# Patient Record
Sex: Female | Born: 1945 | Race: Black or African American | Hispanic: No | Smoking: Never smoker
Health system: Southern US, Community
[De-identification: ages and names within clinical notes are randomized; demographics above are authoritative.]

## PROBLEM LIST (undated history)

## (undated) DIAGNOSIS — E079 Disorder of thyroid, unspecified: Secondary | ICD-10-CM

## (undated) DIAGNOSIS — D126 Benign neoplasm of colon, unspecified: Secondary | ICD-10-CM

## (undated) DIAGNOSIS — K5730 Diverticulosis of large intestine without perforation or abscess without bleeding: Secondary | ICD-10-CM

## (undated) DIAGNOSIS — N3946 Mixed incontinence: Secondary | ICD-10-CM

## (undated) DIAGNOSIS — G4733 Obstructive sleep apnea (adult) (pediatric): Secondary | ICD-10-CM

## (undated) DIAGNOSIS — I10 Essential (primary) hypertension: Secondary | ICD-10-CM

## (undated) DIAGNOSIS — F209 Schizophrenia, unspecified: Secondary | ICD-10-CM

## (undated) DIAGNOSIS — F319 Bipolar disorder, unspecified: Secondary | ICD-10-CM

## (undated) DIAGNOSIS — I639 Cerebral infarction, unspecified: Secondary | ICD-10-CM

## (undated) HISTORY — DX: Essential (primary) hypertension: I10

## (undated) HISTORY — DX: Disorder of thyroid, unspecified: E07.9

## (undated) HISTORY — PX: MULTIPLE TOOTH EXTRACTIONS: SHX2053

## (undated) HISTORY — PX: ABDOMINAL HYSTERECTOMY: SHX81

## (undated) HISTORY — DX: Diverticulosis of large intestine without perforation or abscess without bleeding: K57.30

## (undated) HISTORY — DX: Mixed incontinence: N39.46

## (undated) HISTORY — DX: Benign neoplasm of colon, unspecified: D12.6

## (undated) HISTORY — DX: Bipolar disorder, unspecified: F31.9

## (undated) HISTORY — DX: Schizophrenia, unspecified: F20.9

## (undated) HISTORY — DX: Cerebral infarction, unspecified: I63.9

## (undated) HISTORY — PX: OTHER SURGICAL HISTORY: SHX169

## (undated) HISTORY — DX: Obstructive sleep apnea (adult) (pediatric): G47.33

---

## 2000-12-18 ENCOUNTER — Ambulatory Visit (HOSPITAL_COMMUNITY): Admission: RE | Admit: 2000-12-18 | Discharge: 2000-12-18 | Payer: Self-pay | Admitting: Family Medicine

## 2000-12-18 ENCOUNTER — Encounter: Payer: Self-pay | Admitting: Family Medicine

## 2001-12-24 ENCOUNTER — Ambulatory Visit (HOSPITAL_COMMUNITY): Admission: RE | Admit: 2001-12-24 | Discharge: 2001-12-24 | Payer: Self-pay | Admitting: Family Medicine

## 2001-12-24 ENCOUNTER — Encounter: Payer: Self-pay | Admitting: Family Medicine

## 2002-02-12 ENCOUNTER — Encounter: Payer: Self-pay | Admitting: Emergency Medicine

## 2002-02-12 ENCOUNTER — Inpatient Hospital Stay (HOSPITAL_COMMUNITY): Admission: EM | Admit: 2002-02-12 | Discharge: 2002-02-22 | Payer: Self-pay | Admitting: Psychiatry

## 2002-03-03 ENCOUNTER — Encounter: Payer: Self-pay | Admitting: Family Medicine

## 2002-03-03 ENCOUNTER — Ambulatory Visit (HOSPITAL_COMMUNITY): Admission: RE | Admit: 2002-03-03 | Discharge: 2002-03-03 | Payer: Self-pay | Admitting: Family Medicine

## 2002-12-30 ENCOUNTER — Ambulatory Visit (HOSPITAL_COMMUNITY): Admission: RE | Admit: 2002-12-30 | Discharge: 2002-12-30 | Payer: Self-pay | Admitting: Family Medicine

## 2002-12-30 ENCOUNTER — Encounter: Payer: Self-pay | Admitting: Family Medicine

## 2004-03-10 ENCOUNTER — Ambulatory Visit (HOSPITAL_COMMUNITY): Admission: RE | Admit: 2004-03-10 | Discharge: 2004-03-10 | Payer: Self-pay | Admitting: Ophthalmology

## 2004-03-15 ENCOUNTER — Ambulatory Visit (HOSPITAL_COMMUNITY): Admission: RE | Admit: 2004-03-15 | Discharge: 2004-03-15 | Payer: Self-pay | Admitting: Family Medicine

## 2005-03-13 HISTORY — PX: COLONOSCOPY: SHX174

## 2005-03-17 ENCOUNTER — Ambulatory Visit (HOSPITAL_COMMUNITY): Admission: RE | Admit: 2005-03-17 | Discharge: 2005-03-17 | Payer: Self-pay | Admitting: Family Medicine

## 2005-04-05 ENCOUNTER — Ambulatory Visit (HOSPITAL_COMMUNITY): Admission: RE | Admit: 2005-04-05 | Discharge: 2005-04-05 | Payer: Self-pay | Admitting: Family Medicine

## 2005-06-26 ENCOUNTER — Ambulatory Visit (HOSPITAL_COMMUNITY): Admission: RE | Admit: 2005-06-26 | Discharge: 2005-06-26 | Payer: Self-pay | Admitting: Ophthalmology

## 2005-11-29 ENCOUNTER — Ambulatory Visit (HOSPITAL_COMMUNITY): Admission: RE | Admit: 2005-11-29 | Discharge: 2005-11-29 | Payer: Self-pay | Admitting: Internal Medicine

## 2005-11-29 ENCOUNTER — Ambulatory Visit: Payer: Self-pay | Admitting: Internal Medicine

## 2006-03-19 ENCOUNTER — Ambulatory Visit (HOSPITAL_COMMUNITY): Admission: RE | Admit: 2006-03-19 | Discharge: 2006-03-19 | Payer: Self-pay | Admitting: Family Medicine

## 2006-12-02 ENCOUNTER — Ambulatory Visit: Admission: RE | Admit: 2006-12-02 | Discharge: 2006-12-02 | Payer: Self-pay | Admitting: Family Medicine

## 2006-12-11 ENCOUNTER — Ambulatory Visit: Payer: Self-pay | Admitting: Pulmonary Disease

## 2007-03-22 ENCOUNTER — Ambulatory Visit (HOSPITAL_COMMUNITY): Admission: RE | Admit: 2007-03-22 | Discharge: 2007-03-22 | Payer: Self-pay | Admitting: Family Medicine

## 2007-11-06 ENCOUNTER — Inpatient Hospital Stay (HOSPITAL_COMMUNITY): Admission: RE | Admit: 2007-11-06 | Discharge: 2007-11-11 | Payer: Self-pay | Admitting: Family Medicine

## 2008-03-13 DIAGNOSIS — E079 Disorder of thyroid, unspecified: Secondary | ICD-10-CM

## 2008-03-13 HISTORY — DX: Disorder of thyroid, unspecified: E07.9

## 2008-12-15 ENCOUNTER — Ambulatory Visit (HOSPITAL_COMMUNITY): Admission: RE | Admit: 2008-12-15 | Discharge: 2008-12-15 | Payer: Self-pay | Admitting: Family Medicine

## 2009-01-08 ENCOUNTER — Encounter: Payer: Self-pay | Admitting: Emergency Medicine

## 2009-01-08 ENCOUNTER — Ambulatory Visit: Payer: Self-pay | Admitting: Psychiatry

## 2009-01-09 ENCOUNTER — Inpatient Hospital Stay (HOSPITAL_COMMUNITY): Admission: AD | Admit: 2009-01-09 | Discharge: 2009-02-01 | Payer: Self-pay | Admitting: Psychiatry

## 2010-04-03 ENCOUNTER — Encounter: Payer: Self-pay | Admitting: Family Medicine

## 2010-06-15 LAB — GLUCOSE, CAPILLARY
Glucose-Capillary: 100 mg/dL — ABNORMAL HIGH (ref 70–99)
Glucose-Capillary: 104 mg/dL — ABNORMAL HIGH (ref 70–99)
Glucose-Capillary: 118 mg/dL — ABNORMAL HIGH (ref 70–99)
Glucose-Capillary: 119 mg/dL — ABNORMAL HIGH (ref 70–99)
Glucose-Capillary: 89 mg/dL (ref 70–99)
Glucose-Capillary: 91 mg/dL (ref 70–99)
Glucose-Capillary: 92 mg/dL (ref 70–99)
Glucose-Capillary: 101 mg/dL — ABNORMAL HIGH (ref 70–99)
Glucose-Capillary: 104 mg/dL — ABNORMAL HIGH (ref 70–99)
Glucose-Capillary: 104 mg/dL — ABNORMAL HIGH (ref 70–99)
Glucose-Capillary: 106 mg/dL — ABNORMAL HIGH (ref 70–99)
Glucose-Capillary: 109 mg/dL — ABNORMAL HIGH (ref 70–99)
Glucose-Capillary: 110 mg/dL — ABNORMAL HIGH (ref 70–99)
Glucose-Capillary: 114 mg/dL — ABNORMAL HIGH (ref 70–99)
Glucose-Capillary: 117 mg/dL — ABNORMAL HIGH (ref 70–99)
Glucose-Capillary: 119 mg/dL — ABNORMAL HIGH (ref 70–99)
Glucose-Capillary: 119 mg/dL — ABNORMAL HIGH (ref 70–99)
Glucose-Capillary: 120 mg/dL — ABNORMAL HIGH (ref 70–99)
Glucose-Capillary: 135 mg/dL — ABNORMAL HIGH (ref 70–99)
Glucose-Capillary: 137 mg/dL — ABNORMAL HIGH (ref 70–99)
Glucose-Capillary: 137 mg/dL — ABNORMAL HIGH (ref 70–99)
Glucose-Capillary: 75 mg/dL (ref 70–99)
Glucose-Capillary: 80 mg/dL (ref 70–99)
Glucose-Capillary: 87 mg/dL (ref 70–99)
Glucose-Capillary: 92 mg/dL (ref 70–99)
Glucose-Capillary: 93 mg/dL (ref 70–99)
Glucose-Capillary: 93 mg/dL (ref 70–99)
Glucose-Capillary: 94 mg/dL (ref 70–99)
Glucose-Capillary: 95 mg/dL (ref 70–99)
Glucose-Capillary: 96 mg/dL (ref 70–99)
Glucose-Capillary: 99 mg/dL (ref 70–99)

## 2010-06-16 LAB — URINE CULTURE: Colony Count: >=100000

## 2010-06-16 LAB — CBC
HCT: 41.6 % (ref 36.0–46.0)
MCV: 82.6 fL (ref 78.0–100.0)
Platelets: 264 10*3/uL (ref 150–400)
RDW: 15.7 % — ABNORMAL HIGH (ref 11.5–15.5)

## 2010-06-16 LAB — RAPID URINE DRUG SCREEN, HOSP PERFORMED
Amphetamines: NOT DETECTED
Benzodiazepines: NOT DETECTED
Cocaine: NOT DETECTED
Opiates: NOT DETECTED

## 2010-06-16 LAB — DIFFERENTIAL
Basophils Absolute: 0 10*3/uL (ref 0.0–0.1)
Lymphocytes Relative: 27 % (ref 12–46)
Monocytes Absolute: 0.6 10*3/uL (ref 0.1–1.0)
Neutro Abs: 5.7 10*3/uL (ref 1.7–7.7)

## 2010-06-16 LAB — COMPREHENSIVE METABOLIC PANEL
Albumin: 3.7 g/dL (ref 3.5–5.2)
BUN: 26 mg/dL — ABNORMAL HIGH (ref 6–23)
Chloride: 106 mEq/L (ref 96–112)
Creatinine, Ser: 1.57 mg/dL — ABNORMAL HIGH (ref 0.4–1.2)
Total Bilirubin: 0.3 mg/dL (ref 0.3–1.2)
Total Protein: 7 g/dL (ref 6.0–8.3)

## 2010-06-16 LAB — URINALYSIS, ROUTINE W REFLEX MICROSCOPIC
Glucose, UA: NEGATIVE mg/dL
Hgb urine dipstick: NEGATIVE
Protein, ur: NEGATIVE mg/dL

## 2010-06-16 LAB — URINE MICROSCOPIC-ADD ON

## 2010-06-16 LAB — GLUCOSE, CAPILLARY
Glucose-Capillary: 126 mg/dL — ABNORMAL HIGH (ref 70–99)
Glucose-Capillary: 188 mg/dL — ABNORMAL HIGH (ref 70–99)

## 2010-07-26 NOTE — Discharge Summary (Signed)
[name], [name]              ACCOUNT NO.:  [id number]   MEDICAL RECORD NO.:  [id number]          PATIENT TYPE:  INP   LOCATION:  A313                          FACILITY:  [facility]   PHYSICIAN:  Mila Homer. Sudie Bailey, M.D.DATE OF BIRTH:  1946/03/11   DATE OF ADMISSION:  11/06/2007  DATE OF DISCHARGE:  LH                               DISCHARGE SUMMARY   This 65 year old is admitted to the hospital with acute shortness of  breath.  She had a benign 6-day hospitalization extending from Aug 06, 2007 to November 11, 2007.  Vital signs remained stable.   Admission white cell count done through the office showed a white cell  count 12,100 of which 73% granulocytes and 12 lymphs.  BMP showed  potassium 3.1, chloride 94, glucose 196, BUN 29, creatinine 2.44, and D-  dimer 3.45.  Recheck white cell count, the third day was 8800, H&H 10.1  and 71.1.  She had a normal diff.  BMET at that time showed BUN 18,  creatinine 1.60, glucose 165, and potassium 3.5.  ABGs on admission, on  room air showed pH 7.45, pCO2 of 45, pO2 55, and bicarb 31.  O2 sat was  88% at time.   Admission chest x-ray showed cardiomegaly and mild bronchitic changes.  The repeat chest 2 days later showed what was felt to be increased  bibasilar atelectasis.  Pulmonary perfusion nuclear medicine study  showed normal perfusion.  Ventilation exam was not performed at that  time because the patient was on H1N1 precautions.   She was admitted with a diagnosis of possible pulmonary emboli.  She had  vital signs q.4 h. IV normal saline 75 mL/hour and put on Lovenox 1  mg/kg subcu q.12 h., albuterol neb treatments q.4 h. while awake,  Rocephin 1 g IV q.24 h., and Tamiflu 75 mg b.i.d., 5- day course.  She  was given O2 at 2 L to maintain her O2 sat greater than 90%.   She was also given, started the next day, Levaquin 500 mg daily and kept  on Evista 60 mg daily, simvastatin 40 mg daily, and lisinopril  hydrochlorothiazide 20/25  daily.   She was also continued on Abilify 5 mg daily, Depakote 500 mg nightly  and 250 mg b.i.d., Furosemide 40 mg daily, Metformin 1000 mg q.a.m. and  metformin 500 mg nightly, Kay Ciel 20 mEq daily, simvastatin 40 mg  daily, and amlodipine 10 mg daily.   The patient did well on this regimen.  She gradually improved.  Expiratory wheezing cleared.  She is moving air well.  The pulmonary  perfusion study came back negative.  The Lovenox was changed from b.i.d.  to just daily prophylactically.  She is ready for discharge home on her  sixth hospital day, November 11, 2007.   FINAL DISCHARGE DIAGNOSES:  1. Acute dyspnea probably secondary to viral illness.  2. Type 2 diabetes.  3. Benign essential hypertension.  4. Hyperglycemia.  5. Obesity.  6. Sleep apnea.  7. Bipolar 1 disease.  8. Hypothyroidism.   She is discharged home on Levaquin 500 mg daily for  5 days.  She is to  continue her Fosamax 70 mg weekly, Os-Cal 500 plus D b.i.d., EC-ASA 81  mg daily, furosemide 40 mg daily, Depakote 250 mg b.i.d. and Depakote  500 mg nightly, metformin ER 1000 mg q.a.m. and 500 mg nightly, Kay Ciel  20 mEq daily, Caduet 10/20 daily, lisinopril hydrochlorothiazide 20/25  daily, and Evista 60 mg daily.  Follow up with the office in 2 days.  At  that time, we will see when the results of the intranasal flu test will  be back.       Mila Homer. Sudie Bailey, M.D.  Electronically Signed     SDK/MEDQ  D:  11/11/2007  T:  11/11/2007  Job:  [id number]

## 2010-07-26 NOTE — Procedures (Signed)
NAMEMarland Kitchen  Cheryl, Parrish              ACCOUNT NO.:  [id number]   MEDICAL RECORD NO.:  [id number]          PATIENT TYPE:  OUT   LOCATION:  SLEEP LAB                     FACILITY:  [facility]   PHYSICIAN:  Barbaraann Share, MD,FCCPDATE OF BIRTH:  04-17-44   DATE OF STUDY:  12/02/2006                            NOCTURNAL POLYSOMNOGRAM   REFERRING PHYSICIAN:  Mila Homer. Sudie Bailey, M.D.   INDICATION FOR STUDY:  Hypersomnia with sleep apnea.   EPWORTH SLEEPINESS SCORE:  15.   SLEEP ARCHITECTURE:  The patient had a total sleep time of 332 minutes  with adequate slow wave sleep but decreased REM.  Sleep onset latency  was at the upper limits of normal, and REM onset was fairly rapid at 22  minutes.  Sleep efficiency was decreased at 87%.   RESPIRATORY DATA:  The patient was found to have 123 obstructive  hypopneas and 61 obstructive apneas for an apnea/hypopnea index of 33  events per hour.  The events occurred primarily in the supine position  as well as REM.  There was very loud snoring noted throughout.  The  patient did not meet split night protocol secondary to most of the  events occurring after 2 a.m.   OXYGEN DATA:  There was O2 desaturation as low as 76% with the patient's  more severe obstructive events.   CARDIAC DATA:  Rare PVC noted but no clinically significant arrhythmia.   MOVEMENT-PARASOMNIA:  None.   IMPRESSIONS-RECOMMENDATIONS:  Moderate obstructive sleep apnea/hypopnea  syndrome with an Apnea/hypopnea index of 33 events per hour and O2  desaturation as low as 76%.  The patient did not meet split night  criteria secondary to most of the events occurring after 2 a.m.  Treatment for this degree of sleep apnea should primarily focus on  weight loss as well as CPAP.  Other therapies at this severity would be  unlikely to succeed.      Barbaraann Share, MD,FCCP  Diplomate, American Board of Sleep  Medicine  Electronically Signed    K[facility]/MEDQ  D:  12/12/2006 08:44:36  T:   12/12/2006 12:17:42  Job:  [id number]

## 2010-07-26 NOTE — Group Therapy Note (Signed)
NAMEPAETON, LATOUCHE              ACCOUNT NO.:  [id number]   MEDICAL RECORD NO.:  [id number]          PATIENT TYPE:  INP   LOCATION:  A313                          FACILITY:  [facility]   PHYSICIAN:  Mila Homer. Sudie Bailey, M.D.DATE OF BIRTH:  [birth year]   DATE OF PROCEDURE:  DATE OF DISCHARGE:                                 PROGRESS NOTE   SUBJECTIVE:  Generally she is feeling better.  She says she feels like  she is coming out from under a cloud.   OBJECTIVE:  The temperature 101.4, pulse 101, respiratory rate 20, and  blood pressure 125/77.  She is sitting up.  No acute distress.  Well  developed and obese.  Lungs are clear throughout today.  She is moving  air well.  No intercostal retractions.  No use of accessory muscle  respiration.  The heart is regular rhythm, rate of 90s sitting.  There  is no edema of the ankles.  O2 sats have been 96% and 93% 2 L.   LABORATORY DATA:  White cell count today is 8800 of which 60%  neutrophils, 21 lymphs, 18 monos, H&H 10.1/31.1.  Sodium is 146, bicarb  35, glucose 165, BUN 18, creatinine 1.60 with estimated GFR of 39.   ASSESSMENT:  1. Probable pneumonia.  2. Question pulmonary emboli.   PLAN:  We are still waiting for the report on her VQ scan.  Meanwhile,  she is on Lovenox full dose and she is on IV antibiotics.  Repeat chest  x-ray is also pending.      Mila Homer. Sudie Bailey, M.D.  Electronically Signed     SDK/MEDQ  D:  11/08/2007  T:  11/08/2007  Job:  [id number]

## 2010-07-26 NOTE — Group Therapy Note (Signed)
NAMEMARITHZA, [name]              ACCOUNT NO.:  [id number]   MEDICAL RECORD NO.:  [id number]          PATIENT TYPE:  INP   LOCATION:  A313                          FACILITY:  [facility]   PHYSICIAN:  Mila Homer. Sudie Bailey, M.D.DATE OF BIRTH:  03/06/1946   DATE OF PROCEDURE:  11/09/2007  DATE OF DISCHARGE:                                 PROGRESS NOTE   SUBJECTIVE:  A 65 year old feels better.  Her sister says she is able to  talk to her more today without having the stop due to shortness of  breath.   OBJECTIVE:  Temperature 98.5, pulse 106, respiratory rate 20, blood  pressure 158/82.  She appears to be oriented and alert.  She is in no  acute distress, well developed, and obese.  She is sitting somewhat  recumbent in bed.  Skin turgor is normal.  Mucous membranes are moist.  The lungs are clear throughout today.  Still with slightly diminished  breath sounds, however.  There are no intercostal retractions.  There is  no use of accessory muscles on respiration.  Heart is regular rhythm and  rate about 90 in my exam.  There is no edema of the ankles.  O2 sats 95%  on 2 L.   ASSESSMENT:  1. Probable pulmonary emboli.  2. Question pneumonia.   PLAN:  Review of chest x-ray, which was negative.  She is due to have a  VQ scan, but this has not been done as yet.  We are trying to make sure  this does get done before discharge, we know how we need to treat her  over the next 6 months.  I discussed all this with the patient and her  sister.  Meanwhile, continue with her IV antibiotics, IV fluids, and  start getting up the chair given she is doing better.       Mila Homer. Sudie Bailey, M.D.  Electronically Signed     SDK/MEDQ  D:  11/09/2007  T:  11/10/2007  Job:  [id number]

## 2010-07-26 NOTE — Group Therapy Note (Signed)
NAMEADALY, [name]              ACCOUNT NO.:  [id number]   MEDICAL RECORD NO.:  [id number]          PATIENT TYPE:  INP   LOCATION:  A313                          FACILITY:  [facility]   PHYSICIAN:  Mila Homer. Sudie Bailey, M.D.DATE OF BIRTH:  05/24/1945   DATE OF PROCEDURE:  DATE OF DISCHARGE:                                 PROGRESS NOTE   The patient said she feels somewhat better today.  She was admitted to  the hospital yesterday after having 3 days of gradually worsening  shortness of breath.  She complained of no fever at that time, but has  now developed fever as well.  She is concerned she might have pulmonary  emboli since she did not have any real symptoms of infection.  Her renal  function precluded getting a CT scan of the chest with PE protocol, so  arrangement was made for a V/Q scan today, which has not been done yet.   The patient has also been on numerous meds at home, which have not been  started yet.  These include;  1. Fosamax 70 mg weekly.  2. Os-Cal 500 plus D b.i.d.  3. EC asa 81 mg daily.  4. Furosemide 40 mg daily.  5. Depakote 250 mg b.i.d. and 2 at bedtime.  6. Metformin ER 500 mg at night, metformin ER 500 mg 2 in the morning.  7. KCl 20 mEq daily.  8. Caduet 10/20 daily.  9. Lisinopril and hydrochlorothiazide 20/25 b.i.d.  10.Evista 60 mg daily.  11.Also on Abilify 5 mg daily.  12.Cozaar 100 mg daily.   OBJECTIVE:  Today, temperature 102.1, pulse is 97, respiratory rate 20,  blood pressure 134/70.  She is supine in bed having just finished her  breathing treatment.  She is moving air well.  Can hear, however, some  expiratory rhonchi throughout the chest, particularly in the left side.  The heart has a regular rate about 70.  Heart sounds faint, but color is  good on O2.  There is no edema of the ankles.  O2 sats 90% on 2 liters.   Her blood gas on admission showed pH 7.45, PCO2 45, PO2 55, bicarb 31.  O2 sat was 88%.   ASSESSMENT:  1. Probable  pneumonia.  2. Possible pulmonary emboli.  3. Bipolar I disease.  4. Hypothyroidism.  5. Obstructive sleep apnea.  6. Benign essential hypertension.  7. Type 2 diabetes.  8. Hypercholesterolemia  9. Obesity.  10.Osteoporosis.   We will continue her on Rocephin g IV q.24 h. and adding Levaquin 500 mg  p.o. q.24 h. in addition updating all of her home meds, so she starts  taking these as well.  V/Q scan is pending.      Mila Homer. Sudie Bailey, M.D.  Electronically Signed     SDK/MEDQ  D:  11/07/2007  T:  11/08/2007  Job:  [id number]

## 2010-07-26 NOTE — Group Therapy Note (Signed)
[name], [name]              ACCOUNT NO.:  [id number]   MEDICAL RECORD NO.:  [id number]          PATIENT TYPE:  INP   LOCATION:  A313                          FACILITY:  [facility]   PHYSICIAN:  Mila Homer. Sudie Bailey, M.D.DATE OF BIRTH:  08/20/1945   DATE OF PROCEDURE:  DATE OF DISCHARGE:                                 PROGRESS NOTE   SUBJECTIVE:  The patient is feeling a lot better today.   OBJECTIVE:  VITAL SIGNS:  Temperature 97.5, pulse 92, respiratory rate  20, blood pressure 145/95, and O2 sat is 98% on 2 liters nasal cannula.  GENERAL:  She is somewhat recumbent in bed.  She is oriented, alert,  well developed, and obese.  HEART:  Heart sounds are distant.  Her heart is running around 80.  LUNGS:  Clear throughout and moving air well.  ABDOMEN:  Soft without HSM or mass.  SKIN:  Color is good.  EXTREMITIES:  There is no edema of the ankles.   She had a perfusion study done alone yesterday, which was negative.  The  ventilation scan was not done due to concern with H1N1.   ASSESSMENT:  Given a negative perfusion scan, I think she probably has  not had pulmonary emboli even though her initial D-dimer was somewhat  elevated.  More likely, she has had some type of viral or Mycoplasma  disease.  She may have had flu.   PLAN:  We are decreasing the Lovenox just to a prophylactic dose.  Discontinue an IV.  Discussed all this with the patient and her  daughter, who is up from [place].       Mila Homer. Sudie Bailey, M.D.  Electronically Signed     SDK/MEDQ  D:  11/10/2007  T:  11/10/2007  Job:  [id number]

## 2010-07-26 NOTE — H&P (Signed)
NAMEDORISANN, [name]              ACCOUNT NO.:  [id number]   MEDICAL RECORD NO.:  [id number]          PATIENT TYPE:  INP   LOCATION:  A313                          FACILITY:  [facility]   PHYSICIAN:  Mila Homer. Sudie Bailey, M.D.DATE OF BIRTH:  Jan 15, 1946   DATE OF ADMISSION:  11/06/2007  DATE OF DISCHARGE:  LH                              HISTORY & PHYSICAL   HISTORY OF PRESENT ILLNESS:  This 65 year old woman presented the office  today having been acutely ill for 3 days.  She complained of shortness  of breath, difficulty breathing and wheezing.  She had nonproductive  cough.  Denied fever and chills.  Denied nausea, vomiting, diarrhea or  heartburn, indigestion, dyspnea on exertion or chest tightness.   She has a long and complicated medical history which includes  hypertension, obesity, manic-depressive disease, glaucoma, osteopenia,  estrogen replacement, colonic polyps, diabetes, hypercholesterolemia and  pan colonic diverticula.  Her most recent visit was August 12, 2007, at  which time she was seen for peripheral neuropathy, diabetes, bipolar  disease and sleep apnea.   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   CURRENT MEDICATIONS:  1. Alendronate 70 mg weekly.  2. KCl 20 mg daily.  3. Caduet 10/20 daily.  4. Lisinopril/HCTZ 20/25 b.i.d.  5. Evista 60 mg daily.  6. Cozaar 100 mg daily.  7. Divalproex 250 mg b.i.d. to q.h.s. through Dr. Thomasena Edis.  8. Abilify 5 mg p.o. daily through Dr. Thomasena Edis.  9. Furosemide 20 mg two daily.  10.Metformin 5 mg two in the a.m. and one in the p.m.  11.OTC ECASA 81 mg daily.  12.Caltrate 600 plus D b.i.d..   HABITS:  He has no history of cigarette smoking.   PHYSICAL EXAMINATION:  GENERAL:  When I saw her today, she appeared to  be acutely dyspneic.  VITAL SIGNS:  The BP is 110/80 with a wide cuff, pulse 84.  She was  obese but alert and oriented.  LUNGS:  She had decreased breath sounds to the lungs, but no real  rhonchi or wheezes.  HEART:  Regular rhythm rate of about 100.  ABDOMEN:  Soft and obese without organomegaly or mass.  There was no  edema of the ankles.  SKIN:  Turgor seemed to be normal.   LABORATORY DATA:  Her her blood work showed a white cell count 12,100  with 73% granulocytes, 12 lymphs, 14 monos.  Her BMP showed potassium  3.1, chloride 94, glucose 196, BUN 29, creatinine 2.44, her D-dimer was  3.45.   STUDIES:  Chest x-ray did show bronchitic changes.  Due to her renal  insufficiency, with could not get a CT with PE protocol, and therefore,  a VQ scan is pending.   ADMISSION DIAGNOSES:  1. Acute dyspnea, possibly secondary to pulmonary emboli.  2. Benign essential hypertension.  3. Type 2 diabetes.  4. Hypercholesterolemia.  5. Obesity.  6. Sleep apnea.  7. Bipolar disease.  8. Hypothyroidism.   ASSESSMENT:  In the office, we checked peak flows, and these were 221,  81 and 70 before.  Treatment  with albuterol, and after the treatment  130, 220 and 230 and improved.  Up on the floor, when she was admitted  through the hospital, she began to run a temperature of 102.   Given the above, she may just have an early pneumonia or even have  influenza.  I have instructed nursing to put her on Rocephin 1 gram IV  q.24 h.  She is on Lovenox 1 mg/kg subcu b.i.d.  The VQ scan will be  only done first thing tomorrow morning, but will keep her on oxygen and  she is getting O2 now since her O2 sat was only 88% on room air.  She  will be on albuterol neb treatments q.4 h.  I have also put her on  Tamiflu 75 mg b.i.d., again not knowing the etiology of all these  symptoms.  Will add routine medications in the a.m.      Mila Homer. Sudie Bailey, M.D.  Electronically Signed     SDK/MEDQ  D:  11/06/2007  T:  11/07/2007  Job:  [id number]

## 2010-07-29 NOTE — Op Note (Signed)
NAME:  [name], [name]              ACCOUNT NO.:  [id number]   MEDICAL RECORD NO.:  [id number]          PATIENT TYPE:  AMB   LOCATION:  DAY                           FACILITY:  [facility]   PHYSICIAN:  R. Roetta Sessions, M.D. DATE OF BIRTH:  [birth year]   DATE OF PROCEDURE:  11/29/2005  DATE OF DISCHARGE:                                 OPERATIVE REPORT   PROCEDURE PERFORMED:  Colonoscopy.   INDICATIONS FOR PROCEDURE:  The patient is a  65 year old lady with history  of colonic adenomatous polyps removed back in 2002.  She is devoid of any  lower GI tract symptoms.  She is here for surveillance.  There is no family  history of colorectal neoplasia.  Risks, benefits and alternatives of this  approach have been reviewed.   PROCEDURE NOTE:  Oxygen saturations, blood pressure, pulse and respirations  were monitored throughout the entire procedure.  Conscious sedation Versed 3  mg IV, Demerol 75 mg IV in divided doses.   INSTRUMENT USED:  Olympus video chip system.   FINDINGS:  Digital exam revealed no abnormalities.   ENDOSCOPIC FINDINGS:  Prep was adequate.   Rectum:  Examination of the rectal mucosa including retroflex view of the  anal verge revealed no abnormalities.  Colon:  Colonic mucosa was surveyed from the rectosigmoid junction to the  left, transverse and right colon to the area of the appendiceal orifice and  ileocecal valve and cecum.  These structures were well seen and photographed  for the record.  From this level, the scope was slowly and cautiously  withdrawn.  All previously mentioned mucosal surfaces were again seen.  The  patient had numerous small mouthed diverticula throughout the colon, all the  way to the cecum.  However, the remainder of the colonic mucosa appeared  entirely normal.  The patient tolerated the procedure well, was reacted in  endoscopy.   IMPRESSION:  1. Normal rectum.  2. Pancolonic diverticula.  Remainder of colonic mucosa appeared normal.   RECOMMENDATIONS:  1. Diverticulosis literature provided to [name].  2. Repeat surveillance colonoscopy in 5 years.      Jonathon Bellows, M.D.  Electronically Signed     RMR/MEDQ  D:  11/29/2005  T:  11/30/2005  Job:  [id number]   cc:   Mila Homer. Sudie Bailey, M.D.  Fax: [phone]

## 2010-07-29 NOTE — H&P (Signed)
NAME:  [name], [name]                        ACCOUNT NO.:  [id number]   MEDICAL RECORD NO.:  [id number]                   PATIENT TYPE:  IPS   LOCATION:  0406                                 FACILITY:  [facility]   PHYSICIAN:  Geoffery Lyons, M.D.                   DATE OF BIRTH:  Aug 30, 1945   DATE OF ADMISSION:  02/12/2002  DATE OF DISCHARGE:                         PSYCHIATRIC ADMISSION ASSESSMENT   IDENTIFYING INFORMATION:  This is a 65 year old African-American female who  is divorced, voluntary admission.   HISTORY OF PRESENT ILLNESS:  This 65 year old female presented in the  emergency room accompanied by her sister after taking off her clothes  outdoors and running through the woods.  She reports that she had stopped  her Depakote approximately 3 weeks that she takes for her bipolar illness  because I wanted to think better.  She had been taking her pills and  wrapping them up in pieces of bread so that they would be thrown away and  hiding the medicines in her mouth and later spitting them out.  Today, her  mood is elevated, her insight is quite impaired, her speech is rapid,  pressured and tangential.  She has displayed no overt suicidal or homicidal  ideation.  She is directable.   PAST PSYCHIATRIC HISTORY:  The patient is followed at [facility] and is apparently compliant with her appointments.  This is her first admission to [facility].  She  does report that she was previously treated with lithium in the distant  past, denies any recent hospitalizations, although her hospitalization  history is somewhat unclear.   SOCIAL HISTORY:  The patient is unemployed.  She lives in public housing  with her 50 year old daughter and is satisfied with her current  circumstances.  She does state she is a bit stressed by having to go up and  down stairs to get to the bathroom since she is on many diuretics for her  hypertension, but  she denies any true psychosocial stressors or problems, no  legal charges.   FAMILY HISTORY:  Unclear.   ALCOHOL AND DRUG HISTORY:  The patient denies any history of substance  abuse.  There is no evidence of prior substance abuse.   PAST MEDICAL HISTORY:  The patient is followed by Dr. Sudie Bailey who is her  primary care physician and by Dr. Grier Mitts without is her  ophthalmologist.  The patient's medical problems include hypertension and  glaucoma.  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   MEDICATIONS:  Depakote 250 mg p.o. t.i.d.,  Accupril 40 mg daily, Demodex 20  mg daily, Fosamax 70 mg q.week taken on Sundays, Evista 60 mg daily, ASA 81  mg daily, Procardia XL 60 mg daily, K-Dur 20 mEq daily, hydrochlorothiazide  25 mg daily, and Cosopt eyedrops 1  in the right eye b.i.d. and the patient  also in the past has taken Haldol 1 mg p.o. q.h.s. but has not apparently  taken this since September according to her pharmacy.  She obtains her  medications at the [facility] in [place], and we confirmed the  medications with the pharmacist.   DRUG ALLERGIES:  None.   POSITIVE PHYSICAL FINDINGS:  The patient's physical examination was done in  the emergency room at [facility] and is documented in the record by  Dr. Greta Doom.  She was given 40 mEq of potassium at that time.   LABORATORY DATA:  The patient's diagnostic studies reveal CBC within normal  limits.  Her WBC is 9,400, hemoglobin 12.8, hematocrit 38.9, her MCV is  82.6, platelets 267,000.  Her chemistry reveals that she was hypokalemic at  3.0 on a scale of 3.5-5.5, and she was treated with 40 mEq of potassium in  the emergency room.  Her glucose was somewhat elevated at 123.  Her BUN was  28 and her creatinine 1.5.  Her alcohol was less than 5.  Her urine drug  screen was negative for substances of abuse.  Her comprehensive metabolic  panel is currently pending as  is her thyroid panel and her valporic acid  level.  On admission to the unit, her vital signs were within normal limits.   MENTAL STATUS EXAM:  This is an overweight female with a very bright affect.  She has a quick smile.  She is mildly disheveled.  She looks directly at me  and immediately starts with rapid-fire questions about who am I, what am I  there.  Speech is rapid, pressured.  She is directable, generally pleasant.  Mood is elevated and euphoric.  Thought process is rapid.  She has flight of  ideas and some tangential thinking.  Her insight is impaired as is also her  judgment.  Generally, she is intact to person and place but is not clear on  the date or exactly what her circumstances are.  She is kind of at a loss to  explain any reason why she took her clothes off last night or why she  stopped her medicine awhile back, other than to say that she just wanted to  think better and she was tired of taking it.   ADMISSION DIAGNOSIS:   AXIS I:  Bipolar I disorder, manic.   AXIS II:  Deferred.   AXIS III:  Hypertension, glaucoma.   AXIS IV:  Deferred.   AXIS V:  Current 22, past year 36.   INITIAL PLAN OF CARE:  Voluntarily admit the patient with q.15 minute checks  in place and she is on intensive care program for close observation, and we  have placed her on fall precautions and will encourage fluids and other p.o.  intake.  Meanwhile, we will monitor her fluid intake for awhile and recheck  her BUN and creatinine to make sure that that is not trending unfavorably.  Meanwhile, we will restart her routine medications and restart her Depakote  at 250 mg p.o. b.i.d.  In the long term, we will plan on having her follow  up with [facility] and we will have the case manager  explore her family situation and any concerns that they might have and determine the support level.  We have discussed this plan with the patient  and she is in full agreement and at  this point is cooperative with care.  ESTIMATED LENGTH OF STAY:  5-6 days.      Margaret A. Scott, N.P.                   Geoffery Lyons, M.D.    MAS/MEDQ  D:  02/12/2002  T:  02/12/2002  Job:  [id number]

## 2010-07-29 NOTE — Discharge Summary (Signed)
NAME:  [name], [name]                        ACCOUNT NO.:  [id number]   MEDICAL RECORD NO.:  [id number]                   PATIENT TYPE:  IPS   LOCATION:  0406                                 FACILITY:  [facility]   PHYSICIAN:  Geoffery Lyons, M.D.                   DATE OF BIRTH:  1946/01/12   DATE OF ADMISSION:  02/12/2002  DATE OF DISCHARGE:  02/22/2002                                 DISCHARGE SUMMARY   CHIEF COMPLAINT AND PRESENT ILLNESS:  This was the first admission to [facility] for this 65 year old African-American female,  divorced, voluntarily admitted.  Presented to the emergency room with a  complaint by sister taking off her clothes outdoors and running through the  woods.  Reported she had stopped her Depakote three weeks prior to this  admission for bipolar illness because she wanted to think better.  Taking  her pills, wrapping them up in pieces of bread so they will be thrown away  and the medicine is in the mouth and later spitting them up.  Upon  admission, elevated mood, rapid speech, pressured, tangential.   PAST PSYCHIATRIC HISTORY:  [facility].  First  time [employer].   ALCOHOL/DRUG HISTORY:  Denies the use or abuse of any substances.   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   MEDICATIONS:  Depakote 250 mg three times a day, Accupril 40 mg daily,  Demadex 20 mg daily, Fosamax 70 mg every week, Evista 60 mg daily, Procardia  XL 60 mg daily, potassium 20 mEq daily, hydrochlorothiazide 25 mg daily,  Cosopt eye drops 1 twice a day, Haldol 1 mg at night.   PHYSICAL EXAMINATION:  Performed and failed to show any acute findings.   MENTAL STATUS EXAM:  Upon admission revealed an overweight female with very  bright affect, expansive, quick smile, disheveled.  Looks directly at the  interviewer and merely starts rapid-fired questions.  Speech is rapid,  pressured, directible, pleasant.  Mood is elevated and euphoric.  Thought  processes are rapid flight of ideas and tangential thinking.  Insight is  impaired.  __________ judgment.  Cognition well-preserved.   ADMISSION DIAGNOSES:   AXIS I:  Bipolar disorder, type 1, manic.   AXIS II:  No diagnosis.   AXIS III:  1. Hypertension.  2. Glaucoma.  3. Prosthetic eye.   AXIS IV:  Moderate.   AXIS V:  Global Assessment of Functioning upon admission 22; highest Global  Assessment of Functioning in the last year 68.   LABORATORY DATA:  Blood chemistries were followed from February 12, 2002 to  February 22, 2002.  Potassium went from 3.1 to 4.1, glucose from 157 to 103,  BUN from 30 to 18.  Liver profile was within normal limits.  Thyroid profile  was within normal limits.  Drug screen was  negative for substances of abuse.   HOSPITAL COURSE:  She was started in intensive individual and group  psychotherapy.  She was kept on the Accupril, Demadex, potassium,  hydrochlorothiazide, Procardia and Depakote 250 mg three times a day and  Fosamax.  She was given some Zyprexa Zydis as needed.  Depakote was  increased to 250 mg twice a day and 500 mg at night.  Placed on Risperdal  and was increased to 0.5 mg twice a day and 2 at night.  It was felt that  Risperdal was not working as well so she was placed on Zyprexa 2.5 mg twice  a day and 5 mg at bedtime.  Zyprexa was eventually increased to 2.5 mg twice  a day and 10 mg at night.  Through the stay, she evidenced some paranoia,  suspicions of people's motives.  Required verification of identity.  Irritable.  She was bleeding from the eye and she threw the prosthesis.  The  staff was able to find the prostheses in one of the trash cans.  As she took  the medication, she claimed that she started feeling better, started  sleeping better but she was having some erratic, unpredictable behavior.  Episode of irritability, suspiciousness.  Once the  Zyprexa was added and  optimized, she started evidencing more euthymia.  She was followed by  internal medicine for control of her blood pressure.  Continued to evidence  decreased irritability, easier to accept the medication.  Mood was euthymic.  Signed 72 hours against medical advice .  There was a family conference  scheduled.  On February 22, 2002, she was wanting to be discharged against  medical advice.  Increased insight in terms of the need of taking  medication, wanting to take it but was not willing to sign herself back in  and was wanting to be discharged.  As there was no suicidal ideation, no  homicidal ideation, no evidence of acute psychosis and she was markedly  improved on admission.  The family felt that she was 75-80 percent improved.  We went ahead and discharged to outpatient follow-up.   DISCHARGE DIAGNOSES:   AXIS I:  Bipolar disorder, manic.   AXIS II:  No diagnosis.   AXIS III:  1. Glaucoma.  2. Hypertension.  3. Prosthetic eye.   AXIS IV:  Moderate.   AXIS V:  Global Assessment of Functioning upon discharge 50.   DISCHARGE MEDICATIONS:  1. Accupril 40 mg daily.  2. K-Dur 20 mEq daily.  3. Procardia XL 60 mg daily.  4. Evista 60 mg daily.  5. Cosopt ophthalmic solution.  6. Depakote 250 mg twice a day and 500 mg at night.  7.     Zyprexa 2.5 mg twice a day and 10 mg at bedtime.  8. Advair inhaler.   FOLLOW UP:  [facility].                                               Geoffery Lyons, M.D.    IL/MEDQ  D:  03/26/2002  T:  03/27/2002  Job:  [id number]

## 2010-11-10 ENCOUNTER — Encounter: Payer: Self-pay | Admitting: Family Medicine

## 2010-11-10 ENCOUNTER — Ambulatory Visit (INDEPENDENT_AMBULATORY_CARE_PROVIDER_SITE_OTHER): Payer: Medicare Other | Admitting: Family Medicine

## 2010-11-10 VITALS — BP 150/88 | HR 69 | Resp 16 | Ht 61.5 in | Wt 214.1 lb

## 2010-11-10 DIAGNOSIS — R609 Edema, unspecified: Secondary | ICD-10-CM

## 2010-11-10 DIAGNOSIS — I10 Essential (primary) hypertension: Secondary | ICD-10-CM | POA: Insufficient documentation

## 2010-11-10 DIAGNOSIS — R600 Localized edema: Secondary | ICD-10-CM

## 2010-11-10 DIAGNOSIS — E039 Hypothyroidism, unspecified: Secondary | ICD-10-CM | POA: Insufficient documentation

## 2010-11-10 DIAGNOSIS — E119 Type 2 diabetes mellitus without complications: Secondary | ICD-10-CM | POA: Insufficient documentation

## 2010-11-10 DIAGNOSIS — F209 Schizophrenia, unspecified: Secondary | ICD-10-CM

## 2010-11-10 DIAGNOSIS — E669 Obesity, unspecified: Secondary | ICD-10-CM

## 2010-11-10 MED ORDER — FUROSEMIDE 20 MG PO TABS: 20.0000 mg | ORAL_TABLET | Freq: Every day | ORAL | Status: DC

## 2010-11-10 NOTE — Patient Instructions (Addendum)
See instructions on diet and vital signs Start the lasix for your leg swelling I will discuss with Bard Herbert about the specialist Start the walking program F/u in 2 weeks

## 2010-11-10 NOTE — Progress Notes (Signed)
  Subjective:    Patient ID: [name], female    DOB: 1945-04-26, 65 y.o.   MRN: [id number]  HPI Pt here to establish care, medications and history reviewed that was given, she lives in a group home.  [facility] currently, previously at a place in [place]  Has sister in [place]    Leg swelling- on and off for the past month, has not seen an MD regulary in past 2 years, since she has been at the assisted living on and off   She thinks she is on a fluid pill, has pain from the swelling in both her feet   Glaucoma- had left eye removed by [facility])  11 years ago, currently seeing an eye doctor but needs to see Dr. Larry Sierras again   Diabetes Mellitus- has borderline diabetes, they take her blood sugar every wed, past few weeks (110- 140's)    HTN- approx 10 years, runs in her family,     Weight gain- has gained 40 pounds  Depression/Bipolar - hospitlized in 2010 for manic depression , now states she has been diagnosed with Schizophrenia Due for Colonoscopy had pan diverticula in fEB  2007   [facility]- [phone] / home # [phone] ([facility]) Review of Systems    GEN- denies fatigue, fever, weight loss,weakness, recent illness CVS- denies chest pain, palpitations, +leg edema RESP- denies SOB, cough, wheeze ABD- denies N/V, change in stools, abd pain MSK- +joint pain, muscle aches, injury Neuro- denies headache, dizziness, syncope, seizure activity      Objective:   Physical Exam  GEN- NAD, alert and oriented , pleasant   CVS- RRR, no murmur   RESP-CTAB   EXT- 1+pitting edema bilat      Shiny skin on lower ext   Pulses 1+ bilat       Assessment & Plan:   I will need to discuss with the owner how the specialist work. Psychiatrist- unsure if she will continue to see pysch in [facility]

## 2010-11-12 DIAGNOSIS — E669 Obesity, unspecified: Secondary | ICD-10-CM | POA: Insufficient documentation

## 2010-11-12 DIAGNOSIS — F209 Schizophrenia, unspecified: Secondary | ICD-10-CM | POA: Insufficient documentation

## 2010-11-12 NOTE — Assessment & Plan Note (Signed)
Encourage walking. GIven low sodium/diabetic diet

## 2010-11-12 NOTE — Assessment & Plan Note (Signed)
She will need continued f/u with psych for med management

## 2010-11-12 NOTE — Assessment & Plan Note (Addendum)
Check TSH, no meds currently

## 2010-11-12 NOTE — Assessment & Plan Note (Signed)
Elevated blood pressure, on ACEI, additional lasix added for diruretic affect

## 2010-11-12 NOTE — Assessment & Plan Note (Signed)
Will start daily low dose lasix, BP will tolerate this

## 2010-11-12 NOTE — Assessment & Plan Note (Signed)
Obtain A1C, continue Glipizide

## 2010-11-16 ENCOUNTER — Telehealth: Payer: Self-pay | Admitting: Family Medicine

## 2010-11-16 DIAGNOSIS — F209 Schizophrenia, unspecified: Secondary | ICD-10-CM

## 2010-11-16 NOTE — Telephone Encounter (Signed)
I called the owner of the facilty and left a voice message My concerns are:   1. Will Ms. Perella continue to see Psych in [place] or does she need a new psychiatrist in [place]?   2. Can they tranport her to [facility] or does she need a new eye doctor?   3. Does a podiatrist or any other specialist come to the home.?

## 2010-11-17 LAB — CBC
HCT: 40.4 % (ref 36.0–46.0)
Hemoglobin: 13.2 g/dL (ref 12.0–15.0)
MCV: 81.9 fL (ref 78.0–100.0)
RBC: 4.93 MIL/uL (ref 3.87–5.11)
WBC: 7.7 10*3/uL (ref 4.0–10.5)

## 2010-11-17 LAB — COMPREHENSIVE METABOLIC PANEL
ALT: 15 U/L (ref 0–35)
AST: 14 U/L (ref 0–37)
Alkaline Phosphatase: 76 U/L (ref 39–117)
Chloride: 106 mEq/L (ref 96–112)
Creat: 1.12 mg/dL — ABNORMAL HIGH (ref 0.50–1.10)
Total Bilirubin: 0.3 mg/dL (ref 0.3–1.2)

## 2010-11-17 LAB — LIPID PANEL
HDL: 48 mg/dL (ref >39)
LDL Cholesterol: 111 mg/dL — ABNORMAL HIGH (ref 0–99)
Total CHOL/HDL Ratio: 3.8 Ratio
Triglycerides: 120 mg/dL (ref <150)
VLDL: 24 mg/dL (ref 0–40)

## 2010-11-17 LAB — TSH: TSH: 0.343 u[IU]/mL — ABNORMAL LOW (ref 0.350–4.500)

## 2010-11-21 NOTE — Progress Notes (Signed)
Mailed out a letter to patient because it is time for her to schedule a colonoscopy but it was return to sender so the I tried to call the patient to get another address but the phone number we have does not work.

## 2010-11-21 NOTE — Telephone Encounter (Signed)
I called  [name]  [phone]      [facility] #) [phone] [name] family does not know any information about the previous pyschiatrist or where this is located. They would like her referred to [facility] [name]- owner of facility will be scheduling an appt with the eye doctor Her leg edema is improved

## 2010-11-24 ENCOUNTER — Encounter: Payer: Self-pay | Admitting: Family Medicine

## 2010-11-24 ENCOUNTER — Ambulatory Visit (INDEPENDENT_AMBULATORY_CARE_PROVIDER_SITE_OTHER): Payer: Medicare Other | Admitting: Family Medicine

## 2010-11-24 DIAGNOSIS — I10 Essential (primary) hypertension: Secondary | ICD-10-CM

## 2010-11-24 DIAGNOSIS — L602 Onychogryphosis: Secondary | ICD-10-CM

## 2010-11-24 DIAGNOSIS — E039 Hypothyroidism, unspecified: Secondary | ICD-10-CM

## 2010-11-24 DIAGNOSIS — E119 Type 2 diabetes mellitus without complications: Secondary | ICD-10-CM

## 2010-11-24 DIAGNOSIS — R609 Edema, unspecified: Secondary | ICD-10-CM

## 2010-11-24 DIAGNOSIS — R600 Localized edema: Secondary | ICD-10-CM

## 2010-11-24 DIAGNOSIS — Z23 Encounter for immunization: Secondary | ICD-10-CM

## 2010-11-24 DIAGNOSIS — E785 Hyperlipidemia, unspecified: Secondary | ICD-10-CM

## 2010-11-24 MED ORDER — CARVEDILOL 6.25 MG PO TABS: 6.2500 mg | ORAL_TABLET | Freq: Two times a day (BID) | ORAL | Status: DC

## 2010-11-24 MED ORDER — INFLUENZA VAC TYPES A & B PF IM SUSP: 0.5000 mL | Freq: Once | INTRAMUSCULAR | Status: DC

## 2010-11-24 MED ORDER — PRAVASTATIN SODIUM 20 MG PO TABS: 20.0000 mg | ORAL_TABLET | Freq: Every evening | ORAL | Status: DC

## 2010-11-24 NOTE — Patient Instructions (Addendum)
If you have muscle aches with the new cholesterol pill please let me know No canned food Please get the thyroid test rechecked in 6 weeks I have increased the strength of your blood pressure pill- Coreg, the new dose is 6.25mg  twice a day Keep up the good work with your walking You have been given your flu shot Call [facility] for your colonoscopy- [phone] I will send you to a podiatrist (foot doctor) Next visit 4 weeks

## 2010-11-24 NOTE — Assessment & Plan Note (Signed)
Will increase Coreg 6.25mg  twice a day,heart rate has space for increase, currently at max on CCB and ACEI

## 2010-11-24 NOTE — Assessment & Plan Note (Signed)
Goal < 100, as hypertensive and diabetic. Start low  Dose statin. Discussed side effects. Family to schedule eye doctor, per conversation with group home owner

## 2010-11-24 NOTE — Progress Notes (Signed)
  Subjective:    Patient ID: [name], female    DOB: August 27, 1945, 65 y.o.   MRN: [id number]  HPI  Pt here to f/u DM, HTN and labs, needs referral to podiatry  DM- blood sugars running 117-124 fasting, no hypoglycemia episodes, tolerating meds. ROS- polyuria, A1C 6.3%  HTN- blood pressure high at ALF home 171/97, states they have a small cuff and her readings are higher, no CP, no SOB, reviewed labs renal function wnl, LDL elevated at 111.  Leg swelling-improved with use of lasix Reviwed blood work- thyroid function borderline abnormal.  ROS- denies cold intolerance, heat intolerance, CP, SOB, excessively dry skin  Review of Systems  Per above      Objective:   Physical Exam    GEN- NAD, alert and oriented , pleasant, vision from right eye only, overweight    Neck- no thryoid nodule   CVS- RRR, no murmur   RESP-CTAB   EXT- trace edema bilat   Feet- no open sore, sensation in tact, thick long nails       Assessment & Plan:

## 2010-11-24 NOTE — Assessment & Plan Note (Signed)
improved

## 2010-11-24 NOTE — Assessment & Plan Note (Signed)
Recheck labs in 6-8 weeks, borderline TSH

## 2010-11-24 NOTE — Assessment & Plan Note (Signed)
No change to meds, A1C within goal

## 2010-12-07 ENCOUNTER — Telehealth: Payer: Self-pay | Admitting: Family Medicine

## 2010-12-07 NOTE — Telephone Encounter (Signed)
PATIENT IS REQUESTING REFILL ON FANAPT 4MG  AND PERPHENAZINE 4MG . I AM NOT FAMILIAR WITH THESE MEDS. ARE YOU PRESCRIBING? OKAY TO REFILL?

## 2010-12-08 NOTE — Telephone Encounter (Signed)
I do not prescribe those medications, they are from the psychiatrist, they have to call her psychiatrist in order to get refills. These are not routine psych medications that I would prescribe

## 2010-12-08 NOTE — Telephone Encounter (Signed)
noted 

## 2010-12-15 ENCOUNTER — Telehealth: Payer: Self-pay

## 2010-12-15 MED ORDER — NYSTATIN 100000 UNIT/GM EX CREA: TOPICAL_CREAM | Freq: Two times a day (BID) | CUTANEOUS | Status: DC

## 2010-12-15 NOTE — Telephone Encounter (Signed)
Yeast cream sent in.

## 2010-12-15 NOTE — Telephone Encounter (Signed)
Was seeing Mrs. Jarold Motto and she wanted you to know she has got yeast under both breasts and in groin area. Can she have something sent in for it, thanks

## 2010-12-23 ENCOUNTER — Encounter: Payer: Self-pay | Admitting: Family Medicine

## 2010-12-23 ENCOUNTER — Ambulatory Visit (INDEPENDENT_AMBULATORY_CARE_PROVIDER_SITE_OTHER): Payer: Medicare Other | Admitting: Family Medicine

## 2010-12-23 VITALS — BP 128/80 | HR 93 | Resp 16 | Ht 61.5 in | Wt 216.1 lb

## 2010-12-23 DIAGNOSIS — F209 Schizophrenia, unspecified: Secondary | ICD-10-CM

## 2010-12-23 DIAGNOSIS — B372 Candidiasis of skin and nail: Secondary | ICD-10-CM

## 2010-12-23 DIAGNOSIS — E785 Hyperlipidemia, unspecified: Secondary | ICD-10-CM

## 2010-12-23 DIAGNOSIS — E039 Hypothyroidism, unspecified: Secondary | ICD-10-CM

## 2010-12-23 DIAGNOSIS — I10 Essential (primary) hypertension: Secondary | ICD-10-CM

## 2010-12-23 NOTE — Assessment & Plan Note (Signed)
Blood pressure has improved. Patient states her home health nurse also he has good values for her blood pressure. Will  continue current regimen

## 2010-12-23 NOTE — Assessment & Plan Note (Signed)
Continue nystatin to groin area, advised pt to keep area clean and dry to prevent re-occurrence

## 2010-12-23 NOTE — Progress Notes (Signed)
  Subjective:    Patient ID: [name], female    DOB: [birth year], 65 y.o.   MRN: [id number]  HPI Has not heard from psychiatrist- referral went to [facility]- pt meds were being prescribed by Dr. Malvin Johns at [facility] psychiatry, she has not missed any doses according to [facility]-missed appt last week needs to be rescheduled- Dr. Pricilla Holm Rash- home called in for rash beneath breast and in groin region, pt has been using the nystatin cream prescribed, feels area beneath breast is gone, still has rash on groin region and inner thighs  Hyperlipidemia-Taking cholesterol pill without problem, ROS-  no muscle aches, no N/V HTN- toelarting increased dose of Coreg-ROS- no chest pain, no dizziness, not feeling light headed, leg swelling has been staying down   Review of Systems - per above      Objective:   Physical Exam GEN- NAD, alert and oriented , pleasant, vision from right eye only, overweight    CVS- RRR, no murmur   RESP-CTAB   ABD- NABS, soft, NT ND   Skin- mild erythema beneath bilat breast, erythema along groin crease, moist appearing, no specific rash, hyperpigemented skin in both these areas   EXT- pedal edema bilat       Assessment & Plan:

## 2010-12-23 NOTE — Assessment & Plan Note (Signed)
I will followup with psychiatry referral. Patient needs to be seen as soon as possible to continue with her medications. She appears stable today.

## 2010-12-23 NOTE — Patient Instructions (Signed)
F/U in 3 months Please get your labs drawn in 2 weeks, we will call with results Reschedule your foot doctor appt with Dr. Pricilla Holm, you missed the first one on October 3rd I will follow-up with the psychiatrist- if you do not hear from [place] please call in 1 week Continue current medications Continue cream-Nystatin to groin/thigh region for at least another week

## 2010-12-23 NOTE — Assessment & Plan Note (Signed)
Tolerating statin - continue.  

## 2010-12-23 NOTE — Assessment & Plan Note (Signed)
Recheck labs in 2 weeks

## 2010-12-27 ENCOUNTER — Other Ambulatory Visit: Payer: Self-pay | Admitting: Family Medicine

## 2010-12-28 ENCOUNTER — Other Ambulatory Visit: Payer: Self-pay | Admitting: Family Medicine

## 2010-12-28 ENCOUNTER — Telehealth: Payer: Self-pay | Admitting: Family Medicine

## 2010-12-28 NOTE — Telephone Encounter (Signed)
I spoke with [name] and families they had the pt referral however the number they had was disconnected. I gave her [name]'s number and the one on the chart we had, she will call and schedule and appt Also I received a request for her psych meds, which I do not prescribe, I called the pharmacy they sent a request to the prescribing physician yesterday.

## 2011-01-02 ENCOUNTER — Telehealth: Payer: Self-pay | Admitting: Family Medicine

## 2011-01-04 ENCOUNTER — Other Ambulatory Visit: Payer: Self-pay | Admitting: Family Medicine

## 2011-01-05 NOTE — Telephone Encounter (Signed)
I have spoken with the pharmacy and the patient during visits. I am not filling the med. The pharmacy had sent the information to the doctor that prescribed it, when I spoke with them.  If you speak with them, see when there new appt is with [facility]

## 2011-01-05 NOTE — Telephone Encounter (Signed)
Are you refilling her Fanapt and perphenazine? Or send to psych?

## 2011-01-06 LAB — T4, FREE: Free T4: 0.96 ng/dL (ref 0.80–1.80)

## 2011-01-06 LAB — T3, FREE: T3, Free: 2.9 pg/mL (ref 2.3–4.2)

## 2011-03-16 DIAGNOSIS — L600 Ingrowing nail: Secondary | ICD-10-CM | POA: Diagnosis not present

## 2011-03-16 DIAGNOSIS — M79609 Pain in unspecified limb: Secondary | ICD-10-CM | POA: Diagnosis not present

## 2011-03-28 DIAGNOSIS — IMO0002 Reserved for concepts with insufficient information to code with codable children: Secondary | ICD-10-CM | POA: Diagnosis not present

## 2011-03-28 DIAGNOSIS — F312 Bipolar disorder, current episode manic severe with psychotic features: Secondary | ICD-10-CM | POA: Diagnosis not present

## 2011-03-29 ENCOUNTER — Telehealth: Payer: Self-pay

## 2011-03-29 NOTE — Telephone Encounter (Signed)
Take CBG q weekly fasting and please bring to office visits

## 2011-03-29 NOTE — Telephone Encounter (Signed)
Order faxed to [facility] in [place].

## 2011-03-30 DIAGNOSIS — M79609 Pain in unspecified limb: Secondary | ICD-10-CM | POA: Diagnosis not present

## 2011-03-30 DIAGNOSIS — L600 Ingrowing nail: Secondary | ICD-10-CM | POA: Diagnosis not present

## 2011-04-03 ENCOUNTER — Telehealth: Payer: Self-pay | Admitting: Family Medicine

## 2011-04-03 NOTE — Telephone Encounter (Signed)
She is a patient of [facility], she was suppose to have this scheduled in Sept based on our visit.They need to call and schedule a colonoscopy. If they have a problem doing this have them cal [place] back

## 2011-04-03 NOTE — Telephone Encounter (Signed)
[facility] aware to call and schedule

## 2011-04-05 ENCOUNTER — Telehealth: Payer: Self-pay

## 2011-04-05 NOTE — Telephone Encounter (Signed)
Phone call from Diane at the home said that Dr. Jeanice Lim requested pt have a colonoscopy. Pt is schizophrenic and an OV has been scheduled for  04/12/2011 at 11:00 AM with Tana Coast, PA .

## 2011-04-06 DIAGNOSIS — Z79899 Other long term (current) drug therapy: Secondary | ICD-10-CM | POA: Diagnosis not present

## 2011-04-06 DIAGNOSIS — F312 Bipolar disorder, current episode manic severe with psychotic features: Secondary | ICD-10-CM | POA: Diagnosis not present

## 2011-04-10 ENCOUNTER — Other Ambulatory Visit: Payer: Self-pay | Admitting: Family Medicine

## 2011-04-11 ENCOUNTER — Ambulatory Visit (INDEPENDENT_AMBULATORY_CARE_PROVIDER_SITE_OTHER): Payer: Medicare Other | Admitting: Family Medicine

## 2011-04-11 ENCOUNTER — Encounter: Payer: Self-pay | Admitting: Family Medicine

## 2011-04-11 DIAGNOSIS — R609 Edema, unspecified: Secondary | ICD-10-CM

## 2011-04-11 DIAGNOSIS — E785 Hyperlipidemia, unspecified: Secondary | ICD-10-CM

## 2011-04-11 DIAGNOSIS — E119 Type 2 diabetes mellitus without complications: Secondary | ICD-10-CM

## 2011-04-11 DIAGNOSIS — Z1239 Encounter for other screening for malignant neoplasm of breast: Secondary | ICD-10-CM

## 2011-04-11 DIAGNOSIS — I10 Essential (primary) hypertension: Secondary | ICD-10-CM

## 2011-04-11 DIAGNOSIS — R600 Localized edema: Secondary | ICD-10-CM

## 2011-04-11 DIAGNOSIS — F209 Schizophrenia, unspecified: Secondary | ICD-10-CM

## 2011-04-11 DIAGNOSIS — M25519 Pain in unspecified shoulder: Secondary | ICD-10-CM

## 2011-04-11 DIAGNOSIS — L600 Ingrowing nail: Secondary | ICD-10-CM

## 2011-04-11 DIAGNOSIS — E669 Obesity, unspecified: Secondary | ICD-10-CM

## 2011-04-11 MED ORDER — TRAMADOL HCL 50 MG PO TABS: 50.0000 mg | ORAL_TABLET | Freq: Two times a day (BID) | ORAL | Status: AC | PRN

## 2011-04-11 NOTE — Progress Notes (Signed)
  Subjective:    Patient ID: [name], female    DOB: October 18, 1945, 66 y.o.   MRN: [id number]  HPI Pt here for routine follow-up    Dr. Pricilla Holm- seen by  Podiatrist, has f/u on Feb 7  [facility]- Dr. Inda Merlin is psychiatrist, they plan to reduce some medications, currently on topamax, fanapt and Trilafon , had some labs drawn by psychiatrist  Colonoscopy- has GI appt tomorrow  Leg swelling doing well  Hyperpilipida- taking Pravastatin  HTN- no difficulty with blood pressure meds  DM- 130-200 fasting q weekly ,. 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 Cold weather makes it worse, no injury     Review of Systems    GEN- denies fatigue, fever, weight loss,weakness, recent illness HEENT- denies eye drainage, change in vision, nasal discharge, CVS- denies chest pain, palpitations RESP- denies SOB, cough, wheeze ABD- denies N/V, change in stools, abd pain GU- denies dysuria, hematuria, dribbling,+ incontinence MSK- + joint pain, muscle aches, injury Neuro- denies headache, dizziness, syncope, seizure activity       Objective:   Physical Exam GEN- NAD, alert and oriented , pleasant, vision from right eye only, overweight  Neck- no carotid bruit    CVS- RRR, no murmur   RESP-CTAB   ABD- NABS, soft, NT ND   Skin- no rash beneath breast   EXT- mild pedal edema bilat  Diabetic foot exam -normal monofilament, thickend nails, ingrown right Great toenail, mild erythema no pus, TTP  Shoulder Exam- decreased ROM right shoulder- rotator cuff weak but appears in tact, neg impingement signs     Assessment & Plan:

## 2011-04-11 NOTE — Assessment & Plan Note (Signed)
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 She does have some decreased range of motion. I showed her a couple of stretches that she is to use to help range of motion. We'll use Ultram when necessary severe pain. This does not improve in next couple weeks I will send her to Ortho for evaluation

## 2011-04-11 NOTE — Assessment & Plan Note (Signed)
Continue statin, check direct LDL

## 2011-04-11 NOTE — Assessment & Plan Note (Signed)
Blood pressure at goal no change to medication

## 2011-04-11 NOTE — Assessment & Plan Note (Signed)
Patient was active before the weather change. Continue to encourage any walking she can can do

## 2011-04-11 NOTE — Assessment & Plan Note (Signed)
Patient is now establish with new psychiatrist. Will follow for any changes in medication

## 2011-04-11 NOTE — Assessment & Plan Note (Signed)
At baseline. Patient has minimal edema at this time. Continue diuretic.

## 2011-04-11 NOTE — Assessment & Plan Note (Signed)
Patient to call podiatrist for her earlier appointment. At this time she will soak it in warm water. No antibiotics are needed.

## 2011-04-11 NOTE — Assessment & Plan Note (Signed)
Blood sugars are elevated some mornings fasting. Will obtain A1c and titrate medication

## 2011-04-11 NOTE — Patient Instructions (Signed)
Take blood pressure once a month Call podiatry about her sore toe- soak in warm water for 15 minutes twice a day until seen by foot doctor  Get the blood work done within the next 2 weeks- this is your cholesterol LDL and your A1C for diabetes  Use the ultram 50mg  twice a day as needed Call if the shoulder pain does not improve in the next 2-3 weeks I will schedule a Mammogram F/U in 3 months

## 2011-04-12 ENCOUNTER — Encounter: Payer: Self-pay | Admitting: Gastroenterology

## 2011-04-12 ENCOUNTER — Ambulatory Visit (INDEPENDENT_AMBULATORY_CARE_PROVIDER_SITE_OTHER): Payer: Medicare Other | Admitting: Gastroenterology

## 2011-04-12 VITALS — BP 112/71 | HR 74 | Temp 97.6°F | Ht 62.0 in | Wt 221.4 lb

## 2011-04-12 DIAGNOSIS — Z86010 Personal history of colonic polyps: Secondary | ICD-10-CM | POA: Diagnosis not present

## 2011-04-12 MED ORDER — PEG-KCL-NACL-NASULF-NA ASC-C 100 G PO SOLR: 1.0000 | Freq: Once | ORAL | Status: DC

## 2011-04-12 NOTE — Assessment & Plan Note (Signed)
Due for surveillance colonoscopy.  I have discussed the risks, alternatives, benefits with regards to but not limited to the risk of reaction to medication, bleeding, infection, perforation and the patient is agreeable to proceed. Written consent to be obtained.  

## 2011-04-12 NOTE — Progress Notes (Signed)
Addended by: Cherene Julian D on: 04/12/2011 12:02 PM   Modules accepted: Orders

## 2011-04-12 NOTE — Progress Notes (Signed)
Addended by: Cherene Julian D on: 04/12/2011 12:20 PM   Modules accepted: Orders

## 2011-04-12 NOTE — Patient Instructions (Signed)
We have scheduled you for a colonoscopy with Dr. Rourk. Please see separate instructions. 

## 2011-04-12 NOTE — Progress Notes (Signed)
Faxed to PCP

## 2011-04-12 NOTE — Progress Notes (Signed)
Primary Care Physician:  Milinda Antis, MD, MD  Primary Gastroenterologist:  Roetta Sessions, MD   Chief Complaint  Patient presents with  . Colonoscopy    HPI:  [name] is a 66 y.o. female here to schedule surveillance colonoscopy. H/O adenomatous colonic polyps in 2002. Last TCS 2007, pancolonic diverticulosis. Patient without complaints. Rarely has constipation. No abdominal pain, diarrhea, melena, brbpr, heartburn, dysphagia, vomiting.   Current Outpatient Prescriptions  Medication Sig Dispense Refill  . amLODipine (NORVASC) 10 MG tablet Take 10 mg by mouth daily.        Marland Kitchen aspirin 325 MG tablet Take 325 mg by mouth daily.        Marland Kitchen CALTRATE GUMMY BITES 250-400 MG-UNIT CHEW CHEW 2 GUMMY BITES BY MOUTH TWICE DAILY.  60 tablet  3  . carvedilol (COREG) 6.25 MG tablet Take 1 tablet (6.25 mg total) by mouth 2 (two) times daily with a meal.  60 tablet  6  . cloNIDine (CATAPRES) 0.1 MG tablet Take 0.1 mg by mouth 2 (two) times daily.        . clopidogrel (PLAVIX) 75 MG tablet Take 75 mg by mouth daily.        . furosemide (LASIX) 20 MG tablet Take 1 tablet (20 mg total) by mouth daily.  30 tablet  6  . glipiZIDE (GLUCOTROL) 5 MG tablet Take 2.5 mg by mouth daily.        Marland Kitchen iloperidone (FANAPT) 4 MG TABS Take by mouth. Take 1/2 tab in the am and 1 tab at bedtime       . latanoprost (XALATAN) 0.005 % ophthalmic solution Place 1 drop into the right eye at bedtime.        Marland Kitchen lisinopril (PRINIVIL,ZESTRIL) 40 MG tablet Take 40 mg by mouth daily.        Marland Kitchen perphenazine (TRILAFON) 4 MG tablet One tab in the am and 3 tabs at night       . PRAVACHOL 20 MG tablet TAKE 1 TABLET BY MOUTH EVERY EVENING.  30 each  3  . temazepam (RESTORIL) 15 MG capsule Take 15 mg by mouth at bedtime as needed.        . topiramate (TOPAMAX) 50 MG tablet Take 50 mg by mouth 3 (three) times daily.        . traMADol (ULTRAM) 50 MG tablet Take 1 tablet (50 mg total) by mouth 2 (two) times daily as needed for pain.  60  tablet  3   Current Facility-Administered Medications  Medication Dose Route Frequency Provider Last Rate Last Dose  . Influenza (>/= 3 years) inactive virus vaccine (FLVIRIN/FLUZONE) injection SUSP 0.5 mL  0.5 mL Intramuscular Once Syliva Overman, MD        Allergies as of 04/12/2011 - Review Complete 04/12/2011  Allergen Reaction Noted  . Codeine  11/10/2010    Past Medical History  Diagnosis Date  . Hypertension   . Diabetes mellitus   . Glaucoma   . Stroke     2 TIA  . OSA (obstructive sleep apnea)   . Bipolar 1 disorder   . Thyroid disease 2010  . Schizophrenia   . Adenomatous colon polyp     Past Surgical History  Procedure Date  . Abdominal hysterectomy   . 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 Multiple tooth extractions   . Colonoscopy 2007    pancolonic diverticulosis, tcs in 2012 due to h/o adenomatous polyps    Family  History  Problem Relation Age of Onset  . Other      unknown    History   Social History  . Marital Status: Divorced    Spouse Name: N/A    Number of Children: N/A  . Years of Education: N/A   Occupational History  . Not on file.   Social History Main Topics  . Smoking status: Never Smoker   . Smokeless tobacco: Not on file  . Alcohol Use: No  . Drug Use: No  . Sexually Active: Not on file   Other Topics Concern  . Not on file   Social History Narrative  . No narrative on file      ROS: Patient denies all below.  General: Negative for anorexia, weight loss, fever, chills, fatigue, weakness. Eyes: Negative for vision changes.  ENT: Negative for hoarseness, difficulty swallowing , nasal congestion. CV: Negative for chest pain, angina, palpitations, dyspnea on exertion, peripheral edema.  Respiratory: Negative for dyspnea at rest, dyspnea on exertion, cough, sputum, wheezing.  GI: See history of present illness. GU:  Negative for dysuria, hematuria, urinary incontinence, urinary frequency, nocturnal urination.  MS:  Negative for joint pain, low back pain.  Derm: Negative for rash or itching.  Neuro: Negative for weakness, abnormal sensation, seizure, frequent headaches, memory loss, confusion.  Psych: Negative for anxiety, depression, suicidal ideation, hallucinations. H/O schizophrenia. Endo: Negative for unusual weight change.  Heme: Negative for bruising or bleeding. Allergy: Negative for rash or hives.    Physical Examination:  BP 112/71  Pulse 74  Temp(Src) 97.6 F (36.4 C) (Temporal)  Ht 5\' 2"  (1.575 m)  Wt 221 lb 6.4 oz (100.426 kg)  BMI 40.49 kg/m2   General: Well-nourished, well-developed in no acute distress.  Head: Normocephalic, atraumatic.   Eyes: Conjunctiva pink, no icterus. Left eye prosthesis. Mouth: Oropharyngeal mucosa moist and pink , no lesions erythema or exudate. Neck: Supple without thyromegaly, masses, or lymphadenopathy.  Lungs: Clear to auscultation bilaterally.  Heart: Regular rate and rhythm, no murmurs rubs or gallops.  Abdomen: Bowel sounds are normal, nontender, nondistended, no hepatosplenomegaly or masses, no abdominal bruits or    hernia , no rebound or guarding.   Rectal: Not performed. Extremities: No lower extremity edema. No clubbing or deformities.  Neuro: Alert and oriented x 4 , grossly normal neurologically.  Skin: Warm and dry, no rash or jaundice.   Psych: Alert and cooperative, normal mood and affect.

## 2011-04-17 ENCOUNTER — Telehealth: Payer: Self-pay | Admitting: Family Medicine

## 2011-04-18 ENCOUNTER — Encounter (HOSPITAL_COMMUNITY): Payer: Self-pay | Admitting: Pharmacy Technician

## 2011-04-24 ENCOUNTER — Encounter: Payer: Self-pay | Admitting: Family Medicine

## 2011-04-25 DIAGNOSIS — E119 Type 2 diabetes mellitus without complications: Secondary | ICD-10-CM | POA: Diagnosis not present

## 2011-04-25 DIAGNOSIS — E785 Hyperlipidemia, unspecified: Secondary | ICD-10-CM | POA: Diagnosis not present

## 2011-04-25 LAB — BASIC METABOLIC PANEL
Calcium: 9.3 mg/dL (ref 8.4–10.5)
Creat: 1.12 mg/dL — ABNORMAL HIGH (ref 0.50–1.10)
Sodium: 143 mEq/L (ref 135–145)

## 2011-04-25 LAB — LDL CHOLESTEROL, DIRECT: Direct LDL: 61 mg/dL

## 2011-04-26 LAB — HEMOGLOBIN A1C: Hgb A1c MFr Bld: 6.5 % — ABNORMAL HIGH (ref <5.7)

## 2011-04-26 MED ORDER — SODIUM CHLORIDE 0.45 % IV SOLN: Freq: Once | INTRAVENOUS | Status: DC

## 2011-04-27 ENCOUNTER — Encounter (HOSPITAL_COMMUNITY): Admission: RE | Payer: Self-pay | Source: Ambulatory Visit

## 2011-04-27 ENCOUNTER — Ambulatory Visit (HOSPITAL_COMMUNITY): Admission: RE | Admit: 2011-04-27 | Payer: Medicare Other | Source: Ambulatory Visit | Admitting: Internal Medicine

## 2011-04-27 SURGERY — COLONOSCOPY
Anesthesia: Moderate Sedation

## 2011-05-03 ENCOUNTER — Encounter: Payer: Self-pay | Admitting: Family Medicine

## 2011-05-03 DIAGNOSIS — H539 Unspecified visual disturbance: Secondary | ICD-10-CM | POA: Insufficient documentation

## 2011-05-05 NOTE — Telephone Encounter (Signed)
Pt aware.

## 2011-05-17 ENCOUNTER — Telehealth: Payer: Self-pay | Admitting: Family Medicine

## 2011-05-17 ENCOUNTER — Other Ambulatory Visit: Payer: Self-pay

## 2011-05-17 MED ORDER — FUROSEMIDE 20 MG PO TABS: 20.0000 mg | ORAL_TABLET | Freq: Every day | ORAL | Status: DC

## 2011-05-17 MED ORDER — CARVEDILOL 6.25 MG PO TABS: 6.2500 mg | ORAL_TABLET | Freq: Two times a day (BID) | ORAL | Status: DC

## 2011-05-17 NOTE — Telephone Encounter (Signed)
Facility called and said that [name] is asking her her Tramadol daily but its a PRN med. Do you want to increase it for daily use? [facility] [phone]

## 2011-05-17 NOTE — Telephone Encounter (Signed)
Do you know what form she is referring to?

## 2011-05-17 NOTE — Telephone Encounter (Signed)
Called and left message that another form needs to be dropped off that the Dr hasn't seen one

## 2011-05-17 NOTE — Telephone Encounter (Signed)
I do not have any forms for her

## 2011-05-18 NOTE — Telephone Encounter (Signed)
She can keep it as PRN for now, it is okay if she asks for it daily, that still falls under as needed.

## 2011-05-18 NOTE — Telephone Encounter (Signed)
Facility aware.

## 2011-05-23 ENCOUNTER — Encounter: Payer: Self-pay | Admitting: Family Medicine

## 2011-05-23 ENCOUNTER — Ambulatory Visit (INDEPENDENT_AMBULATORY_CARE_PROVIDER_SITE_OTHER): Payer: Medicare Other | Admitting: Family Medicine

## 2011-05-23 VITALS — HR 70 | Resp 16 | Ht 62.0 in | Wt 213.0 lb

## 2011-05-23 DIAGNOSIS — I10 Essential (primary) hypertension: Secondary | ICD-10-CM

## 2011-05-24 NOTE — Progress Notes (Signed)
Patient ID: [name], female   DOB: 12-25-1945, 66 y.o.   MRN: [id number] Error, pt did not have appt

## 2011-05-25 DIAGNOSIS — M79609 Pain in unspecified limb: Secondary | ICD-10-CM | POA: Diagnosis not present

## 2011-05-25 DIAGNOSIS — L600 Ingrowing nail: Secondary | ICD-10-CM | POA: Diagnosis not present

## 2011-05-28 NOTE — Progress Notes (Signed)
  Subjective:    Patient ID: [name], female    DOB: 11/03/1945, 66 y.o.   MRN: [id number]  HPI Pt was evaluated in the office by her pcp Dr Jeanice Lim , so the appt with me is canceled   Review of Systems     Objective:   Physical Exam        Assessment & Plan:

## 2011-06-29 DIAGNOSIS — H4010X0 Unspecified open-angle glaucoma, stage unspecified: Secondary | ICD-10-CM | POA: Diagnosis not present

## 2011-06-29 DIAGNOSIS — H2510 Age-related nuclear cataract, unspecified eye: Secondary | ICD-10-CM | POA: Diagnosis not present

## 2011-07-10 ENCOUNTER — Encounter: Payer: Self-pay | Admitting: Family Medicine

## 2011-07-10 ENCOUNTER — Ambulatory Visit (INDEPENDENT_AMBULATORY_CARE_PROVIDER_SITE_OTHER): Payer: Medicare Other | Admitting: Family Medicine

## 2011-07-10 DIAGNOSIS — E669 Obesity, unspecified: Secondary | ICD-10-CM

## 2011-07-10 DIAGNOSIS — R609 Edema, unspecified: Secondary | ICD-10-CM

## 2011-07-10 DIAGNOSIS — R600 Localized edema: Secondary | ICD-10-CM

## 2011-07-10 DIAGNOSIS — E119 Type 2 diabetes mellitus without complications: Secondary | ICD-10-CM | POA: Diagnosis not present

## 2011-07-10 DIAGNOSIS — I10 Essential (primary) hypertension: Secondary | ICD-10-CM | POA: Diagnosis not present

## 2011-07-10 DIAGNOSIS — F209 Schizophrenia, unspecified: Secondary | ICD-10-CM

## 2011-07-10 DIAGNOSIS — Z86010 Personal history of colonic polyps: Secondary | ICD-10-CM

## 2011-07-10 DIAGNOSIS — M25519 Pain in unspecified shoulder: Secondary | ICD-10-CM

## 2011-07-10 DIAGNOSIS — E785 Hyperlipidemia, unspecified: Secondary | ICD-10-CM

## 2011-07-10 NOTE — Assessment & Plan Note (Signed)
Appears, they are to call and make sure she has her follow-up appt, As I will not be prescribing her psychiatric medications

## 2011-07-10 NOTE — Assessment & Plan Note (Signed)
Doing well, A1C at goal, no change to meds, labs prior to next visit

## 2011-07-10 NOTE — Assessment & Plan Note (Signed)
At baseline,. No change to meds

## 2011-07-10 NOTE — Progress Notes (Signed)
  Subjective:    Patient ID: [name], female    DOB: 04-01-1945, 66 y.o.   MRN: [id number]  HPI  Pt here for routine  Follow-up. No concerns, labs reviewed DM- fasting CBG taken twice a week range 104-174, no hypoglycemia Podiatry- seen by Dr. Pricilla Holm, for ingrown nail, has nail trimming next week Eye- seen by Dr. Larry Sierras, no changes GI- unable to tolerate prep, does not want colonoscopy Mammo- scheduled for May 9th   Review of Systems    GEN- denies fatigue, fever, weight loss,weakness, recent illness HEENT- denies eye drainage, change in vision, nasal discharge, CVS- denies chest pain, palpitations RESP- denies SOB, cough, wheeze ABD- denies N/V, change in stools, abd pain GU- denies dysuria, hematuria, dribbling,+ incontinence MSK- denies joint pain, muscle aches, injury Neuro- denies headache, dizziness, syncope, seizure activity    Objective:   Physical Exam GEN- NAD, alert and oriented x3 , pleasant, vision from right eye only, obese   Neck- no carotid bruit, supple    CVS- RRR, no murmur   RESP-CTAB   ABD- NABS, soft, NT ND   EXT- mild pedal edema bilat Psych- normal affect, normal speech, not depressed or anxious appearing        Assessment & Plan:

## 2011-07-10 NOTE — Patient Instructions (Signed)
F/U 4 months No change to meds See sheet

## 2011-07-10 NOTE — Assessment & Plan Note (Signed)
Pt declines repeat Colonoscopy

## 2011-07-10 NOTE — Assessment & Plan Note (Signed)
She has lost some weight,encourage any walking

## 2011-07-10 NOTE — Assessment & Plan Note (Signed)
Bp at goal

## 2011-07-10 NOTE — Assessment & Plan Note (Signed)
Improved, no further referrals at this time

## 2011-07-10 NOTE — Assessment & Plan Note (Signed)
Recheck complete Lipid panel at next visit Direct LDL was wnl

## 2011-07-17 ENCOUNTER — Ambulatory Visit (HOSPITAL_COMMUNITY): Payer: Medicare Other

## 2011-07-20 ENCOUNTER — Ambulatory Visit (HOSPITAL_COMMUNITY)
Admission: RE | Admit: 2011-07-20 | Discharge: 2011-07-20 | Disposition: A | Discharge status: Home / Self Care | Payer: Medicare Other | Source: Ambulatory Visit | Attending: Family Medicine | Admitting: Family Medicine

## 2011-07-20 DIAGNOSIS — Z1231 Encounter for screening mammogram for malignant neoplasm of breast: Secondary | ICD-10-CM | POA: Diagnosis not present

## 2011-07-20 DIAGNOSIS — E119 Type 2 diabetes mellitus without complications: Secondary | ICD-10-CM | POA: Diagnosis not present

## 2011-07-20 DIAGNOSIS — Z1239 Encounter for other screening for malignant neoplasm of breast: Secondary | ICD-10-CM

## 2011-07-20 DIAGNOSIS — E1149 Type 2 diabetes mellitus with other diabetic neurological complication: Secondary | ICD-10-CM | POA: Diagnosis not present

## 2011-07-25 ENCOUNTER — Other Ambulatory Visit: Payer: Self-pay | Admitting: Family Medicine

## 2011-07-25 DIAGNOSIS — R928 Other abnormal and inconclusive findings on diagnostic imaging of breast: Secondary | ICD-10-CM

## 2011-08-02 ENCOUNTER — Telehealth: Payer: Self-pay | Admitting: Family Medicine

## 2011-08-02 NOTE — Telephone Encounter (Signed)
Noted  

## 2011-08-02 NOTE — Telephone Encounter (Signed)
I left a message with Ms.Argentina Ponder [phone] I need to know if pt has been contacted regarding the abnormal Mammogram- possible right breast mass

## 2011-08-03 DIAGNOSIS — F205 Residual schizophrenia: Secondary | ICD-10-CM | POA: Diagnosis not present

## 2011-08-08 ENCOUNTER — Telehealth: Payer: Self-pay | Admitting: Family Medicine

## 2011-08-08 NOTE — Telephone Encounter (Signed)
I spoke with Ms. 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 She is aware of abnormal mammogram and was contacted by radiology. They're waiting for her daughter to, from [place] and she wants to be there with her mother for the procedure. She's supposed to call back this week to tell us when she needs to have this scheduled I discussed the importance of this, and she is aware, will f/u next week to make sure this is done

## 2011-08-16 ENCOUNTER — Ambulatory Visit (HOSPITAL_COMMUNITY)
Admission: RE | Admit: 2011-08-16 | Discharge: 2011-08-16 | Disposition: A | Discharge status: Home / Self Care | Payer: Medicare Other | Source: Ambulatory Visit | Attending: Family Medicine | Admitting: Family Medicine

## 2011-08-16 DIAGNOSIS — R928 Other abnormal and inconclusive findings on diagnostic imaging of breast: Secondary | ICD-10-CM | POA: Diagnosis not present

## 2011-09-11 ENCOUNTER — Other Ambulatory Visit: Payer: Self-pay | Admitting: Family Medicine

## 2011-10-03 DIAGNOSIS — E1149 Type 2 diabetes mellitus with other diabetic neurological complication: Secondary | ICD-10-CM | POA: Diagnosis not present

## 2011-10-03 DIAGNOSIS — E119 Type 2 diabetes mellitus without complications: Secondary | ICD-10-CM | POA: Diagnosis not present

## 2011-10-04 DIAGNOSIS — E785 Hyperlipidemia, unspecified: Secondary | ICD-10-CM | POA: Diagnosis not present

## 2011-10-04 DIAGNOSIS — E119 Type 2 diabetes mellitus without complications: Secondary | ICD-10-CM | POA: Diagnosis not present

## 2011-10-04 LAB — CBC
HCT: 40.5 % (ref 36.0–46.0)
MCHC: 32.6 g/dL (ref 30.0–36.0)
MCV: 79.6 fL (ref 78.0–100.0)
RDW: 15 % (ref 11.5–15.5)

## 2011-10-04 LAB — COMPREHENSIVE METABOLIC PANEL
ALT: 11 U/L (ref 0–35)
AST: 12 U/L (ref 0–37)
Alkaline Phosphatase: 87 U/L (ref 39–117)
Creat: 1.05 mg/dL (ref 0.50–1.10)
Total Bilirubin: 0.3 mg/dL (ref 0.3–1.2)

## 2011-10-04 LAB — LIPID PANEL
HDL: 54 mg/dL (ref >39)
LDL Cholesterol: 69 mg/dL (ref 0–99)

## 2011-10-04 LAB — HEMOGLOBIN A1C
Hgb A1c MFr Bld: 6.4 % — ABNORMAL HIGH (ref <5.7)
Mean Plasma Glucose: 137 mg/dL — ABNORMAL HIGH (ref <117)

## 2011-10-05 DIAGNOSIS — F205 Residual schizophrenia: Secondary | ICD-10-CM | POA: Diagnosis not present

## 2011-10-10 ENCOUNTER — Encounter: Payer: Self-pay | Admitting: Family Medicine

## 2011-10-10 ENCOUNTER — Ambulatory Visit (INDEPENDENT_AMBULATORY_CARE_PROVIDER_SITE_OTHER): Payer: Medicare Other | Admitting: Family Medicine

## 2011-10-10 ENCOUNTER — Ambulatory Visit: Payer: Medicare Other | Admitting: Family Medicine

## 2011-10-10 VITALS — BP 128/82 | HR 78 | Resp 18 | Ht 62.0 in | Wt 219.1 lb

## 2011-10-10 DIAGNOSIS — E785 Hyperlipidemia, unspecified: Secondary | ICD-10-CM | POA: Diagnosis not present

## 2011-10-10 DIAGNOSIS — E119 Type 2 diabetes mellitus without complications: Secondary | ICD-10-CM | POA: Diagnosis not present

## 2011-10-10 DIAGNOSIS — I10 Essential (primary) hypertension: Secondary | ICD-10-CM

## 2011-10-10 DIAGNOSIS — E039 Hypothyroidism, unspecified: Secondary | ICD-10-CM

## 2011-10-10 DIAGNOSIS — Z86010 Personal history of colonic polyps: Secondary | ICD-10-CM

## 2011-10-10 DIAGNOSIS — R600 Localized edema: Secondary | ICD-10-CM

## 2011-10-10 DIAGNOSIS — R609 Edema, unspecified: Secondary | ICD-10-CM

## 2011-10-10 NOTE — Patient Instructions (Signed)
Continue current medications F/U 3 months  

## 2011-10-11 ENCOUNTER — Encounter: Payer: Self-pay | Admitting: Family Medicine

## 2011-10-11 NOTE — Assessment & Plan Note (Signed)
Pt facility to reschedule colonoscopy

## 2011-10-11 NOTE — Progress Notes (Signed)
  Subjective:    Patient ID: [name], female    DOB: [birth year], 66 y.o.   MRN: [id number]  HPI Pt here to f/u Chronic medical problems, no concerns, no difficulties with medications Immunizations and preventative care UTD DM- no hypoglycemia, CBG < 200 fasting Would like to proceed with colonoscopy Seen by Psych no changes to medications Labs reviewed  Seen by podiatry  Review of Systems   GEN- denies fatigue, fever, weight loss,weakness, recent illness HEENT- denies eye drainage, change in vision, nasal discharge, CVS- denies chest pain, palpitations RESP- denies SOB, cough, wheeze ABD- denies N/V, change in stools, abd pain GU- denies dysuria, hematuria, dribbling, +incontinence MSK- denies joint pain, muscle aches, injury Neuro- denies headache, dizziness, syncope, seizure activity      Objective:   Physical Exam GEN- NAD, alert and oriented x3 , pleasant, vision from right eye only, obese  HEENT- Right pupil reactive, MMM, EOMI, oropharynx clear   Neck- supple    CVS- RRR, no murmur   RESP-CTAB   ABD- NABS, soft, NT, ND   EXT- + pedal edema bilat Psych- normal affect and mood         Assessment & Plan:

## 2011-10-11 NOTE — Assessment & Plan Note (Signed)
LDL at goal, continue statin therapy  ?

## 2011-10-11 NOTE — Assessment & Plan Note (Signed)
Well controlled, no change to meds 

## 2011-10-11 NOTE — Assessment & Plan Note (Signed)
Thyroid studies normal

## 2011-10-11 NOTE — Assessment & Plan Note (Signed)
Well controlled no change to meds, on ACEI

## 2011-10-11 NOTE — Assessment & Plan Note (Signed)
Stable with lasix

## 2011-10-13 ENCOUNTER — Telehealth: Payer: Self-pay | Admitting: *Deleted

## 2011-10-13 NOTE — Telephone Encounter (Signed)
Diane called today in regards to Ms Simmerman. She would like to get Ms Kronberg set up for a colonoscopy and would like to know what they need to do next. Please call her back. Thanks.

## 2011-10-13 NOTE — Telephone Encounter (Signed)
Pt has an appointment on Tuesday at 9:30 with [facility]

## 2011-10-17 ENCOUNTER — Ambulatory Visit (INDEPENDENT_AMBULATORY_CARE_PROVIDER_SITE_OTHER): Payer: Medicare Other | Admitting: Gastroenterology

## 2011-10-17 ENCOUNTER — Encounter: Payer: Self-pay | Admitting: Gastroenterology

## 2011-10-17 ENCOUNTER — Other Ambulatory Visit: Payer: Self-pay | Admitting: Internal Medicine

## 2011-10-17 VITALS — BP 120/73 | HR 71 | Temp 98.0°F | Ht 62.0 in | Wt 220.6 lb

## 2011-10-17 DIAGNOSIS — Z86010 Personal history of colonic polyps: Secondary | ICD-10-CM

## 2011-10-17 DIAGNOSIS — Z1211 Encounter for screening for malignant neoplasm of colon: Secondary | ICD-10-CM

## 2011-10-17 MED ORDER — SOD PICOSULFATE-MAG OX-CIT ACD 10-3.5-12 MG-GM-GM PO PACK: 1.0000 | PACK | Freq: Once | ORAL | Status: DC

## 2011-10-17 NOTE — Progress Notes (Signed)
Referring Provider: Garrett, Kawanta F, MD Primary Care Physician:  [place], KAWANTA, MD Primary Gastroenterologist: Dr. Rourk   Chief Complaint  Patient presents with  . Colonoscopy    HPI:   [name] presents today for visit prior to surveillance colonoscopy. H/O adenomatous colonic polyps in 2002. Last TCS 2007, pancolonic diverticulosis. She was actually scheduled for a procedure in February, but this was cancelled. States she was taking the prep and "not able to keep up with it" because of having to go to bathroom so much. She lives in a group home, and the bathroom is not in her room. She denies any nausea, vomiting with prep. However, I called and spoke with Daphne Williamson, a worker at the group home who is familiar with Ms. Deas. ([phone]). She states it was not the prep that was the issue; she stated the patient refused to take it. However, pt has agreed to do the colonoscopy and try Prepopik.   It appears she needs someone with her during the prep to encourage her to continue drinking. It would also be beneficial to have a bedside commode for the prep.   She has no complaints. Denies rectal bleeding. No change in bowel habits. No GERD, dysphagia, N/V.    Past Medical History  Diagnosis Date  . Hypertension   . Diabetes mellitus   . Glaucoma   . Stroke     2 TIA  . OSA (obstructive sleep apnea)   . Bipolar 1 disorder   . Thyroid disease 2010  . Schizophrenia   . Adenomatous colon polyp   . Urinary incontinence, mixed     Past Surgical History  Procedure Date  . Abdominal hysterectomy   . 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 Multiple tooth extractions   . Colonoscopy 2007    pancolonic diverticulosis, tcs in 2012 due to h/o adenomatous polyps    Current Outpatient Prescriptions  Medication Sig Dispense Refill  . amLODipine (NORVASC) 10 MG tablet Take 10 mg by mouth daily.        . aspirin 325 MG tablet Take 325 mg by mouth daily.        . Calcium  Carbonate-Vitamin D (CALTRATE COLON HEALTH) 600-200 MG-UNIT TABS Take 1 tablet by mouth 2 (two) times daily.      . CALTRATE GUMMY BITES 250-400 MG-UNIT CHEW CHEW 2 GUMMY BITES BY MOUTH TWICE DAILY.  60 tablet  3  . carvedilol (COREG) 6.25 MG tablet Take 1 tablet (6.25 mg total) by mouth 2 (two) times daily with a meal.  60 tablet  6  . cloNIDine (CATAPRES) 0.1 MG tablet Take 0.1 mg by mouth 2 (two) times daily.        . clopidogrel (PLAVIX) 75 MG tablet Take 75 mg by mouth daily.        . furosemide (LASIX) 20 MG tablet Take 1 tablet (20 mg total) by mouth daily.  30 tablet  6  . glipiZIDE (GLUCOTROL) 5 MG tablet Take 5 mg by mouth daily.       . iloperidone (FANAPT) 4 MG TABS Take 4 mg by mouth daily.       . latanoprost (XALATAN) 0.005 % ophthalmic solution Place 1 drop into the right eye at bedtime.        . lisinopril (PRINIVIL,ZESTRIL) 40 MG tablet Take 40 mg by mouth daily.        . perphenazine (TRILAFON) 2 MG tablet Take 2 mg by mouth 2 (  two) times daily.      . PRAVACHOL 20 MG tablet TAKE 1 TABLET BY MOUTH EVERY EVENING.  30 each  3  . temazepam (RESTORIL) 15 MG capsule Take 15 mg by mouth at bedtime as needed. For sleep      . topiramate (TOPAMAX) 50 MG tablet Take 50 mg by mouth 3 (three) times daily.        . traMADol (ULTRAM) 50 MG tablet Take 1 tablet (50 mg total) by mouth 2 (two) times daily as needed for pain.  60 tablet  3   Current Facility-Administered Medications  Medication Dose Route Frequency Provider Last Rate Last Dose  . Influenza (>/= 3 years) inactive virus vaccine (FLVIRIN/FLUZONE) injection SUSP 0.5 mL  0.5 mL Intramuscular Once Margaret E Simpson, MD        Allergies as of 10/17/2011 - Review Complete 10/17/2011  Allergen Reaction Noted  . Codeine  11/10/2010    Family History  Problem Relation Age of Onset  . Other      unknown    History   Social History  . Marital Status: Divorced    Spouse Name: N/A    Number of Children: N/A  . Years of  Education: N/A   Social History Main Topics  . Smoking status: Never Smoker   . Smokeless tobacco: None  . Alcohol Use: No  . Drug Use: No  . Sexually Active: None   Other Topics Concern  . None   Social History Narrative  . None    Review of Systems: Gen: Denies fever, chills, anorexia. Denies fatigue, weakness, weight loss.  CV: Denies chest pain, palpitations, syncope, peripheral edema, and claudication. Resp: Denies dyspnea at rest, cough, wheezing, coughing up blood, and pleurisy. GI: Denies vomiting blood, jaundice, and fecal incontinence.   Denies dysphagia or odynophagia. Derm: Denies rash, itching, dry skin Psych: Denies depression, anxiety, memory loss, confusion. No homicidal or suicidal ideation.  Heme: Denies bruising, bleeding, and enlarged lymph nodes.  Physical Exam: BP 120/73  Pulse 71  Temp 98 F (36.7 C) (Temporal)  Ht 5' 2" (1.575 m)  Wt 220 lb 9.6 oz (100.064 kg)  BMI 40.35 kg/m2 General:   Alert and oriented. No distress noted. Pleasant and cooperative.  Head:  Normocephalic and atraumatic. Eyes:  Conjuctiva clear without scleral icterus. Mouth:  Oral mucosa pink and moist.  Neck:  Supple, without mass or thyromegaly. Heart:  S1, S2 present without murmurs, rubs, or gallops. Regular rate and rhythm. Abdomen:  +BS, soft, obese, non-tender and non-distended. No rebound or guarding. No HSM or masses noted. Msk:  Symmetrical without gross deformities. Normal posture. Extremities:  1-2+ lower extremity edema bilaterally, chronic Neurologic:  Alert and  oriented x4;  grossly normal neurologically. Skin:  Intact without significant lesions or rashes. Cervical Nodes:  No significant cervical adenopathy. Psych:  Alert and cooperative. Flat affect.   

## 2011-10-17 NOTE — Progress Notes (Signed)
Faxed to PCP

## 2011-10-17 NOTE — Assessment & Plan Note (Addendum)
66 year old female due for surveillance colonoscopy. No lower or upper GI symptoms currently. Was scheduled in Feb 2013 but cancelled due to refusal of taking prep. Concern for compliance as noted in HPI. Will try Prepopik and request bedside commode in her room at the group home to facilitate easier prep completion. Pt is agreeable to this. I have spoken with Elly Modena about the bedside commode.   Proceed with TCS with Dr. Jena Gauss in near future: the risks, benefits, and alternatives have been discussed with the patient in detail. The patient states understanding and desires to proceed.

## 2011-10-17 NOTE — Patient Instructions (Addendum)
We have set you up for a colonoscopy in the near future with Dr. Jena Gauss   Medication Instructions: Do not take diabetes medication the day of the procedure The day of the procedure, follow the instructions written down regarding blood pressure medication. You will take them with small sips of water.

## 2011-11-08 ENCOUNTER — Encounter (HOSPITAL_COMMUNITY): Payer: Self-pay | Admitting: Pharmacy Technician

## 2011-11-15 ENCOUNTER — Encounter (HOSPITAL_COMMUNITY)
Admission: RE | Disposition: A | Discharge status: Home / Self Care | Payer: Self-pay | Source: Ambulatory Visit | Attending: Internal Medicine

## 2011-11-15 ENCOUNTER — Encounter (HOSPITAL_COMMUNITY): Payer: Self-pay | Admitting: *Deleted

## 2011-11-15 ENCOUNTER — Ambulatory Visit (HOSPITAL_COMMUNITY)
Admission: RE | Admit: 2011-11-15 | Discharge: 2011-11-15 | Disposition: A | Discharge status: Home / Self Care | Payer: Medicare Other | Source: Ambulatory Visit | Attending: Internal Medicine | Admitting: Internal Medicine

## 2011-11-15 DIAGNOSIS — I10 Essential (primary) hypertension: Secondary | ICD-10-CM | POA: Insufficient documentation

## 2011-11-15 DIAGNOSIS — E119 Type 2 diabetes mellitus without complications: Secondary | ICD-10-CM | POA: Diagnosis not present

## 2011-11-15 DIAGNOSIS — Z860100 Personal history of colon polyps, unspecified: Secondary | ICD-10-CM | POA: Insufficient documentation

## 2011-11-15 DIAGNOSIS — Z01812 Encounter for preprocedural laboratory examination: Secondary | ICD-10-CM | POA: Diagnosis not present

## 2011-11-15 DIAGNOSIS — K5730 Diverticulosis of large intestine without perforation or abscess without bleeding: Secondary | ICD-10-CM | POA: Insufficient documentation

## 2011-11-15 DIAGNOSIS — Z79899 Other long term (current) drug therapy: Secondary | ICD-10-CM | POA: Diagnosis not present

## 2011-11-15 DIAGNOSIS — Z1211 Encounter for screening for malignant neoplasm of colon: Secondary | ICD-10-CM

## 2011-11-15 HISTORY — PX: COLONOSCOPY: SHX5424

## 2011-11-15 SURGERY — COLONOSCOPY
Anesthesia: Moderate Sedation

## 2011-11-15 MED ORDER — SODIUM CHLORIDE 0.45 % IV SOLN
Freq: Once | INTRAVENOUS | Status: AC
  Administered 2011-11-15: 08:00:00 via INTRAVENOUS

## 2011-11-15 MED ORDER — MEPERIDINE HCL 100 MG/ML IJ SOLN
INTRAMUSCULAR | Status: AC
  Filled 2011-11-15: qty 2

## 2011-11-15 MED ORDER — MIDAZOLAM HCL 5 MG/5ML IJ SOLN
INTRAMUSCULAR | Status: AC
  Filled 2011-11-15: qty 10

## 2011-11-15 MED ORDER — MEPERIDINE HCL 100 MG/ML IJ SOLN
INTRAMUSCULAR | Status: DC | PRN
  Administered 2011-11-15: 50 mg via INTRAVENOUS

## 2011-11-15 MED ORDER — MIDAZOLAM HCL 5 MG/5ML IJ SOLN
INTRAMUSCULAR | Status: DC | PRN
  Administered 2011-11-15: 2 mg via INTRAVENOUS

## 2011-11-15 MED ORDER — STERILE WATER FOR IRRIGATION IR SOLN
Status: DC | PRN
  Administered 2011-11-15: 08:00:00

## 2011-11-15 NOTE — H&P (View-Only) (Signed)
Referring Provider: Salley Scarlet, MD Primary Care Physician:  Milinda Antis, MD Primary Gastroenterologist: Dr. Jena Gauss   Chief Complaint  Patient presents with  . Colonoscopy    HPI:   [name] presents today for visit prior to surveillance colonoscopy. H/O adenomatous colonic polyps in 2002. Last TCS 2007, pancolonic diverticulosis. She was actually scheduled for a procedure in February, but this was cancelled. States she was taking the prep and "not able to keep up with it" because of having to go to bathroom so much. She lives in a group home, and the bathroom is not in her room. She denies any nausea, vomiting with prep. However, I called and spoke with Elly Modena, a worker at the group home who is familiar with [name]. [phone]). She states it was not the prep that was the issue; she stated the patient refused to take it. However, pt has agreed to do the colonoscopy and try Prepopik.   It appears she needs someone with her during the prep to encourage her to continue drinking. It would also be beneficial to have a bedside commode for the prep.   She has no complaints. Denies rectal bleeding. No change in bowel habits. No GERD, dysphagia, N/V.    Past Medical History  Diagnosis Date  . Hypertension   . Diabetes mellitus   . Glaucoma   . Stroke     2 TIA  . OSA (obstructive sleep apnea)   . Bipolar 1 disorder   . Thyroid disease 2010  . Schizophrenia   . Adenomatous colon polyp   . Urinary incontinence, mixed     Past Surgical History  Procedure Date  . Abdominal hysterectomy   . 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 Multiple tooth extractions   . Colonoscopy 2007    pancolonic diverticulosis, tcs in 2012 due to h/o adenomatous polyps    Current Outpatient Prescriptions  Medication Sig Dispense Refill  . amLODipine (NORVASC) 10 MG tablet Take 10 mg by mouth daily.        Marland Kitchen aspirin 325 MG tablet Take 325 mg by mouth daily.        . Calcium  Carbonate-Vitamin D (CALTRATE COLON HEALTH) 600-200 MG-UNIT TABS Take 1 tablet by mouth 2 (two) times daily.      Marland Kitchen CALTRATE GUMMY BITES 250-400 MG-UNIT CHEW CHEW 2 GUMMY BITES BY MOUTH TWICE DAILY.  60 tablet  3  . carvedilol (COREG) 6.25 MG tablet Take 1 tablet (6.25 mg total) by mouth 2 (two) times daily with a meal.  60 tablet  6  . cloNIDine (CATAPRES) 0.1 MG tablet Take 0.1 mg by mouth 2 (two) times daily.        . clopidogrel (PLAVIX) 75 MG tablet Take 75 mg by mouth daily.        . furosemide (LASIX) 20 MG tablet Take 1 tablet (20 mg total) by mouth daily.  30 tablet  6  . glipiZIDE (GLUCOTROL) 5 MG tablet Take 5 mg by mouth daily.       Marland Kitchen iloperidone (FANAPT) 4 MG TABS Take 4 mg by mouth daily.       Marland Kitchen latanoprost (XALATAN) 0.005 % ophthalmic solution Place 1 drop into the right eye at bedtime.        Marland Kitchen lisinopril (PRINIVIL,ZESTRIL) 40 MG tablet Take 40 mg by mouth daily.        Marland Kitchen perphenazine (TRILAFON) 2 MG tablet Take 2 mg by mouth 2 (  two) times daily.      Marland Kitchen PRAVACHOL 20 MG tablet TAKE 1 TABLET BY MOUTH EVERY EVENING.  30 each  3  . temazepam (RESTORIL) 15 MG capsule Take 15 mg by mouth at bedtime as needed. For sleep      . topiramate (TOPAMAX) 50 MG tablet Take 50 mg by mouth 3 (three) times daily.        . traMADol (ULTRAM) 50 MG tablet Take 1 tablet (50 mg total) by mouth 2 (two) times daily as needed for pain.  60 tablet  3   Current Facility-Administered Medications  Medication Dose Route Frequency Provider Last Rate Last Dose  . Influenza (>/= 3 years) inactive virus vaccine (FLVIRIN/FLUZONE) injection SUSP 0.5 mL  0.5 mL Intramuscular Once Kerri Perches, MD        Allergies as of 10/17/2011 - Review Complete 10/17/2011  Allergen Reaction Noted  . Codeine  11/10/2010    Family History  Problem Relation Age of Onset  . Other      unknown    History   Social History  . Marital Status: Divorced    Spouse Name: N/A    Number of Children: N/A  . Years of  Education: N/A   Social History Main Topics  . Smoking status: Never Smoker   . Smokeless tobacco: None  . Alcohol Use: No  . Drug Use: No  . Sexually Active: None   Other Topics Concern  . None   Social History Narrative  . None    Review of Systems: Gen: Denies fever, chills, anorexia. Denies fatigue, weakness, weight loss.  CV: Denies chest pain, palpitations, syncope, peripheral edema, and claudication. Resp: Denies dyspnea at rest, cough, wheezing, coughing up blood, and pleurisy. GI: Denies vomiting blood, jaundice, and fecal incontinence.   Denies dysphagia or odynophagia. Derm: Denies rash, itching, dry skin Psych: Denies depression, anxiety, memory loss, confusion. No homicidal or suicidal ideation.  Heme: Denies bruising, bleeding, and enlarged lymph nodes.  Physical Exam: BP 120/73  Pulse 71  Temp 98 F (36.7 C) (Temporal)  Ht 5\' 2"  (1.575 m)  Wt 220 lb 9.6 oz (100.064 kg)  BMI 40.35 kg/m2 General:   Alert and oriented. No distress noted. Pleasant and cooperative.  Head:  Normocephalic and atraumatic. Eyes:  Conjuctiva clear without scleral icterus. Mouth:  Oral mucosa pink and moist.  Neck:  Supple, without mass or thyromegaly. Heart:  S1, S2 present without murmurs, rubs, or gallops. Regular rate and rhythm. Abdomen:  +BS, soft, obese, non-tender and non-distended. No rebound or guarding. No HSM or masses noted. Msk:  Symmetrical without gross deformities. Normal posture. Extremities:  1-2+ lower extremity edema bilaterally, chronic Neurologic:  Alert and  oriented x4;  grossly normal neurologically. Skin:  Intact without significant lesions or rashes. Cervical Nodes:  No significant cervical adenopathy. Psych:  Alert and cooperative. Flat affect.

## 2011-11-15 NOTE — Op Note (Signed)
[facility] [address] [place], [address]   COLONOSCOPY PROCEDURE REPORT  PATIENT: [name], [name]  MR#:         [id number] BIRTHDATE: 1946-02-02 , 66  yrs. old GENDER: Female ENDOSCOPIST: R.  Roetta Sessions, MD FACP FACG REFERRED BY:  Milinda Antis, M.D. PROCEDURE DATE:  11/15/2011 PROCEDURE:    surveillance colonoscopy  INDICATIONS: history of colonic adenoma  INFORMED CONSENT:  The risks, benefits, alternatives and imponderables including but not limited to bleeding, perforation as well as the possibility of a missed lesion have been reviewed.  The potential for biopsy, lesion removal, etc. have also been discussed.  Questions have been answered.  All parties agreeable. Please see the history and physical in the medical record for more information.  MEDICATIONS: Versed 2 mg IV and Demerol 50 mg IV  DESCRIPTION OF PROCEDURE:  After a digital rectal exam was performed, the Pentax Colonoscope [id number]  colonoscope was advanced from the anus through the rectum and colon to the area of the cecum, ileocecal valve and appendiceal orifice.  The cecum was deeply intubated.  These structures were well-seen and photographed for the record.  From the level of the cecum and ileocecal valve, the scope was slowly and cautiously withdrawn.  The mucosal surfaces were carefully surveyed utilizing scope tip deflection to facilitate fold flattening as needed.  The scope was pulled down into the rectum where a thorough examination including retroflexion was performed.    FINDINGS:  marginal prep compromised examination. Anal papilla; otherwise, normal-appearing rectal mucosa. Left-sided and transverse diverticula; the remainder of colonic mucosa appeared grossly normal.  THERAPEUTIC / DIAGNOSTIC MANEUVERS PERFORMED:  none  COMPLICATIONS: none  CECAL WITHDRAWAL TIME:  13 minutes  IMPRESSION:  colonic diverticulosis  RECOMMENDATIONS: repeat surveillance  colonoscopy in 5 years   _______________________________ eSigned:  R. Roetta Sessions, MD FACP [facility] 11/15/2011 8:18 AM   CC:    PATIENT NAME:  [name], [name] MR#: [id number]

## 2011-11-15 NOTE — Interval H&P Note (Signed)
History and Physical Interval Note:  11/15/2011 7:39 AM  [name]  has presented today for surgery, with the diagnosis of HISTORY OF ADENOMATOUS POLYPS  The various methods of treatment have been discussed with the patient and family. After consideration of risks, benefits and other options for treatment, the patient has consented to  Procedure(s) (LRB) with comments: COLONOSCOPY (N/A) - 7:30 as a surgical intervention .  The patient's history has been reviewed, patient examined, no change in status, stable for surgery.  I have reviewed the patient's chart and labs.  Questions were answered to the patient's satisfaction.     [name]

## 2011-11-20 ENCOUNTER — Encounter (HOSPITAL_COMMUNITY): Payer: Self-pay | Admitting: Internal Medicine

## 2011-12-12 ENCOUNTER — Telehealth: Payer: Self-pay | Admitting: Family Medicine

## 2011-12-13 ENCOUNTER — Other Ambulatory Visit: Payer: Self-pay | Admitting: Family Medicine

## 2011-12-13 ENCOUNTER — Telehealth: Payer: Self-pay | Admitting: Family Medicine

## 2011-12-13 MED ORDER — GLUCOSE BLOOD VI STRP: ORAL_STRIP | Status: DC

## 2011-12-13 NOTE — Telephone Encounter (Signed)
rx sent in for strips to [facility] as requested

## 2011-12-13 NOTE — Telephone Encounter (Signed)
It was for glucose strips and I called the facilty and told them that the Dr had to sign and they would be sent to [facility] tomorrow

## 2011-12-14 DIAGNOSIS — F205 Residual schizophrenia: Secondary | ICD-10-CM | POA: Diagnosis not present

## 2011-12-19 DIAGNOSIS — Z23 Encounter for immunization: Secondary | ICD-10-CM | POA: Diagnosis not present

## 2011-12-27 DIAGNOSIS — E1149 Type 2 diabetes mellitus with other diabetic neurological complication: Secondary | ICD-10-CM | POA: Diagnosis not present

## 2011-12-27 DIAGNOSIS — E119 Type 2 diabetes mellitus without complications: Secondary | ICD-10-CM | POA: Diagnosis not present

## 2011-12-28 DIAGNOSIS — E119 Type 2 diabetes mellitus without complications: Secondary | ICD-10-CM | POA: Diagnosis not present

## 2011-12-28 DIAGNOSIS — H2510 Age-related nuclear cataract, unspecified eye: Secondary | ICD-10-CM | POA: Diagnosis not present

## 2011-12-28 DIAGNOSIS — H4010X0 Unspecified open-angle glaucoma, stage unspecified: Secondary | ICD-10-CM | POA: Diagnosis not present

## 2012-01-11 ENCOUNTER — Ambulatory Visit: Payer: Medicare Other | Admitting: Family Medicine

## 2012-01-12 ENCOUNTER — Ambulatory Visit: Payer: Medicare Other | Admitting: Family Medicine

## 2012-01-23 ENCOUNTER — Encounter: Payer: Self-pay | Admitting: Family Medicine

## 2012-01-23 ENCOUNTER — Ambulatory Visit (INDEPENDENT_AMBULATORY_CARE_PROVIDER_SITE_OTHER): Payer: Medicare Other | Admitting: Family Medicine

## 2012-01-23 ENCOUNTER — Other Ambulatory Visit: Payer: Self-pay | Admitting: Family Medicine

## 2012-01-23 VITALS — BP 120/80 | HR 72 | Resp 15 | Ht 62.0 in | Wt 218.8 lb

## 2012-01-23 DIAGNOSIS — E119 Type 2 diabetes mellitus without complications: Secondary | ICD-10-CM | POA: Diagnosis not present

## 2012-01-23 DIAGNOSIS — I10 Essential (primary) hypertension: Secondary | ICD-10-CM

## 2012-01-23 DIAGNOSIS — E785 Hyperlipidemia, unspecified: Secondary | ICD-10-CM

## 2012-01-23 DIAGNOSIS — E669 Obesity, unspecified: Secondary | ICD-10-CM

## 2012-01-23 DIAGNOSIS — J209 Acute bronchitis, unspecified: Secondary | ICD-10-CM

## 2012-01-23 DIAGNOSIS — E870 Hyperosmolality and hypernatremia: Secondary | ICD-10-CM

## 2012-01-23 MED ORDER — GUAIFENESIN 100 MG/5ML PO LIQD: 200.0000 mg | Freq: Three times a day (TID) | ORAL | Status: DC | PRN

## 2012-01-23 MED ORDER — AZITHROMYCIN 250 MG PO TABS
ORAL_TABLET | ORAL | Status: AC
Start: 2012-01-23 — End: 2012-01-28

## 2012-01-23 NOTE — Assessment & Plan Note (Signed)
Well controlled 

## 2012-01-23 NOTE — Patient Instructions (Addendum)
Cough medicine and antibiotics for bronchitis Continue all other medications Labs before next visit - 1 week before F/U 3 months

## 2012-01-23 NOTE — Progress Notes (Signed)
  Subjective:    Patient ID: [name], female    DOB: 07-13-1945, 66 y.o.   MRN: [id number]  HPI Patient here to follow chronic medical problems. She's had a cough which is a dry hacking however she feels congested for the past 3 weeks. Positive sick contacts with some of the other residents at her home. No fever no shortness of breath no wheezing. Diabetes mellitus her blood sugars have been around 150 fasting. Her highest is 200 however this was taken after she had eaten breakfast. No recent medication changes with her psychiatrist. +Podiatry +flu shot  Review of Systems  GEN- denies fatigue, fever, weight loss,weakness, recent illness HEENT- denies eye drainage, change in vision, nasal discharge, CVS- denies chest pain, palpitations RESP- denies SOB,+ cough, wheeze ABD- denies N/V, change in stools, abd pain GU- denies dysuria, hematuria, dribbling, incontinence MSK- denies joint pain, muscle aches, injury Neuro- denies headache, dizziness, syncope, seizure activity      Objective:   Physical Exam GEN- NAD, alert and oriented x3 HEENT- Right eye reactive, EOMI, non injected sclera, pink conjunctiva, MMM, oropharynx clear, TM clear bilat no effusion Neck- Supple, no LAD CVS- RRR, no murmur RESP-upper airway congestion, no wheezing, no rhonchi EXT- No edema Pulses- Radial 2+ Foot- No open lesions, normal sensation, mild callus         Assessment & Plan:

## 2012-01-23 NOTE — Assessment & Plan Note (Signed)
Well controlled, labs with next visit

## 2012-01-23 NOTE — Assessment & Plan Note (Signed)
Treat for acute bronchitis based on history. Zpak, cough medication sent

## 2012-01-23 NOTE — Assessment & Plan Note (Signed)
Previously well controlled, labs before next visit

## 2012-01-23 NOTE — Assessment & Plan Note (Signed)
Continues to lose weight

## 2012-02-15 ENCOUNTER — Other Ambulatory Visit: Payer: Self-pay | Admitting: Family Medicine

## 2012-02-22 DIAGNOSIS — F205 Residual schizophrenia: Secondary | ICD-10-CM | POA: Diagnosis not present

## 2012-04-18 ENCOUNTER — Other Ambulatory Visit: Payer: Self-pay | Admitting: Family Medicine

## 2012-04-25 ENCOUNTER — Ambulatory Visit: Payer: Medicare Other | Admitting: Family Medicine

## 2012-04-29 ENCOUNTER — Encounter: Payer: Self-pay | Admitting: Family Medicine

## 2012-04-29 DIAGNOSIS — R32 Unspecified urinary incontinence: Secondary | ICD-10-CM | POA: Insufficient documentation

## 2012-05-06 LAB — BASIC METABOLIC PANEL
BUN: 15 mg/dL (ref 6–23)
CO2: 28 mEq/L (ref 19–32)
Chloride: 108 mEq/L (ref 96–112)
Creat: 1.15 mg/dL — ABNORMAL HIGH (ref 0.50–1.10)

## 2012-05-06 LAB — CBC
Hemoglobin: 13.7 g/dL (ref 12.0–15.0)
MCH: 26.1 pg (ref 26.0–34.0)
MCHC: 32.8 g/dL (ref 30.0–36.0)
Platelets: 244 10*3/uL (ref 150–400)

## 2012-05-10 DIAGNOSIS — E870 Hyperosmolality and hypernatremia: Secondary | ICD-10-CM | POA: Diagnosis not present

## 2012-05-10 NOTE — Addendum Note (Signed)
Addended by: Kandis Fantasia B on: 05/10/2012 04:47 PM   Modules accepted: Orders

## 2012-06-03 DIAGNOSIS — F205 Residual schizophrenia: Secondary | ICD-10-CM | POA: Diagnosis not present

## 2012-06-11 ENCOUNTER — Telehealth: Payer: Self-pay | Admitting: Family Medicine

## 2012-06-11 NOTE — Telephone Encounter (Signed)
I spoke with Clinical cytogeneticist at [facility] Ms. Murdy missed her follow up appt, also over due for repeat BMET They are to schedule her an appt within the next 2 weeks, labs will be done at that time

## 2012-06-13 DIAGNOSIS — E119 Type 2 diabetes mellitus without complications: Secondary | ICD-10-CM | POA: Diagnosis not present

## 2012-06-13 DIAGNOSIS — E1149 Type 2 diabetes mellitus with other diabetic neurological complication: Secondary | ICD-10-CM | POA: Diagnosis not present

## 2012-06-18 ENCOUNTER — Ambulatory Visit (INDEPENDENT_AMBULATORY_CARE_PROVIDER_SITE_OTHER): Payer: Medicare Other | Admitting: Family Medicine

## 2012-06-18 ENCOUNTER — Encounter: Payer: Self-pay | Admitting: Family Medicine

## 2012-06-18 VITALS — BP 130/82 | HR 74 | Resp 18 | Ht 62.0 in | Wt 205.1 lb

## 2012-06-18 DIAGNOSIS — I10 Essential (primary) hypertension: Secondary | ICD-10-CM

## 2012-06-18 DIAGNOSIS — E119 Type 2 diabetes mellitus without complications: Secondary | ICD-10-CM

## 2012-06-18 DIAGNOSIS — E870 Hyperosmolality and hypernatremia: Secondary | ICD-10-CM | POA: Diagnosis not present

## 2012-06-18 DIAGNOSIS — E785 Hyperlipidemia, unspecified: Secondary | ICD-10-CM | POA: Diagnosis not present

## 2012-06-18 DIAGNOSIS — E669 Obesity, unspecified: Secondary | ICD-10-CM

## 2012-06-18 DIAGNOSIS — F209 Schizophrenia, unspecified: Secondary | ICD-10-CM | POA: Diagnosis not present

## 2012-06-18 LAB — BASIC METABOLIC PANEL
Calcium: 9.4 mg/dL (ref 8.4–10.5)
Creat: 1.16 mg/dL — ABNORMAL HIGH (ref 0.50–1.10)
Glucose, Bld: 147 mg/dL — ABNORMAL HIGH (ref 70–99)
Sodium: 144 mEq/L (ref 135–145)

## 2012-06-18 NOTE — Assessment & Plan Note (Signed)
LDL at goal 

## 2012-06-18 NOTE — Assessment & Plan Note (Signed)
Doing well, followed by [facility]

## 2012-06-18 NOTE — Assessment & Plan Note (Signed)
Mild hypernatremia, repeat labs today

## 2012-06-18 NOTE — Progress Notes (Signed)
  Subjective:    Patient ID: [name], female    DOB: 01/14/1946, 67 y.o.   MRN: [id number]  HPI Patient here follow chronic medical problems. She has no specific concerns. She was found to have hypernatremia which was mild set of labs this was repeated this morning. Diabetes mellitus tolerating her glipizide last A1c 6.6% her fasting blood sugars have been 100-120. She has lost 13 pounds since her last visit I tried to walk more and eat less junk food Seen by podiatry last week  Review of Systems   GEN- denies fatigue, fever, weight loss,weakness, recent illness HEENT- denies eye drainage, change in vision, nasal discharge, CVS- denies chest pain, palpitations RESP- denies SOB, cough, wheeze ABD- denies N/V, change in stools, abd pain GU- denies dysuria, hematuria, dribbling, incontinence MSK- denies joint pain, muscle aches, injury Neuro- denies headache, dizziness, syncope, seizure activity      Objective:   Physical Exam   GEN- NAD, alert and oriented x3 + weight loss  HEENT- Right eye reactive, EOMI, non injected sclera, pink conjunctiva, MMM, oropharynx clear,  Neck- Supple,  CVS- RRR, no murmur RESP-CTAB EXT- No edema Pulses- Radial 2+     Assessment & Plan:

## 2012-06-18 NOTE — Assessment & Plan Note (Signed)
Well controlled 

## 2012-06-18 NOTE — Assessment & Plan Note (Signed)
Weight loss noted, pt congratulated

## 2012-06-18 NOTE — Patient Instructions (Signed)
Continue current meds I will call only if labs abnormal  Please schedule her eye appt with Dr. Larry Sierras F/U 4 months

## 2012-08-14 DIAGNOSIS — F205 Residual schizophrenia: Secondary | ICD-10-CM | POA: Diagnosis not present

## 2012-09-05 DIAGNOSIS — E1149 Type 2 diabetes mellitus with other diabetic neurological complication: Secondary | ICD-10-CM | POA: Diagnosis not present

## 2012-09-05 DIAGNOSIS — E119 Type 2 diabetes mellitus without complications: Secondary | ICD-10-CM | POA: Diagnosis not present

## 2012-09-19 ENCOUNTER — Other Ambulatory Visit: Payer: Self-pay

## 2012-09-23 ENCOUNTER — Ambulatory Visit (INDEPENDENT_AMBULATORY_CARE_PROVIDER_SITE_OTHER): Payer: Medicare Other | Admitting: Family Medicine

## 2012-09-23 ENCOUNTER — Encounter: Payer: Self-pay | Admitting: Family Medicine

## 2012-09-23 VITALS — BP 130/80 | HR 67 | Temp 99.2°F | Resp 18 | Wt 197.0 lb

## 2012-09-23 DIAGNOSIS — E119 Type 2 diabetes mellitus without complications: Secondary | ICD-10-CM

## 2012-09-23 DIAGNOSIS — R609 Edema, unspecified: Secondary | ICD-10-CM

## 2012-09-23 DIAGNOSIS — I10 Essential (primary) hypertension: Secondary | ICD-10-CM

## 2012-09-23 DIAGNOSIS — Z79899 Other long term (current) drug therapy: Secondary | ICD-10-CM

## 2012-09-23 DIAGNOSIS — G629 Polyneuropathy, unspecified: Secondary | ICD-10-CM

## 2012-09-23 DIAGNOSIS — G609 Hereditary and idiopathic neuropathy, unspecified: Secondary | ICD-10-CM

## 2012-09-23 DIAGNOSIS — E785 Hyperlipidemia, unspecified: Secondary | ICD-10-CM

## 2012-09-23 DIAGNOSIS — R600 Localized edema: Secondary | ICD-10-CM

## 2012-09-23 NOTE — Progress Notes (Signed)
  Subjective:    Patient ID: [name], female    DOB: 09-18-1945, 67 y.o.   MRN: [id number]  HPI  Patient here with leg pain and laterally for the past 2 weeks. She states she woke up one day and felt like her legs were heavy and numb and she had pain walking on them. She also felt like she had a sticking sensation on her right leg and dullness of sensation on both legs. Her legs have also been swelling a little more than usual. She's been able to ambulate without any severe pain. She has not required any pain medication. The sensation in her legs is actually improving and no current pain. She denies any knee or hip pain denies any recent falls. They have been no changes to her medications.  Review of Systems - per above   GEN- denies fatigue, fever, weight loss,weakness, recent illness MSK-+ joint pain, muscle aches, injury Neuro- denies headache, dizziness, syncope, seizure activity      Objective:   Physical Exam GEN- NAD, alert and oriented x3 Neck- Supple, no JVD CVS- RRR, no murmur RESP-CTAB EXT-  1+ edema bilat MSK- good ROM bilat knees, normal inspection, good ROM hips bilat Skin- in tact, no erythema, warm to touch bilat Neuro- Monofilament in tact bilat soles of feet, decreased sensation on shins bilat,gait normal, DTR symmetric Pulses- Radial, DP- equal bilat        Assessment & Plan:

## 2012-09-23 NOTE — Patient Instructions (Addendum)
Schedule lab visit for Friday August 15th  Keep appt for August to f/u Diabetes Please schedule her appt with Dr. Larry Sierras for her vision  Increase lasix to 2 tablets daily for 5 days  Call if legs due not improve

## 2012-09-24 DIAGNOSIS — G629 Polyneuropathy, unspecified: Secondary | ICD-10-CM | POA: Insufficient documentation

## 2012-09-24 NOTE — Assessment & Plan Note (Signed)
Increase lasix to 40mg  daily for the next 5 days

## 2012-09-24 NOTE — Assessment & Plan Note (Signed)
She has some symptoms of neuropathy, I think the swelling is causing more of the discomfort. Increase lasix and elevate No severe pain. AMbulating at baseline With her other medications and the fact she is improving will not start any other medications and recheck her at the upcoming visit

## 2012-10-25 ENCOUNTER — Other Ambulatory Visit: Payer: Medicare Other

## 2012-10-25 DIAGNOSIS — E785 Hyperlipidemia, unspecified: Secondary | ICD-10-CM | POA: Diagnosis not present

## 2012-10-25 DIAGNOSIS — Z79899 Other long term (current) drug therapy: Secondary | ICD-10-CM

## 2012-10-25 DIAGNOSIS — I10 Essential (primary) hypertension: Secondary | ICD-10-CM | POA: Diagnosis not present

## 2012-10-25 DIAGNOSIS — E119 Type 2 diabetes mellitus without complications: Secondary | ICD-10-CM

## 2012-10-25 LAB — COMPREHENSIVE METABOLIC PANEL
ALT: 8 U/L (ref 0–35)
Albumin: 4.3 g/dL (ref 3.5–5.2)
CO2: 30 mEq/L (ref 19–32)
Calcium: 9.6 mg/dL (ref 8.4–10.5)
Chloride: 108 mEq/L (ref 96–112)
Creat: 1.11 mg/dL — ABNORMAL HIGH (ref 0.50–1.10)
Potassium: 3.9 mEq/L (ref 3.5–5.3)
Total Protein: 6.7 g/dL (ref 6.0–8.3)

## 2012-10-25 LAB — TSH: TSH: 0.152 u[IU]/mL — ABNORMAL LOW (ref 0.350–4.500)

## 2012-10-25 LAB — CBC WITH DIFFERENTIAL/PLATELET
Basophils Absolute: 0 10*3/uL (ref 0.0–0.1)
Basophils Relative: 0 % (ref 0–1)
HCT: 43.4 % (ref 36.0–46.0)
Lymphocytes Relative: 18 % (ref 12–46)
MCHC: 32.5 g/dL (ref 30.0–36.0)
Neutro Abs: 5.6 10*3/uL (ref 1.7–7.7)
Neutrophils Relative %: 72 % (ref 43–77)
Platelets: 267 10*3/uL (ref 150–400)
RDW: 14.8 % (ref 11.5–15.5)
WBC: 7.8 10*3/uL (ref 4.0–10.5)

## 2012-10-25 LAB — VITAMIN B12: Vitamin B-12: 751 pg/mL (ref 211–911)

## 2012-10-25 LAB — LIPID PANEL
Cholesterol: 143 mg/dL (ref 0–200)
Total CHOL/HDL Ratio: 2.8 Ratio

## 2012-10-25 LAB — HEMOGLOBIN A1C: Mean Plasma Glucose: 126 mg/dL — ABNORMAL HIGH (ref <117)

## 2012-10-29 ENCOUNTER — Ambulatory Visit: Payer: Medicare Other | Admitting: Family Medicine

## 2012-10-29 ENCOUNTER — Ambulatory Visit (INDEPENDENT_AMBULATORY_CARE_PROVIDER_SITE_OTHER): Payer: Medicare Other | Admitting: Family Medicine

## 2012-10-29 ENCOUNTER — Encounter: Payer: Self-pay | Admitting: Family Medicine

## 2012-10-29 VITALS — BP 130/80 | HR 68 | Temp 97.8°F | Ht 61.0 in | Wt 192.0 lb

## 2012-10-29 DIAGNOSIS — E079 Disorder of thyroid, unspecified: Secondary | ICD-10-CM

## 2012-10-29 DIAGNOSIS — E119 Type 2 diabetes mellitus without complications: Secondary | ICD-10-CM | POA: Diagnosis not present

## 2012-10-29 DIAGNOSIS — R6889 Other general symptoms and signs: Secondary | ICD-10-CM

## 2012-10-29 DIAGNOSIS — E870 Hyperosmolality and hypernatremia: Secondary | ICD-10-CM | POA: Diagnosis not present

## 2012-10-29 DIAGNOSIS — R7989 Other specified abnormal findings of blood chemistry: Secondary | ICD-10-CM | POA: Insufficient documentation

## 2012-10-29 DIAGNOSIS — E785 Hyperlipidemia, unspecified: Secondary | ICD-10-CM

## 2012-10-29 DIAGNOSIS — R609 Edema, unspecified: Secondary | ICD-10-CM

## 2012-10-29 DIAGNOSIS — R600 Localized edema: Secondary | ICD-10-CM

## 2012-10-29 LAB — T3, FREE: T3, Free: 2.9 pg/mL (ref 2.3–4.2)

## 2012-10-29 NOTE — Assessment & Plan Note (Signed)
History of subclinical hypothyroidism, check T3, T4

## 2012-10-29 NOTE — Assessment & Plan Note (Signed)
Lipids normal no change to meds

## 2012-10-29 NOTE — Patient Instructions (Addendum)
I will call with thyroid labs Continue current medications Diabetes looks very good F/U 3 months

## 2012-10-29 NOTE — Assessment & Plan Note (Signed)
Check T3 and T4

## 2012-10-29 NOTE — Progress Notes (Signed)
  Subjective:    Patient ID: [name], female    DOB: May 12, 1945, 67 y.o.   MRN: [id number]  HPI Patient here to follow chronic medical problems. She was seen last month secondary to leg swelling and pain this is now resolved and she is back to her normal dose dosage of Lasix 20 mg daily. She also had fasting labs done for her diabetes mellitus and her cholesterol. These were reviewed with the patient in the office. Medications were also reviewed. She has an eye Dr. appointment set up for next week with Dr. Larry Sierras   Review of Systems  GEN- denies fatigue, fever, weight loss,weakness, recent illness HEENT- denies eye drainage, change in vision, nasal discharge, CVS- denies chest pain, palpitations RESP- denies SOB, cough, wheeze ABD- denies N/V, change in stools, abd pain GU- denies dysuria, hematuria, dribbling, incontinence MSK- denies joint pain, muscle aches, injury Neuro- denies headache, dizziness, syncope, seizure activity      Objective:   Physical Exam GEN- NAD, alert and oriented x3 Neck- Supple, no JVD CVS- RRR, no murmur RESP-CTAB EXT-  1+ edema bilat MSK- good ROM bilat knees, normal inspection, good ROM hips bilat Skin- in tact, no erythema, warm to touch bilat Neuro- Monofilament in tact bilat soles of feet, decreased sensation on shins bilat,gait normal, DTR symmetric Pulses- Radial, DP- equal bilat       Assessment & Plan:

## 2012-10-29 NOTE — Assessment & Plan Note (Signed)
Well controlled, no change to meds Urine Micro today

## 2012-10-29 NOTE — Assessment & Plan Note (Signed)
Normal sodium.

## 2012-10-29 NOTE — Assessment & Plan Note (Signed)
Now resolved, continue lasix daily

## 2012-11-01 ENCOUNTER — Encounter: Payer: Self-pay | Admitting: Family Medicine

## 2012-11-06 DIAGNOSIS — H25019 Cortical age-related cataract, unspecified eye: Secondary | ICD-10-CM | POA: Diagnosis not present

## 2012-11-06 DIAGNOSIS — H4010X0 Unspecified open-angle glaucoma, stage unspecified: Secondary | ICD-10-CM | POA: Diagnosis not present

## 2012-11-06 DIAGNOSIS — E119 Type 2 diabetes mellitus without complications: Secondary | ICD-10-CM | POA: Diagnosis not present

## 2012-11-14 DIAGNOSIS — F205 Residual schizophrenia: Secondary | ICD-10-CM | POA: Diagnosis not present

## 2012-11-14 DIAGNOSIS — E119 Type 2 diabetes mellitus without complications: Secondary | ICD-10-CM | POA: Diagnosis not present

## 2012-11-14 DIAGNOSIS — E1149 Type 2 diabetes mellitus with other diabetic neurological complication: Secondary | ICD-10-CM | POA: Diagnosis not present

## 2013-01-23 DIAGNOSIS — E1149 Type 2 diabetes mellitus with other diabetic neurological complication: Secondary | ICD-10-CM | POA: Diagnosis not present

## 2013-01-23 DIAGNOSIS — E119 Type 2 diabetes mellitus without complications: Secondary | ICD-10-CM | POA: Diagnosis not present

## 2013-01-29 ENCOUNTER — Ambulatory Visit (INDEPENDENT_AMBULATORY_CARE_PROVIDER_SITE_OTHER): Payer: Medicare Other | Admitting: Family Medicine

## 2013-01-29 VITALS — BP 110/70 | HR 78 | Temp 98.0°F | Resp 16 | Ht 60.0 in | Wt 192.0 lb

## 2013-01-29 DIAGNOSIS — I10 Essential (primary) hypertension: Secondary | ICD-10-CM | POA: Diagnosis not present

## 2013-01-29 DIAGNOSIS — E119 Type 2 diabetes mellitus without complications: Secondary | ICD-10-CM

## 2013-01-29 DIAGNOSIS — Z23 Encounter for immunization: Secondary | ICD-10-CM | POA: Diagnosis not present

## 2013-01-29 DIAGNOSIS — E669 Obesity, unspecified: Secondary | ICD-10-CM

## 2013-01-29 NOTE — Patient Instructions (Signed)
No change to medication Prevnar 13 given  FLu shot given  F/U 3 months

## 2013-01-30 DIAGNOSIS — F205 Residual schizophrenia: Secondary | ICD-10-CM | POA: Diagnosis not present

## 2013-01-31 ENCOUNTER — Encounter: Payer: Self-pay | Admitting: Family Medicine

## 2013-01-31 NOTE — Assessment & Plan Note (Signed)
A1c shows good control. Continue current medications. She was given a flu shot and Prevnar 13

## 2013-01-31 NOTE — Progress Notes (Signed)
  Subjective:    Patient ID: [name], female    DOB: [birth year], 67 y.o.   MRN: [id number]  HPI  Patient here to followup chronic medical problems. She has no specific concerns. She is doing well at the facility. Diabetes mellitus no hypoglycemia denies polyuria polydipsia. Blood sugars have been stable per report She continues to walk some and watch her diet She's been seen by psychiatry this week. No recent change in medications her mood has been stable  Review of Systems   GEN- denies fatigue, fever, weight loss,weakness, recent illness HEENT- denies eye drainage, change in vision, nasal discharge, CVS- denies chest pain, palpitations RESP- denies SOB, cough, wheeze ABD- denies N/V, change in stools, abd pain GU- denies dysuria, hematuria, dribbling, incontinence MSK- denies joint pain, muscle aches, injury Neuro- denies headache, dizziness, syncope, seizure activity      Objective:   Physical Exam  GEN- NAD, alert and oriented x3 HEENT- Right eye reactive to light, EOMI, MMM, oropharynx clear Neck- Supple,  CVS- RRR, no murmur RESP-CTAB EXT-  Trace pedaledema bilat Skin- in tact, no erythema, warm to touch bilat Pulses- Radial, DP- equal bilat        Assessment & Plan:

## 2013-01-31 NOTE — Assessment & Plan Note (Signed)
Doing well no change in medication

## 2013-01-31 NOTE — Assessment & Plan Note (Signed)
Continue to encourage activity, healthy eating

## 2013-03-28 ENCOUNTER — Other Ambulatory Visit: Payer: Self-pay | Admitting: Family Medicine

## 2013-03-28 NOTE — Telephone Encounter (Signed)
Medication refilled per protocol. 

## 2013-05-02 ENCOUNTER — Ambulatory Visit: Payer: Medicare Other | Admitting: Family Medicine

## 2013-06-25 ENCOUNTER — Ambulatory Visit (INDEPENDENT_AMBULATORY_CARE_PROVIDER_SITE_OTHER): Payer: Medicare Other | Admitting: Family Medicine

## 2013-06-25 ENCOUNTER — Encounter: Payer: Self-pay | Admitting: Family Medicine

## 2013-06-25 VITALS — BP 136/78 | HR 64 | Temp 98.6°F | Resp 16 | Ht 60.0 in | Wt 196.0 lb

## 2013-06-25 DIAGNOSIS — M549 Dorsalgia, unspecified: Secondary | ICD-10-CM | POA: Diagnosis not present

## 2013-06-25 DIAGNOSIS — R946 Abnormal results of thyroid function studies: Secondary | ICD-10-CM | POA: Diagnosis not present

## 2013-06-25 DIAGNOSIS — L600 Ingrowing nail: Secondary | ICD-10-CM

## 2013-06-25 DIAGNOSIS — E119 Type 2 diabetes mellitus without complications: Secondary | ICD-10-CM

## 2013-06-25 DIAGNOSIS — N390 Urinary tract infection, site not specified: Secondary | ICD-10-CM

## 2013-06-25 DIAGNOSIS — R7989 Other specified abnormal findings of blood chemistry: Secondary | ICD-10-CM

## 2013-06-25 LAB — URINALYSIS, ROUTINE W REFLEX MICROSCOPIC
Glucose, UA: NEGATIVE mg/dL
KETONES UR: 15 mg/dL — AB
NITRITE: NEGATIVE
PH: 7.5 (ref 5.0–8.0)
Protein, ur: 30 mg/dL — AB
Specific Gravity, Urine: 1.015 (ref 1.005–1.030)
UROBILINOGEN UA: 2 mg/dL — AB (ref 0.0–1.0)

## 2013-06-25 LAB — COMPREHENSIVE METABOLIC PANEL
ALK PHOS: 60 U/L (ref 39–117)
ALT: <8 U/L (ref 0–35)
AST: 14 U/L (ref 0–37)
Albumin: 3.7 g/dL (ref 3.5–5.2)
BILIRUBIN TOTAL: 0.3 mg/dL (ref 0.2–1.2)
BUN: 13 mg/dL (ref 6–23)
CO2: 26 mEq/L (ref 19–32)
CREATININE: 1.06 mg/dL (ref 0.50–1.10)
Calcium: 10 mg/dL (ref 8.4–10.5)
Chloride: 104 mEq/L (ref 96–112)
GLUCOSE: 78 mg/dL (ref 70–99)
Potassium: 3.8 mEq/L (ref 3.5–5.3)
Sodium: 142 mEq/L (ref 135–145)
Total Protein: 6.7 g/dL (ref 6.0–8.3)

## 2013-06-25 LAB — T3, FREE: T3 FREE: 2.6 pg/mL (ref 2.3–4.2)

## 2013-06-25 LAB — CBC W/MCH & 3 PART DIFF
HCT: 42.7 % (ref 36.0–46.0)
HEMOGLOBIN: 13.7 g/dL (ref 12.0–15.0)
Lymphocytes Relative: 21 % (ref 12–46)
Lymphs Abs: 2.2 10*3/uL (ref 0.7–4.0)
MCH: 27 pg (ref 26.0–34.0)
MCHC: 32.1 g/dL (ref 30.0–36.0)
MCV: 84.1 fL (ref 78.0–100.0)
NEUTROS PCT: 71 % (ref 43–77)
Neutro Abs: 7.5 10*3/uL (ref 1.7–7.7)
Platelets: 327 10*3/uL (ref 150–400)
RBC: 5.08 MIL/uL (ref 3.87–5.11)
RDW: 14.9 % (ref 11.5–15.5)
WBC mixed population: 0.9 10*3/uL (ref 0.1–1.8)
WBC: 10.6 10*3/uL — ABNORMAL HIGH (ref 4.0–10.5)
WBCMIXPER: 8 % (ref 3–18)

## 2013-06-25 LAB — LIPID PANEL
Cholesterol: 125 mg/dL (ref 0–200)
HDL: 35 mg/dL — AB (ref >39)
LDL CALC: 66 mg/dL (ref 0–99)
TRIGLYCERIDES: 121 mg/dL (ref <150)
Total CHOL/HDL Ratio: 3.6 Ratio
VLDL: 24 mg/dL (ref 0–40)

## 2013-06-25 LAB — URINALYSIS, MICROSCOPIC ONLY
Casts: NONE SEEN
Crystals: NONE SEEN

## 2013-06-25 LAB — HEMOGLOBIN A1C
Hgb A1c MFr Bld: 5.9 % — ABNORMAL HIGH (ref <5.7)
Mean Plasma Glucose: 123 mg/dL — ABNORMAL HIGH (ref <117)

## 2013-06-25 LAB — T4, FREE: Free T4: 1.27 ng/dL (ref 0.80–1.80)

## 2013-06-25 LAB — TSH: TSH: 0.105 u[IU]/mL — AB (ref 0.350–4.500)

## 2013-06-25 MED ORDER — CIPROFLOXACIN HCL 500 MG PO TABS: 500.0000 mg | ORAL_TABLET | Freq: Two times a day (BID) | ORAL | Status: DC

## 2013-06-25 NOTE — Patient Instructions (Addendum)
Epson salt soaks to left foot twice a day Referral to podiatry ANtibiotics for urine infections TYlenol for pain OR ULTRAM on file for pain F/U 3 months

## 2013-06-25 NOTE — Progress Notes (Signed)
Patient ID: [name], female   DOB: Jul 02, 1945, 68 y.o.   MRN: [id number]   Subjective:    Patient ID: [name], female    DOB: 10-Dec-1945, 68 y.o.   MRN: [id number]  Patient presents for Illness  patient here for her facility. She has had decreased appetite for the past week fatigue and some back pain. States she just doesn't feel like herself. She denies any dysuria. Denies any cough chest pain shortness of breath. The facility notes that she has not been herself and more fatigued and sleepy than normal. She's also not eating very well. Her blood sugars have looked good from 92-120 fasting. Her blood pressures have been good and her vitals otherwise stable. Medications were reviewed   They also concerned about ingrown toenail on her right toe she needs a new referral so that this can be evaluated by her podiatrist    Review Of Systems:  GEN-+fatigue, fever, weight loss,weakness, recent illness HEENT- denies eye drainage, change in vision, nasal discharge, CVS- denies chest pain, palpitations RESP- denies SOB, cough, wheeze ABD- denies N/V, change in stools, abd pain GU- denies dysuria, hematuria, dribbling, incontinence MSK- + joint pain, muscle aches, injury Neuro- denies headache, dizziness, syncope, seizure activity       Objective:    BP 136/78  Pulse 64  Temp(Src) 98.6 F (37 C) (Oral)  Resp 16  Ht 5' (1.524 m)  Wt 196 lb (88.905 kg)  BMI 38.28 kg/m2 GEN- NAD, alert and oriented x3 HEENT- Right eye reactive to light, EOMI, MMM, oropharynx clear Neck- Supple,  CVS- RRR, no murmur RESP-CTAB ABD-NABS,soft,NT,ND MSK- Spine NT, Kyphosis, no CVA tenderness Skin- Left ingrown toenail, mild erythema, no pus seen EXT-  Trace pedal dema bilat Pulses- Radial, DP- equal bilat      Assessment & Plan:      Problem List Items Addressed This Visit   Diabetes mellitus   Relevant Orders      Hemoglobin A1c      Comprehensive metabolic panel      Lipid panel       CBC w/MCH & 3 Part Diff (Completed)   Abnormal TSH   Relevant Orders      TSH      T3, Free      T4, Free    Other Visit Diagnoses   UTI (urinary tract infection)    -  Primary    Start Cipro twice a day urine culture was sent    Relevant Orders       Urine culture    Back pain        I think this is secondary today kidney infection. She does have tramadol and Tylenol as needed no red flags    Relevant Orders       Urinalysis, Routine w reflex microscopic (Completed)       Note: This dictation was prepared with Dragon dictation along with smaller phrase technology. Any transcriptional errors that result from this process are unintentional.

## 2013-06-26 LAB — URINE CULTURE
COLONY COUNT: NO GROWTH
ORGANISM ID, BACTERIA: NO GROWTH

## 2013-07-01 ENCOUNTER — Encounter: Payer: Self-pay | Admitting: Family Medicine

## 2013-07-01 DIAGNOSIS — E0590 Thyrotoxicosis, unspecified without thyrotoxic crisis or storm: Secondary | ICD-10-CM | POA: Insufficient documentation

## 2013-07-24 DIAGNOSIS — E1149 Type 2 diabetes mellitus with other diabetic neurological complication: Secondary | ICD-10-CM | POA: Diagnosis not present

## 2013-07-24 DIAGNOSIS — E119 Type 2 diabetes mellitus without complications: Secondary | ICD-10-CM | POA: Diagnosis not present

## 2013-08-28 DIAGNOSIS — H2510 Age-related nuclear cataract, unspecified eye: Secondary | ICD-10-CM | POA: Diagnosis not present

## 2013-08-28 DIAGNOSIS — H4010X0 Unspecified open-angle glaucoma, stage unspecified: Secondary | ICD-10-CM | POA: Diagnosis not present

## 2013-08-28 DIAGNOSIS — E119 Type 2 diabetes mellitus without complications: Secondary | ICD-10-CM | POA: Diagnosis not present

## 2013-09-24 ENCOUNTER — Ambulatory Visit: Payer: Medicare Other | Admitting: Family Medicine

## 2013-10-01 ENCOUNTER — Ambulatory Visit (INDEPENDENT_AMBULATORY_CARE_PROVIDER_SITE_OTHER): Payer: Medicare Other | Admitting: Family Medicine

## 2013-10-01 ENCOUNTER — Encounter: Payer: Self-pay | Admitting: Family Medicine

## 2013-10-01 VITALS — BP 122/76 | HR 78 | Temp 98.1°F | Resp 16 | Ht 60.0 in | Wt 161.0 lb

## 2013-10-01 DIAGNOSIS — E119 Type 2 diabetes mellitus without complications: Secondary | ICD-10-CM

## 2013-10-01 DIAGNOSIS — R634 Abnormal weight loss: Secondary | ICD-10-CM

## 2013-10-01 DIAGNOSIS — Z1231 Encounter for screening mammogram for malignant neoplasm of breast: Secondary | ICD-10-CM

## 2013-10-01 DIAGNOSIS — I10 Essential (primary) hypertension: Secondary | ICD-10-CM | POA: Diagnosis not present

## 2013-10-01 DIAGNOSIS — E0590 Thyrotoxicosis, unspecified without thyrotoxic crisis or storm: Secondary | ICD-10-CM

## 2013-10-01 LAB — COMPREHENSIVE METABOLIC PANEL
ALK PHOS: 69 U/L (ref 39–117)
AST: 9 U/L (ref 0–37)
Albumin: 3.7 g/dL (ref 3.5–5.2)
BUN: 18 mg/dL (ref 6–23)
CO2: 27 meq/L (ref 19–32)
Calcium: 9.6 mg/dL (ref 8.4–10.5)
Chloride: 107 mEq/L (ref 96–112)
Creat: 1.1 mg/dL (ref 0.50–1.10)
Glucose, Bld: 88 mg/dL (ref 70–99)
Potassium: 3.8 mEq/L (ref 3.5–5.3)
SODIUM: 144 meq/L (ref 135–145)
TOTAL PROTEIN: 6.5 g/dL (ref 6.0–8.3)
Total Bilirubin: 0.3 mg/dL (ref 0.2–1.2)

## 2013-10-01 LAB — CBC WITH DIFFERENTIAL/PLATELET
Basophils Absolute: 0 10*3/uL (ref 0.0–0.1)
Basophils Relative: 0 % (ref 0–1)
EOS ABS: 0.2 10*3/uL (ref 0.0–0.7)
Eosinophils Relative: 3 % (ref 0–5)
HCT: 37.2 % (ref 36.0–46.0)
Hemoglobin: 12.3 g/dL (ref 12.0–15.0)
LYMPHS ABS: 2.2 10*3/uL (ref 0.7–4.0)
Lymphocytes Relative: 29 % (ref 12–46)
MCH: 26.4 pg (ref 26.0–34.0)
MCHC: 33.1 g/dL (ref 30.0–36.0)
MCV: 79.8 fL (ref 78.0–100.0)
Monocytes Absolute: 0.7 10*3/uL (ref 0.1–1.0)
Monocytes Relative: 9 % (ref 3–12)
NEUTROS PCT: 59 % (ref 43–77)
Neutro Abs: 4.5 10*3/uL (ref 1.7–7.7)
PLATELETS: 264 10*3/uL (ref 150–400)
RBC: 4.66 MIL/uL (ref 3.87–5.11)
RDW: 14.7 % (ref 11.5–15.5)
WBC: 7.7 10*3/uL (ref 4.0–10.5)

## 2013-10-01 LAB — HEMOGLOBIN A1C
HEMOGLOBIN A1C: 6.1 % — AB (ref <5.7)
Mean Plasma Glucose: 128 mg/dL — ABNORMAL HIGH (ref <117)

## 2013-10-01 LAB — T3, FREE: T3, Free: 2.7 pg/mL (ref 2.3–4.2)

## 2013-10-01 LAB — T4, FREE: Free T4: 1.09 ng/dL (ref 0.80–1.80)

## 2013-10-01 LAB — TSH: TSH: 0.386 u[IU]/mL (ref 0.350–4.500)

## 2013-10-01 NOTE — Assessment & Plan Note (Signed)
Her weight loss I'm concerned about her subclinical hypothyroidism. I will recheck her levels today we will get her set up to see endocrinology

## 2013-10-01 NOTE — Patient Instructions (Signed)
Continue current medications Record blood pressure once a week We will call with labs  Mammogram to be scheduled F/U 3 months for regular appointment

## 2013-10-01 NOTE — Assessment & Plan Note (Signed)
Well controlled 

## 2013-10-01 NOTE — Assessment & Plan Note (Signed)
A recheck her A1c as well as metabolic panel today

## 2013-10-01 NOTE — Progress Notes (Signed)
Patient ID: [name], female   DOB: Jul 28, 1945, 68 y.o.   MRN: [id number]   Subjective:    Patient ID: [name], female    DOB: [birth year], 68 y.o.   MRN: [id number]  Patient presents for 3 month F/U  patient here to follow chronic medical problems. She has no specific concerns today. Have noted that she has a 35 pound weight loss since her last visit in April 2015. She states her after tight decreased significantly after the urine infection however I was unaware of this. She states her appetite improved recently and that she is put back on some weight. She's also been drinking Ensure.  Diabetes mellitus her blood sugar has been 99 the 125 fasting the highest blood sugar 207 she is no longer on diabetic medications as her A1c was 5.9%.  Her colonoscopy is up to date she is overdue for mammogram. Her medications were reviewed there've been no other changes    Review Of Systems:  GEN- denies fatigue, fever, +weight loss,weakness, recent illness HEENT- denies eye drainage, change in vision, nasal discharge, CVS- denies chest pain, palpitations RESP- denies SOB, cough, wheeze ABD- denies N/V, change in stools, abd pain GU- denies dysuria, hematuria, dribbling, incontinence MSK- denies joint pain, muscle aches, injury Neuro- denies headache, dizziness, syncope, seizure activity       Objective:    BP 122/76  Pulse 78  Temp(Src) 98.1 F (36.7 C) (Oral)  Resp 16  Ht 5' (1.524 m)  Wt 161 lb (73.029 kg)  BMI 31.44 kg/m2 GEN- NAD, alert and oriented x3 HEENT- Right eye reactive to light, EOMI, MMM, oropharynx clear Neck- Supple, no nodule palpable CVS- RRR, no murmur RESP-CTAB ABD-NABS,soft,NT,ND MSK- Spine NT, Kyphosis, no CVA tenderness EXT-  Trace pedal dema bilat Pulses- Radial, DP- equal bilat      Assessment & Plan:      Problem List Items Addressed This Visit   Subclinical hyperthyroidism   Relevant Orders      TSH (Completed)      T3, free (Completed)       T4, free (Completed)   Loss of weight - Primary   HTN (hypertension)   Diabetes mellitus   Relevant Orders      CBC with Differential (Completed)      Comprehensive metabolic panel (Completed)      Hemoglobin A1c (Completed)      Note: This dictation was prepared with Dragon dictation along with smaller phrase technology. Any transcriptional errors that result from this process are unintentional.

## 2013-10-01 NOTE — Assessment & Plan Note (Addendum)
She's had a significant amount of weight loss in the past 3 months. I did call and speak with the home he states that she has not been sick she's been doing very well her appetite is up and down but they have not noticed anything out of the ordinary. I will also get her scheduled for mammogram her colonoscopy is up-to-date. She is status post hysterectomy.

## 2013-10-04 ENCOUNTER — Encounter: Payer: Self-pay | Admitting: *Deleted

## 2013-10-10 ENCOUNTER — Ambulatory Visit (HOSPITAL_COMMUNITY)
Admission: RE | Admit: 2013-10-10 | Discharge: 2013-10-10 | Disposition: A | Discharge status: Home / Self Care | Payer: Medicare Other | Source: Ambulatory Visit | Attending: Family Medicine | Admitting: Family Medicine

## 2013-10-10 DIAGNOSIS — Z1231 Encounter for screening mammogram for malignant neoplasm of breast: Secondary | ICD-10-CM | POA: Diagnosis not present

## 2013-12-25 DIAGNOSIS — F259 Schizoaffective disorder, unspecified: Secondary | ICD-10-CM | POA: Diagnosis not present

## 2013-12-25 DIAGNOSIS — Z23 Encounter for immunization: Secondary | ICD-10-CM | POA: Diagnosis not present

## 2014-01-02 ENCOUNTER — Ambulatory Visit (INDEPENDENT_AMBULATORY_CARE_PROVIDER_SITE_OTHER): Payer: Medicare Other | Admitting: Family Medicine

## 2014-01-02 ENCOUNTER — Encounter: Payer: Self-pay | Admitting: Family Medicine

## 2014-01-02 VITALS — BP 128/74 | HR 62 | Temp 97.7°F | Resp 14 | Ht 60.0 in | Wt 152.0 lb

## 2014-01-02 DIAGNOSIS — I10 Essential (primary) hypertension: Secondary | ICD-10-CM

## 2014-01-02 DIAGNOSIS — E119 Type 2 diabetes mellitus without complications: Secondary | ICD-10-CM

## 2014-01-02 DIAGNOSIS — E0590 Thyrotoxicosis, unspecified without thyrotoxic crisis or storm: Secondary | ICD-10-CM | POA: Diagnosis not present

## 2014-01-02 DIAGNOSIS — S30821A Blister (nonthermal) of abdominal wall, initial encounter: Secondary | ICD-10-CM | POA: Diagnosis not present

## 2014-01-02 DIAGNOSIS — Z23 Encounter for immunization: Secondary | ICD-10-CM | POA: Diagnosis not present

## 2014-01-02 DIAGNOSIS — R634 Abnormal weight loss: Secondary | ICD-10-CM | POA: Diagnosis not present

## 2014-01-02 DIAGNOSIS — R7989 Other specified abnormal findings of blood chemistry: Secondary | ICD-10-CM

## 2014-01-02 LAB — CBC WITH DIFFERENTIAL/PLATELET
BASOS PCT: 0 % (ref 0–1)
Basophils Absolute: 0 10*3/uL (ref 0.0–0.1)
EOS ABS: 0.3 10*3/uL (ref 0.0–0.7)
EOS PCT: 3 % (ref 0–5)
HCT: 38.2 % (ref 36.0–46.0)
Hemoglobin: 12.4 g/dL (ref 12.0–15.0)
LYMPHS ABS: 1.9 10*3/uL (ref 0.7–4.0)
Lymphocytes Relative: 23 % (ref 12–46)
MCH: 26.3 pg (ref 26.0–34.0)
MCHC: 32.5 g/dL (ref 30.0–36.0)
MCV: 80.9 fL (ref 78.0–100.0)
Monocytes Absolute: 0.7 10*3/uL (ref 0.1–1.0)
Monocytes Relative: 8 % (ref 3–12)
Neutro Abs: 5.5 10*3/uL (ref 1.7–7.7)
Neutrophils Relative %: 66 % (ref 43–77)
PLATELETS: 279 10*3/uL (ref 150–400)
RBC: 4.72 MIL/uL (ref 3.87–5.11)
RDW: 14.8 % (ref 11.5–15.5)
WBC: 8.4 10*3/uL (ref 4.0–10.5)

## 2014-01-02 LAB — COMPREHENSIVE METABOLIC PANEL
ALT: <8 U/L (ref 0–35)
AST: 11 U/L (ref 0–37)
Albumin: 3.7 g/dL (ref 3.5–5.2)
Alkaline Phosphatase: 65 U/L (ref 39–117)
BUN: 19 mg/dL (ref 6–23)
CO2: 27 meq/L (ref 19–32)
CREATININE: 1.2 mg/dL — AB (ref 0.50–1.10)
Calcium: 9.8 mg/dL (ref 8.4–10.5)
Chloride: 107 mEq/L (ref 96–112)
Glucose, Bld: 99 mg/dL (ref 70–99)
Potassium: 3.8 mEq/L (ref 3.5–5.3)
Sodium: 144 mEq/L (ref 135–145)
Total Bilirubin: 0.3 mg/dL (ref 0.2–1.2)
Total Protein: 6.2 g/dL (ref 6.0–8.3)

## 2014-01-02 LAB — HEMOGLOBIN A1C
HEMOGLOBIN A1C: 6 % — AB (ref <5.7)
Mean Plasma Glucose: 126 mg/dL — ABNORMAL HIGH (ref <117)

## 2014-01-02 LAB — TSH: TSH: 0.21 u[IU]/mL — ABNORMAL LOW (ref 0.350–4.500)

## 2014-01-02 LAB — T4, FREE: Free T4: 0.96 ng/dL (ref 0.80–1.80)

## 2014-01-02 LAB — T3, FREE: T3, Free: 2.7 pg/mL (ref 2.3–4.2)

## 2014-01-02 NOTE — Assessment & Plan Note (Signed)
Resolved, no sign of infeciton

## 2014-01-02 NOTE — Assessment & Plan Note (Addendum)
Thyroid studies came back before end of day suggestion hyperthyroidism, i will schedule ultrasound of neck, get appt with endocrinology

## 2014-01-02 NOTE — Assessment & Plan Note (Signed)
Diet controlled, recheck A1C  

## 2014-01-02 NOTE — Assessment & Plan Note (Signed)
Ongoing weight loss now down 43 pounds in the past 6 months. Initially I was going to CT of chest and abdomen as well as the labs are normal however her thyroid studies did come back abnormal today therefore must start with an ultrasound/re-uptake scan of the neck to evaluate for hyperthyroidism

## 2014-01-02 NOTE — Progress Notes (Signed)
Patient ID: [name], female   DOB: February 28, 1946, 68 y.o.   MRN: [id number]   Subjective:    Patient ID: [name], female    DOB: Aug 06, 1945, 68 y.o.   MRN: [id number]  Patient presents for 3 month F/U and Blister to Abdomen  patient here to follow chronic medical problems. She has no specific concerns today. She did have a blister on her abdomen about 2 weeks ago it drained clear fluid and now just has a scab on it. She's not had any fever any upper rest for illness. Her appetite has been good and she's eaten a regular basis. Regarding her bowels they're moving well. She had one day where she had diarrhea but that is all. She continues to lose weight over the past 3 months she is also told 43 pounds which appears to be unintentional. Her thyroid labs were checked a couple months ago toes were also normal she does have a history of subclinical hyperthyroidism. Her mammogram was also done which was normal her colonoscopy was done 2 years ago and was normal. She is status post hysterectomy    Review Of Systems:  GEN- denies fatigue, fever, weight loss,weakness, recent illness HEENT- denies eye drainage, change in vision, nasal discharge, CVS- denies chest pain, palpitations RESP- denies SOB, cough, wheeze ABD- denies N/V, change in stools, abd pain GU- denies dysuria, hematuria, dribbling, incontinence MSK- denies joint pain, muscle aches, injury Neuro- denies headache, dizziness, syncope, seizure activity       Objective:    BP 128/74  Pulse 62  Temp(Src) 97.7 F (36.5 C) (Oral)  Resp 14  Ht 5' (1.524 m)  Wt 152 lb (68.947 kg)  BMI 29.69 kg/m2 GEN- NAD, alert and oriented x3, 10lb weight loss HEENT- Right eye reactive to light, EOMI, MMM, oropharynx clear Neck- Supple, no nodule palpable, no thyromegaly CVS- RRR, no murmur RESP-CTAB ABD-NABS,soft,NT,ND Skin- 2cm right of umbilicus- scabbed lesions, no erythema, no drainage EXT-  No edema Pulses- Radial, DP- equal  bilat      Assessment & Plan:      Problem List Items Addressed This Visit   Subclinical hyperthyroidism   Relevant Orders      CBC with Differential      Comprehensive metabolic panel      T3, free      T4, free      TSH   Loss of weight   HTN (hypertension)   Relevant Orders      CBC with Differential      Comprehensive metabolic panel   Diabetes mellitus   Relevant Orders      Hemoglobin A1c   Abnormal TSH - Primary      Note: This dictation was prepared with Dragon dictation along with smaller phrase technology. Any transcriptional errors that result from this process are unintentional.

## 2014-01-02 NOTE — Patient Instructions (Signed)
Labs to be checked  CT of chest and abdomen to be done for unexplained weight loss appt with foot doctor Flu shot given F/U  3 months

## 2014-01-02 NOTE — Assessment & Plan Note (Signed)
Blood pressure well controlled

## 2014-01-06 ENCOUNTER — Telehealth: Payer: Self-pay | Admitting: Family Medicine

## 2014-01-06 ENCOUNTER — Encounter: Payer: Self-pay | Admitting: Family Medicine

## 2014-01-06 NOTE — Telephone Encounter (Signed)
Pt to pts caregiver about her about her Thyroid scan at [facility], sh is aware.

## 2014-01-06 NOTE — Telephone Encounter (Signed)
Ms galloway calling for Beila calling to check the status of the ct scan of the thyroid  Please call her back at [phone]

## 2014-01-06 NOTE — Telephone Encounter (Signed)
Daughter concerned about mother.  Has lost a lot of weight and has not been eating.  Mother told daughter something about a "Tumor"???  Patient told daughter you were going to call her??

## 2014-01-06 NOTE — Telephone Encounter (Signed)
Discussed results with pt daughter, work up for weight loss, hyperthyroidism

## 2014-01-08 ENCOUNTER — Encounter (HOSPITAL_COMMUNITY): Payer: Self-pay

## 2014-01-08 ENCOUNTER — Encounter (HOSPITAL_COMMUNITY)
Admission: RE | Admit: 2014-01-08 | Discharge: 2014-01-08 | Disposition: A | Discharge status: Home / Self Care | Payer: Medicare Other | Source: Ambulatory Visit | Attending: Family Medicine | Admitting: Family Medicine

## 2014-01-08 DIAGNOSIS — R634 Abnormal weight loss: Secondary | ICD-10-CM | POA: Insufficient documentation

## 2014-01-08 DIAGNOSIS — E0590 Thyrotoxicosis, unspecified without thyrotoxic crisis or storm: Secondary | ICD-10-CM | POA: Insufficient documentation

## 2014-01-08 MED ORDER — SODIUM IODIDE I 131 CAPSULE
12.0000 | Freq: Once | INTRAVENOUS | Status: AC | PRN
  Administered 2014-01-08: 12 via ORAL

## 2014-01-09 ENCOUNTER — Encounter (HOSPITAL_COMMUNITY): Payer: Self-pay

## 2014-01-09 ENCOUNTER — Encounter (HOSPITAL_COMMUNITY)
Admission: RE | Admit: 2014-01-09 | Discharge: 2014-01-09 | Disposition: A | Discharge status: Home / Self Care | Payer: Medicare Other | Source: Ambulatory Visit | Attending: Family Medicine | Admitting: Family Medicine

## 2014-01-09 DIAGNOSIS — R634 Abnormal weight loss: Secondary | ICD-10-CM | POA: Diagnosis not present

## 2014-01-09 DIAGNOSIS — E0590 Thyrotoxicosis, unspecified without thyrotoxic crisis or storm: Secondary | ICD-10-CM | POA: Diagnosis not present

## 2014-01-09 MED ORDER — SODIUM PERTECHNETATE TC 99M INJECTION
10.0000 | Freq: Once | INTRAVENOUS | Status: AC | PRN
Start: 2014-01-09 — End: 2014-01-09
  Administered 2014-01-09: 10 via INTRAVENOUS

## 2014-01-12 DIAGNOSIS — L609 Nail disorder, unspecified: Secondary | ICD-10-CM | POA: Diagnosis not present

## 2014-01-12 DIAGNOSIS — I739 Peripheral vascular disease, unspecified: Secondary | ICD-10-CM | POA: Diagnosis not present

## 2014-01-12 DIAGNOSIS — E109 Type 1 diabetes mellitus without complications: Secondary | ICD-10-CM | POA: Diagnosis not present

## 2014-01-14 ENCOUNTER — Other Ambulatory Visit: Payer: Self-pay | Admitting: *Deleted

## 2014-01-14 DIAGNOSIS — R634 Abnormal weight loss: Secondary | ICD-10-CM

## 2014-01-14 DIAGNOSIS — E042 Nontoxic multinodular goiter: Secondary | ICD-10-CM

## 2014-01-14 DIAGNOSIS — E0590 Thyrotoxicosis, unspecified without thyrotoxic crisis or storm: Secondary | ICD-10-CM

## 2014-01-23 DIAGNOSIS — E079 Disorder of thyroid, unspecified: Secondary | ICD-10-CM | POA: Diagnosis not present

## 2014-02-11 ENCOUNTER — Other Ambulatory Visit (HOSPITAL_COMMUNITY): Payer: Self-pay | Admitting: "Endocrinology

## 2014-02-11 DIAGNOSIS — E042 Nontoxic multinodular goiter: Secondary | ICD-10-CM

## 2014-02-12 ENCOUNTER — Ambulatory Visit: Payer: Medicare Other | Admitting: Nutrition

## 2014-02-16 ENCOUNTER — Ambulatory Visit: Payer: Medicare Other | Admitting: Nutrition

## 2014-02-19 ENCOUNTER — Ambulatory Visit (HOSPITAL_COMMUNITY): Admission: RE | Admit: 2014-02-19 | Payer: Medicare Other | Source: Ambulatory Visit

## 2014-02-19 ENCOUNTER — Ambulatory Visit (HOSPITAL_COMMUNITY): Payer: Medicare Other

## 2014-02-24 ENCOUNTER — Ambulatory Visit (HOSPITAL_COMMUNITY)
Admission: RE | Admit: 2014-02-24 | Discharge: 2014-02-24 | Disposition: A | Discharge status: Home / Self Care | Payer: Medicare Other | Source: Ambulatory Visit | Attending: "Endocrinology | Admitting: "Endocrinology

## 2014-02-24 DIAGNOSIS — E042 Nontoxic multinodular goiter: Secondary | ICD-10-CM | POA: Diagnosis not present

## 2014-03-18 DIAGNOSIS — F259 Schizoaffective disorder, unspecified: Secondary | ICD-10-CM | POA: Diagnosis not present

## 2014-03-19 DIAGNOSIS — E079 Disorder of thyroid, unspecified: Secondary | ICD-10-CM | POA: Diagnosis not present

## 2014-04-07 ENCOUNTER — Ambulatory Visit: Payer: Medicare Other | Admitting: Family Medicine

## 2014-04-15 DIAGNOSIS — H2511 Age-related nuclear cataract, right eye: Secondary | ICD-10-CM | POA: Diagnosis not present

## 2014-04-15 DIAGNOSIS — E119 Type 2 diabetes mellitus without complications: Secondary | ICD-10-CM | POA: Diagnosis not present

## 2014-04-15 DIAGNOSIS — H25041 Posterior subcapsular polar age-related cataract, right eye: Secondary | ICD-10-CM | POA: Diagnosis not present

## 2014-06-04 DIAGNOSIS — H2511 Age-related nuclear cataract, right eye: Secondary | ICD-10-CM | POA: Diagnosis not present

## 2014-06-04 DIAGNOSIS — H538 Other visual disturbances: Secondary | ICD-10-CM | POA: Diagnosis not present

## 2014-06-10 DIAGNOSIS — E079 Disorder of thyroid, unspecified: Secondary | ICD-10-CM | POA: Diagnosis not present

## 2014-06-12 ENCOUNTER — Other Ambulatory Visit: Payer: Self-pay | Admitting: Family Medicine

## 2014-06-12 NOTE — Telephone Encounter (Signed)
Medication filled x1 with no refills.   Requires office visit before any further refills can be given.   Letter sent.  

## 2014-06-19 ENCOUNTER — Ambulatory Visit (INDEPENDENT_AMBULATORY_CARE_PROVIDER_SITE_OTHER): Payer: Medicare Other | Admitting: Family Medicine

## 2014-06-19 ENCOUNTER — Encounter: Payer: Self-pay | Admitting: Family Medicine

## 2014-06-19 VITALS — BP 110/68 | HR 72 | Temp 98.6°F | Resp 14 | Ht 60.0 in | Wt 148.0 lb

## 2014-06-19 DIAGNOSIS — E079 Disorder of thyroid, unspecified: Secondary | ICD-10-CM | POA: Diagnosis not present

## 2014-06-19 DIAGNOSIS — E0590 Thyrotoxicosis, unspecified without thyrotoxic crisis or storm: Secondary | ICD-10-CM

## 2014-06-19 DIAGNOSIS — I10 Essential (primary) hypertension: Secondary | ICD-10-CM | POA: Diagnosis not present

## 2014-06-19 DIAGNOSIS — J302 Other seasonal allergic rhinitis: Secondary | ICD-10-CM

## 2014-06-19 DIAGNOSIS — N393 Stress incontinence (female) (male): Secondary | ICD-10-CM

## 2014-06-19 DIAGNOSIS — E119 Type 2 diabetes mellitus without complications: Secondary | ICD-10-CM

## 2014-06-19 DIAGNOSIS — G629 Polyneuropathy, unspecified: Secondary | ICD-10-CM | POA: Diagnosis not present

## 2014-06-19 MED ORDER — LORATADINE 10 MG PO TABS: 10.0000 mg | ORAL_TABLET | Freq: Every day | ORAL | Status: DC | PRN

## 2014-06-19 MED ORDER — BLOOD GLUCOSE TEST VI STRP: ORAL_STRIP | Status: AC

## 2014-06-19 NOTE — Patient Instructions (Signed)
Continue current medication Claritin for allergies We will call with lab results Diabetic shoes to [employer] F/U 4 months

## 2014-06-19 NOTE — Progress Notes (Signed)
Patient ID: [name], female   DOB: 07-06-1945, 69 y.o.   MRN: [id number]   Subjective:    Patient ID: [name], female    DOB: March 15, 1945, 69 y.o.   MRN: [id number]  Patient presents for F/U and Diabetic Foot Exam  patient to follow-up diabetes mellitus blood sugars at the facility have ranged from 98-120 fasting her diabetes is diet controlled. Her blood pressure is also been well-controlled. She saw endocrinology this morning secondary to her hyperthyroidism her weight has been steady over the past few months and she did not require an ablation she states that they had labs this morning which were stable for her thyroid. She also saw her psychiatrist yesterday and there were no changes to her medications. They do request a new prescription for diabetic shoes for her. Her only other complaint is some runny nose and mild sneezing for the past few days no significant cough no fever    Review Of Systems:  GEN- denies fatigue, fever, weight loss,weakness, recent illness HEENT- denies eye drainage, change in vision, +nasal discharge, CVS- denies chest pain, palpitations RESP- denies SOB, cough, wheeze ABD- denies N/V, change in stools, abd pain GU- denies dysuria, hematuria, dribbling, incontinence MSK- + joint pain, muscle aches, injury Neuro- denies headache, dizziness, syncope, seizure activity       Objective:    BP 110/68 mmHg  Pulse 72  Temp(Src) 98.6 F (37 C) (Oral)  Resp 14  Ht 5' (1.524 m)  Wt 148 lb (67.132 kg)  BMI 28.90 kg/m2 GEN- NAD, alert and oriented x3 HEENT- Right eye reactive to light, EOMI, MMM, oropharynx clear, nares clear rhinorrhea Neck- Supple, no nodule palpable CVS- RRR, no murmur RESP-CTAB EXT-  No edema bilat Pulses- Radial, DP- equal bilat      Assessment & Plan:      Problem List Items Addressed This Visit      Unprioritized   Subclinical hyperthyroidism   Seasonal allergies   HTN (hypertension)   Diabetes mellitus   Relevant Orders   Basic metabolic panel   Hemoglobin A1c   Lipid panel   Bladder incontinence - Primary      Note: This dictation was prepared with Dragon dictation along with smaller phrase technology. Any transcriptional errors that result from this process are unintentional.

## 2014-06-20 LAB — BASIC METABOLIC PANEL
BUN: 18 mg/dL (ref 6–23)
CO2: 26 meq/L (ref 19–32)
Calcium: 8.6 mg/dL (ref 8.4–10.5)
Chloride: 108 mEq/L (ref 96–112)
Creat: 1.16 mg/dL — ABNORMAL HIGH (ref 0.50–1.10)
GLUCOSE: 105 mg/dL — AB (ref 70–99)
POTASSIUM: 3.7 meq/L (ref 3.5–5.3)
SODIUM: 143 meq/L (ref 135–145)

## 2014-06-20 LAB — HEMOGLOBIN A1C
HEMOGLOBIN A1C: 6.1 % — AB (ref <5.7)
MEAN PLASMA GLUCOSE: 128 mg/dL — AB (ref <117)

## 2014-06-20 LAB — LIPID PANEL
CHOL/HDL RATIO: 3.3 ratio
Cholesterol: 125 mg/dL (ref 0–200)
HDL: 38 mg/dL — ABNORMAL LOW (ref >=46)
LDL Cholesterol: 65 mg/dL (ref 0–99)
Triglycerides: 111 mg/dL (ref <150)
VLDL: 22 mg/dL (ref 0–40)

## 2014-06-21 ENCOUNTER — Encounter: Payer: Self-pay | Admitting: Family Medicine

## 2014-06-21 NOTE — Assessment & Plan Note (Signed)
Blood pressure well controlled

## 2014-06-21 NOTE — Assessment & Plan Note (Signed)
Currently controlled, will obtain new pair of diabetc shoes

## 2014-06-21 NOTE — Assessment & Plan Note (Signed)
Will add anti-histamine as needed for allergies

## 2014-06-21 NOTE — Assessment & Plan Note (Addendum)
Diabetes has been well controlled with diet, will recheck A1C Diabetic shoes, she will f/u with podiatry

## 2014-06-21 NOTE — Assessment & Plan Note (Signed)
Followed by endocrinology, appears levels are stable, weight looks okay

## 2014-06-22 ENCOUNTER — Encounter: Payer: Self-pay | Admitting: *Deleted

## 2014-07-13 ENCOUNTER — Other Ambulatory Visit: Payer: Self-pay | Admitting: Family Medicine

## 2014-07-16 NOTE — Telephone Encounter (Signed)
Refill appropriate and filled per protocol. 

## 2014-07-17 ENCOUNTER — Telehealth: Payer: Self-pay | Admitting: *Deleted

## 2014-07-17 MED ORDER — TEMAZEPAM 15 MG PO CAPS: 15.0000 mg | ORAL_CAPSULE | Freq: Every evening | ORAL | Status: DC | PRN

## 2014-07-17 NOTE — Telephone Encounter (Signed)
Okay to refill, give 3  

## 2014-07-17 NOTE — Telephone Encounter (Signed)
Received fax requesting refill on Temazepam.   Ok to refill?

## 2014-07-17 NOTE — Telephone Encounter (Signed)
Medication called to pharmacy. 

## 2014-09-22 DIAGNOSIS — F259 Schizoaffective disorder, unspecified: Secondary | ICD-10-CM | POA: Diagnosis not present

## 2014-10-19 ENCOUNTER — Ambulatory Visit: Payer: Medicare Other | Admitting: Family Medicine

## 2014-12-01 ENCOUNTER — Other Ambulatory Visit: Payer: Self-pay | Admitting: Family Medicine

## 2014-12-01 MED ORDER — LATANOPROST 0.005 % OP SOLN: OPHTHALMIC | Status: DC

## 2014-12-01 NOTE — Telephone Encounter (Signed)
Medication refilled per protocol. 

## 2014-12-10 DIAGNOSIS — F259 Schizoaffective disorder, unspecified: Secondary | ICD-10-CM | POA: Diagnosis not present

## 2014-12-21 ENCOUNTER — Ambulatory Visit: Payer: Medicare Other | Admitting: "Endocrinology

## 2014-12-24 ENCOUNTER — Ambulatory Visit: Payer: Medicare Other | Admitting: Nutrition

## 2014-12-24 ENCOUNTER — Ambulatory Visit: Payer: Medicare Other | Admitting: "Endocrinology

## 2014-12-24 DIAGNOSIS — E079 Disorder of thyroid, unspecified: Secondary | ICD-10-CM | POA: Diagnosis not present

## 2014-12-25 ENCOUNTER — Other Ambulatory Visit: Payer: Self-pay | Admitting: Family Medicine

## 2014-12-25 NOTE — Telephone Encounter (Signed)
Medication filled x1 with no refills.   Requires office visit before any further refills can be given.   Letter sent.  

## 2014-12-28 DIAGNOSIS — Z23 Encounter for immunization: Secondary | ICD-10-CM | POA: Diagnosis not present

## 2015-01-08 ENCOUNTER — Other Ambulatory Visit: Payer: Self-pay | Admitting: Family Medicine

## 2015-01-08 NOTE — Telephone Encounter (Signed)
Medication refilled per protocol. 

## 2015-01-12 ENCOUNTER — Ambulatory Visit: Payer: Medicare Other | Admitting: Family Medicine

## 2015-01-12 DIAGNOSIS — L609 Nail disorder, unspecified: Secondary | ICD-10-CM | POA: Diagnosis not present

## 2015-01-12 DIAGNOSIS — E1142 Type 2 diabetes mellitus with diabetic polyneuropathy: Secondary | ICD-10-CM | POA: Diagnosis not present

## 2015-01-15 ENCOUNTER — Ambulatory Visit (INDEPENDENT_AMBULATORY_CARE_PROVIDER_SITE_OTHER): Payer: Medicare Other | Admitting: "Endocrinology

## 2015-01-15 ENCOUNTER — Encounter: Payer: Self-pay | Admitting: "Endocrinology

## 2015-01-15 ENCOUNTER — Ambulatory Visit: Payer: Medicare Other | Admitting: Family Medicine

## 2015-01-15 ENCOUNTER — Ambulatory Visit: Payer: Medicare Other | Admitting: Nutrition

## 2015-01-15 VITALS — BP 118/76 | HR 68 | Ht 60.0 in | Wt 141.0 lb

## 2015-01-15 DIAGNOSIS — E079 Disorder of thyroid, unspecified: Secondary | ICD-10-CM | POA: Diagnosis not present

## 2015-01-16 NOTE — Progress Notes (Signed)
Subjective:    Patient ID: [name], female    DOB: [birth year],    Past Medical History  Diagnosis Date  . Hypertension   . Diabetes mellitus   . Glaucoma   . Stroke [facility])     2 TIA  . OSA (obstructive sleep apnea)   . Bipolar 1 disorder ([place])   . Thyroid disease 2010  . Schizophrenia ([place])   . Adenomatous colon polyp   . Urinary incontinence, mixed   . Diverticula of colon Colonoscopy 2013   Past Surgical History  Procedure Laterality Date  . Abdominal hysterectomy    . 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 Multiple tooth extractions    . Colonoscopy  2007    pancolonic diverticulosis, tcs in 2012 due to h/o adenomatous polyps  . Colonoscopy  11/15/2011    Procedure: COLONOSCOPY;  Surgeon: Daneil Dolin, MD;  Location: [facility];  Service: Endoscopy;  Laterality: N/A;  7:30   Social History   Social History  . Marital Status: Divorced    Spouse Name: N/A  . Number of Children: N/A  . Years of Education: N/A   Social History Main Topics  . Smoking status: Never Smoker   . Smokeless tobacco: Never Used  . Alcohol Use: No  . Drug Use: No  . Sexual Activity: Not Asked   Other Topics Concern  . None   Social History Narrative   Outpatient Encounter Prescriptions as of 01/15/2015  Medication Sig  . amLODipine (NORVASC) 10 MG tablet TAKE 1 TABLET BY MOUTH ONCE DAILY.  Marland Kitchen aspirin EC 325 MG tablet TAKE 1 TABLET BY MOUTH ONCE DAILY.  Marland Kitchen CALCIUM 600+D 600-200 MG-UNIT tablet TAKE 1 TABLET BY MOUTH TWICE DAILY.  Marland Kitchen CALTRATE GUMMY BITES 250-400 MG-UNIT CHEW CHEW 2 GUMMY BITES BY MOUTH TWICE DAILY.  . carvedilol (COREG) 6.25 MG tablet TAKE 1 TABLET BY MOUTH TWICE DAILY WITH A MEAL.  . cloNIDine (CATAPRES) 0.1 MG tablet TAKE 1 TABLET BY MOUTH TWICE DAILY.  Marland Kitchen clopidogrel (PLAVIX) 75 MG tablet TAKE 1 TABLET BY MOUTH ONCE DAILY.  Marland Kitchen FANAPT 4 MG TABS tablet TAKE 1 TABLET BY MOUTH TWICE DAILY.  . furosemide (LASIX) 20 MG tablet TAKE 1 TABLET BY MOUTH ONCE DAILY.  Marland Kitchen  Glucose Blood (BLOOD GLUCOSE TEST STRIPS) STRP Use to monitor FSBS 1x daily. Dx: E11.9.  . latanoprost (XALATAN) 0.005 % ophthalmic solution INSTILL 1 DROP INTO RIGHT EYE AT BEDTIME.  Marland Kitchen lisinopril (PRINIVIL,ZESTRIL) 40 MG tablet TAKE 1 TABLET BY MOUTH ONCE DAILY.  Marland Kitchen loratadine (CLARITIN) 10 MG tablet Take 1 tablet (10 mg total) by mouth daily as needed for allergies.  Marland Kitchen perphenazine (TRILAFON) 4 MG tablet TAKE 1 TABLET BY MOUTH TWICE DAILY.  . pravastatin (PRAVACHOL) 20 MG tablet TAKE 1 TABLET BY MOUTH EVERY EVENING.  Marland Kitchen Q-TUSSIN 100 MG/5ML syrup TAKE 10 ML BY MOUTH EVERY 8 HOURS AS NEEDED FOR COUGH.  . temazepam (RESTORIL) 15 MG capsule Take 1 capsule (15 mg total) by mouth at bedtime as needed. For sleep  . timolol (TIMOPTIC) 0.5 % ophthalmic solution INSTILL 1 DROP INTO RIGHT EYE EVERY MORNING.  Marland Kitchen topiramate (TOPAMAX) 50 MG tablet Take 50 mg by mouth 3 (three) times daily.    . traMADol (ULTRAM) 50 MG tablet Take 50 mg by mouth every 6 (six) hours as needed for pain.   No facility-administered encounter medications on file as of 01/15/2015.   ALLERGIES: Allergies  Allergen Reactions  . Codeine    VACCINATION STATUS: Immunization History  Administered Date(s) Administered  . Influenza Whole 11/24/2010  . Influenza,inj,Quad PF,36+ Mos 01/29/2013, 01/02/2014  . Influenza-Unspecified 12/19/2011, 12/25/2013  . Pneumococcal Conjugate-13 01/29/2013    HPI  69 yr old female with multiple medical problems as above. She is her to f/u for abnormal TFTs.  she is still a nursing home resident due to failure to thrive.  Patient is a poor historian. she has lost approximately 10 pounds over a period of 6 month period her her caretaker, she eats well no complaint of palpitations   her thyroid u/s is unremarkable, and repeat TFTs are stable. Her prior thyroid uptake was 13 % on 01/14/2014 with a dominant warm nodule on left lobe.   Review of Systems   Constitutional: no weight gain/loss, no  fatigue, no subjective hyperthermia/hypothermia Eyes: no blurry vision, no xerophthalmia ENT: no sore throat, no nodules palpated in throat, no dysphagia/odynophagia, no hoarseness Cardiovascular: no CP/SOB/palpitations/leg swelling Respiratory: no cough/SOB Gastrointestinal: no N/V/D/C Musculoskeletal: no muscle/joint aches Skin: no rashes Neurological: no tremors/numbness/tingling/dizziness Psychiatric: no depression/anxiety   Objective:    BP 118/76 mmHg  Pulse 68  Ht 5' (1.524 m)  Wt 141 lb (63.957 kg)  BMI 27.54 kg/m2  SpO2 99%  Wt Readings from Last 3 Encounters:  01/15/15 141 lb (63.957 kg)  06/19/14 148 lb (67.132 kg)  01/02/14 152 lb (68.947 kg)    Physical Exam  Constitutional: Stable state of mind, in NAD Eyes: PERRLA, EOMI, no exophthalmos ENT: moist mucous membranes, no thyromegaly, no cervical lymphadenopathy Cardiovascular: RRR, No MRG Respiratory: CTA B Gastrointestinal: abdomen soft, NT, ND, BS+ Musculoskeletal: no deformities, strength intact in all 4 Skin: moist, warm, no rashes Neurological: no tremor with outstretched hands, DTR normal in all 4  Results for orders placed or performed in visit on 29/47/65  Basic metabolic panel  Result Value Ref Range   Sodium 143 135 - 145 mEq/L   Potassium 3.7 3.5 - 5.3 mEq/L   Chloride 108 96 - 112 mEq/L   CO2 26 19 - 32 mEq/L   Glucose, Bld 105 (H) 70 - 99 mg/dL   BUN 18 6 - 23 mg/dL   Creat 1.16 (H) 0.50 - 1.10 mg/dL   Calcium 8.6 8.4 - 10.5 mg/dL  Hemoglobin A1c  Result Value Ref Range   Hgb A1c MFr Bld 6.1 (H) <5.7 %   Mean Plasma Glucose 128 (H) <117 mg/dL  Lipid panel  Result Value Ref Range   Cholesterol 125 0 - 200 mg/dL   Triglycerides 111 <150 mg/dL   HDL 38 (L) >=46 mg/dL   Total CHOL/HDL Ratio 3.3 Ratio   VLDL 22 0 - 40 mg/dL   LDL Cholesterol 65 0 - 99 mg/dL   Complete Blood Count (Most recent): Lab Results  Component Value Date   WBC 8.4 01/02/2014   HGB 12.4 01/02/2014   HCT 38.2  01/02/2014   MCV 80.9 01/02/2014   PLT 279 01/02/2014   Chemistry (most recent): Lab Results  Component Value Date   NA 143 06/19/2014   K 3.7 06/19/2014   CL 108 06/19/2014   CO2 26 06/19/2014   BUN 18 06/19/2014   CREATININE 1.16* 06/19/2014   Diabetic Labs (most recent): Lab Results  Component Value Date   HGBA1C 6.1* 06/19/2014   HGBA1C 6.0* 01/02/2014   HGBA1C 6.1* 10/01/2013   Lipid profile (most recent): Lab Results  Component Value Date   TRIG 111  06/19/2014   CHOL 125 06/19/2014         Assessment & Plan:   1. Disorder of thyroid  She came with stable TFTs. She would not need thyroid intervention at this point..  her prior u/s showed small nodules.  Her presentation is not specific but she has mildly toxic nodule. she will not need ablative therapy. She will have repeat labs in 6 months with office visit.   I advised patient to maintain close follow up with their PCP for primary care needs. Follow up plan: Return in about 6 months (around 07/15/2015) for abnormal thyroid test.  Glade Lloyd, MD Phone: [phone]  Fax: [phone]   01/16/2015, 8:55 AM

## 2015-01-19 ENCOUNTER — Encounter: Payer: Self-pay | Admitting: Family Medicine

## 2015-01-19 ENCOUNTER — Ambulatory Visit (INDEPENDENT_AMBULATORY_CARE_PROVIDER_SITE_OTHER): Payer: Medicare Other | Admitting: Family Medicine

## 2015-01-19 VITALS — BP 110/68 | HR 66 | Temp 97.9°F | Resp 12 | Ht 60.0 in | Wt 141.0 lb

## 2015-01-19 DIAGNOSIS — N390 Urinary tract infection, site not specified: Secondary | ICD-10-CM

## 2015-01-19 DIAGNOSIS — F319 Bipolar disorder, unspecified: Secondary | ICD-10-CM | POA: Insufficient documentation

## 2015-01-19 DIAGNOSIS — F3170 Bipolar disorder, currently in remission, most recent episode unspecified: Secondary | ICD-10-CM | POA: Diagnosis not present

## 2015-01-19 DIAGNOSIS — R8290 Unspecified abnormal findings in urine: Secondary | ICD-10-CM | POA: Diagnosis not present

## 2015-01-19 DIAGNOSIS — E119 Type 2 diabetes mellitus without complications: Secondary | ICD-10-CM

## 2015-01-19 DIAGNOSIS — N393 Stress incontinence (female) (male): Secondary | ICD-10-CM | POA: Diagnosis not present

## 2015-01-19 DIAGNOSIS — I10 Essential (primary) hypertension: Secondary | ICD-10-CM | POA: Diagnosis not present

## 2015-01-19 DIAGNOSIS — R8299 Other abnormal findings in urine: Secondary | ICD-10-CM | POA: Diagnosis not present

## 2015-01-19 DIAGNOSIS — F5104 Psychophysiologic insomnia: Secondary | ICD-10-CM

## 2015-01-19 DIAGNOSIS — G4700 Insomnia, unspecified: Secondary | ICD-10-CM

## 2015-01-19 DIAGNOSIS — F200 Paranoid schizophrenia: Secondary | ICD-10-CM

## 2015-01-19 LAB — URINALYSIS, MICROSCOPIC ONLY: Casts: NONE SEEN [LPF]

## 2015-01-19 LAB — URINALYSIS, ROUTINE W REFLEX MICROSCOPIC
Bilirubin Urine: NEGATIVE
Glucose, UA: NEGATIVE
KETONES UR: NEGATIVE
NITRITE: NEGATIVE
SPECIFIC GRAVITY, URINE: 1.015 (ref 1.001–1.035)
pH: >8 — ABNORMAL HIGH (ref 5.0–8.0)

## 2015-01-19 MED ORDER — TEMAZEPAM 22.5 MG PO CAPS: 22.5000 mg | ORAL_CAPSULE | Freq: Every evening | ORAL | Status: DC | PRN

## 2015-01-19 MED ORDER — CEPHALEXIN 500 MG PO CAPS: 500.0000 mg | ORAL_CAPSULE | Freq: Four times a day (QID) | ORAL | Status: DC

## 2015-01-19 NOTE — Patient Instructions (Signed)
Sleeping pill increased to 22.5mg  once a day Take antibiotics as prescribed We will call with lab results  F/U 4 months

## 2015-01-19 NOTE — Assessment & Plan Note (Signed)
Baseline incontinence she wears depends

## 2015-01-19 NOTE — Assessment & Plan Note (Signed)
Increase temazepam to 22.5 mg

## 2015-01-19 NOTE — Assessment & Plan Note (Signed)
Diet-controlled Will recheck A1c if this looks good and I will discontinue the daily glucose checks

## 2015-01-19 NOTE — Assessment & Plan Note (Signed)
She is still following with psychiatry there've been no medication changes her behavior has been stable and she has done quite well at the group home

## 2015-01-19 NOTE — Assessment & Plan Note (Signed)
Blood pressure well controlled no change in medication 

## 2015-01-19 NOTE — Progress Notes (Signed)
Patient ID: [name], female   DOB: 13-Mar-1946, 69 y.o.   MRN: [id number]    Subjective:    Patient ID: [name], female    DOB: [birth year], 69 y.o.   MRN: [id number]  Patient presents for F/U and Foul Urine  patient here to follow-up chronic medical problems. She is still being followed by psychiatry there been no recent medication changes. We do have a note from her facility stating that she has had a very foul odor to her urine, increased urinary incontinence. Denies dysuria They are requesting urinalysis.  Note she is still following up with podiatry as well she was last seen on November 1.  Following with Dr. Dorris Fetch for thyroid  DM- diet controlled- fasting blood sugars 96-104   States her sleep medicine is to longer helping. She is on temazepam 15 mg  She also needs an appointment with Dr. Clovia Cuff- eye doctor to recheck cataract  Review Of Systems:  GEN- denies fatigue, fever, weight loss,weakness, recent illness HEENT- denies eye drainage, change in vision, nasal discharge, CVS- denies chest pain, palpitations RESP- denies SOB, cough, wheeze ABD- denies N/V, change in stools, abd pain GU- denies dysuria, hematuria, dribbling,+ incontinence MSK- denies joint pain, muscle aches, injury Neuro- denies headache, dizziness, syncope, seizure activity       Objective:    BP 110/68 mmHg  Pulse 66  Temp(Src) 97.9 F (36.6 C) (Oral)  Resp 12  Ht 5' (1.524 m)  Wt 141 lb (63.957 kg)  BMI 27.54 kg/m2 GEN- NAD, alert and oriented x3, sitting in chair HEENT- PERRL, EOMI, non injected sclera, pink conjunctiva, MMM, oropharynx clear CVS- RRR, no murmur RESP-CTAB ABD-NABS,soft,NT,ND, no CVA tenderness  EXT- No edema Pulses- Radial, DP- 2+        Assessment & Plan:      Problem List Items Addressed This Visit    Schizophrenia ([place])   HTN (hypertension)   Diabetes mellitus ([place])   Relevant Orders   Hemoglobin A1c   Comprehensive metabolic panel   CBC with  Differential/Platelet   Bipolar disorder ([place])    Other Visit Diagnoses    UTI (lower urinary tract infection)    -  Primary    Start keflex, urine sent for culture    Relevant Medications    cephALEXin (KEFLEX) 500 MG capsule    Other Relevant Orders    Urinalysis, Routine w reflex microscopic (not at [facility]) (Completed)    Urine culture       Note: This dictation was prepared with Dragon dictation along with smaller phrase technology. Any transcriptional errors that result from this process are unintentional.

## 2015-01-20 ENCOUNTER — Encounter: Payer: Self-pay | Admitting: *Deleted

## 2015-01-20 LAB — CBC WITH DIFFERENTIAL/PLATELET
BASOS ABS: 0.1 10*3/uL (ref 0.0–0.1)
BASOS PCT: 1 % (ref 0–1)
EOS ABS: 0.2 10*3/uL (ref 0.0–0.7)
Eosinophils Relative: 3 % (ref 0–5)
HCT: 38 % (ref 36.0–46.0)
HEMOGLOBIN: 12.3 g/dL (ref 12.0–15.0)
Lymphocytes Relative: 37 % (ref 12–46)
Lymphs Abs: 2.4 10*3/uL (ref 0.7–4.0)
MCH: 26.8 pg (ref 26.0–34.0)
MCHC: 32.4 g/dL (ref 30.0–36.0)
MCV: 82.8 fL (ref 78.0–100.0)
MONOS PCT: 9 % (ref 3–12)
MPV: 11 fL (ref 8.6–12.4)
Monocytes Absolute: 0.6 10*3/uL (ref 0.1–1.0)
NEUTROS ABS: 3.2 10*3/uL (ref 1.7–7.7)
NEUTROS PCT: 50 % (ref 43–77)
PLATELETS: 239 10*3/uL (ref 150–400)
RBC: 4.59 MIL/uL (ref 3.87–5.11)
RDW: 14.8 % (ref 11.5–15.5)
WBC: 6.4 10*3/uL (ref 4.0–10.5)

## 2015-01-20 LAB — COMPREHENSIVE METABOLIC PANEL
ALBUMIN: 3.5 g/dL — AB (ref 3.6–5.1)
ALK PHOS: 72 U/L (ref 33–130)
ALT: 4 U/L — AB (ref 6–29)
AST: 10 U/L (ref 10–35)
BUN: 24 mg/dL (ref 7–25)
CALCIUM: 9.3 mg/dL (ref 8.6–10.4)
CO2: 24 mmol/L (ref 20–31)
Chloride: 106 mmol/L (ref 98–110)
Creat: 1.15 mg/dL — ABNORMAL HIGH (ref 0.60–0.93)
GLUCOSE: 92 mg/dL (ref 70–99)
POTASSIUM: 3.9 mmol/L (ref 3.5–5.3)
Sodium: 140 mmol/L (ref 135–146)
Total Bilirubin: 0.3 mg/dL (ref 0.2–1.2)
Total Protein: 6.1 g/dL (ref 6.1–8.1)

## 2015-01-20 LAB — HEMOGLOBIN A1C
HEMOGLOBIN A1C: 5.8 % — AB (ref <5.7)
Mean Plasma Glucose: 120 mg/dL — ABNORMAL HIGH (ref <117)

## 2015-01-21 ENCOUNTER — Telehealth: Payer: Self-pay | Admitting: *Deleted

## 2015-01-21 LAB — URINE CULTURE

## 2015-01-21 NOTE — Telephone Encounter (Signed)
Received call from [place].   Was advised that patient insurance does not cover Restoril 22.5mg .   MD please advise.

## 2015-01-22 NOTE — Telephone Encounter (Signed)
[facility] is closed for [facility]'s Day. Will call on Monday.

## 2015-01-22 NOTE — Telephone Encounter (Signed)
Okay, change to 30mg  of restoril then, this should be covered

## 2015-01-25 MED ORDER — TEMAZEPAM 30 MG PO CAPS: 30.0000 mg | ORAL_CAPSULE | Freq: Every evening | ORAL | Status: DC | PRN

## 2015-01-25 NOTE — Telephone Encounter (Signed)
Medication called to pharmacy.  Call placed to patient home and ALF made aware.   Order faxed.

## 2015-01-28 DIAGNOSIS — H2513 Age-related nuclear cataract, bilateral: Secondary | ICD-10-CM | POA: Diagnosis not present

## 2015-02-11 ENCOUNTER — Telehealth: Payer: Self-pay | Admitting: *Deleted

## 2015-02-11 NOTE — Telephone Encounter (Signed)
Received letter from patient insurance.   Advised that temazepam is high risk and will not be covered.  PA submitted.   Dx: [email or web address]

## 2015-02-12 NOTE — Telephone Encounter (Signed)
Received PA determination.   PA approved 03/11/2014- 03/12/2016.  Ref# [id number].  Approval faxed to [place]

## 2015-02-22 ENCOUNTER — Other Ambulatory Visit: Payer: Self-pay | Admitting: Family Medicine

## 2015-02-22 NOTE — Telephone Encounter (Signed)
Refill appropriate and filled per protocol. 

## 2015-02-25 DIAGNOSIS — F259 Schizoaffective disorder, unspecified: Secondary | ICD-10-CM | POA: Diagnosis not present

## 2015-05-18 ENCOUNTER — Encounter: Payer: Self-pay | Admitting: *Deleted

## 2015-05-19 ENCOUNTER — Ambulatory Visit: Payer: Medicare Other | Admitting: Family Medicine

## 2015-05-20 DIAGNOSIS — H2513 Age-related nuclear cataract, bilateral: Secondary | ICD-10-CM | POA: Diagnosis not present

## 2015-05-20 DIAGNOSIS — H538 Other visual disturbances: Secondary | ICD-10-CM | POA: Diagnosis not present

## 2015-05-20 LAB — HM DIABETES EYE EXAM

## 2015-05-21 ENCOUNTER — Encounter: Payer: Self-pay | Admitting: Family Medicine

## 2015-06-08 ENCOUNTER — Ambulatory Visit: Payer: Medicare Other | Admitting: Family Medicine

## 2015-06-10 DIAGNOSIS — F259 Schizoaffective disorder, unspecified: Secondary | ICD-10-CM | POA: Diagnosis not present

## 2015-06-11 ENCOUNTER — Encounter: Payer: Self-pay | Admitting: Family Medicine

## 2015-06-11 ENCOUNTER — Ambulatory Visit (INDEPENDENT_AMBULATORY_CARE_PROVIDER_SITE_OTHER): Payer: Medicare Other | Admitting: Family Medicine

## 2015-06-11 VITALS — BP 112/64 | HR 70 | Temp 98.4°F | Resp 16 | Ht 60.0 in | Wt 140.0 lb

## 2015-06-11 DIAGNOSIS — R2681 Unsteadiness on feet: Secondary | ICD-10-CM | POA: Diagnosis not present

## 2015-06-11 DIAGNOSIS — F200 Paranoid schizophrenia: Secondary | ICD-10-CM

## 2015-06-11 DIAGNOSIS — E119 Type 2 diabetes mellitus without complications: Secondary | ICD-10-CM | POA: Diagnosis not present

## 2015-06-11 DIAGNOSIS — I10 Essential (primary) hypertension: Secondary | ICD-10-CM | POA: Diagnosis not present

## 2015-06-11 DIAGNOSIS — G6289 Other specified polyneuropathies: Secondary | ICD-10-CM

## 2015-06-11 NOTE — Progress Notes (Signed)
Patient ID: [name], female   DOB: 1946/01/14, 70 y.o.   MRN: [id number]    Subjective:    Patient ID: [name], female    DOB: Jul 15, 1945, 70 y.o.   MRN: [id number]  Patient presents for Follow-up   Patient here for follow-up on chronic medical problems.  She Is supposed to be having cataract surgery in her right eye but there is some confusion if she needed preop clearance. I do not have any paperwork from the eye doctor.. Diabetes mellitus is diet controlled her last A1c was 5.8%. She is still taking statin drug as well as ACE inhibitor for blood pressure control.  She still follows with her psychiatrist for her schizophrenia and her bipolar which has been stable for many years. She continues to follow with endocrinology for hyperthyroidism  Review Of Systems:  GEN- denies fatigue, fever, weight loss,weakness, recent illness HEENT- denies eye drainage, change in vision, nasal discharge, CVS- denies chest pain, palpitations RESP- denies SOB, cough, wheeze ABD- denies N/V, change in stools, abd pain GU- denies dysuria, hematuria, dribbling, incontinence MSK- denies joint pain, muscle aches, injury Neuro- denies headache, dizziness, syncope, seizure activity       Objective:    BP 112/64 mmHg  Pulse 70  Temp(Src) 98.4 F (36.9 C) (Oral)  Resp 16  Ht 5' (1.524 m)  Wt 140 lb (63.504 kg)  BMI 27.34 kg/m2 GEN- NAD, alert and oriented x3 HEENT- rIGHT PUPIL REACTIVE, EOMI, non injected sclera, pink conjunctiva, MMM, oropharynx clear Neck- Supple, no thyromegaly CVS- RRR, no murmur RESP-CTAB EXT- No edema nEURO- SHORT Shuffling gait, unsteady  Pulses- Radial - 2+        Assessment & Plan:      Problem List Items Addressed This Visit    Schizophrenia ([place])    Continue to follow with psychiatry her medications are managed by them. She's been stable for quite some time she does not have any behavioral problems      Neuropathy, peripheral (HCC)   HTN  (hypertension) - Primary    Blood pressure well controlled She is medically cleared to have cataract surgery      Relevant Orders   CBC with Differential/Platelet   Comprehensive metabolic panel   Gait instability    She seems a little more unsteady with her gait the past couple of visits. She has not had any falls but I want to intervene before this happens. I will then have her set up with physical therapy so they can evaluate her she has a very short shuffling gait to see what type of walking device would be appropriate for her.      Relevant Orders   Ambulatory referral to Physical Therapy   Diabetes mellitus ([place])    Diet control off of medication she is doing quite well.      Relevant Orders   Lipid panel      Note: This dictation was prepared with Dragon dictation along with smaller phrase technology. Any transcriptional errors that result from this process are unintentional.

## 2015-06-11 NOTE — Patient Instructions (Addendum)
F/U 6 months for Physical We will call with lab results Stop glucose checks

## 2015-06-11 NOTE — Assessment & Plan Note (Signed)
Continue to follow with psychiatry her medications are managed by them. She's been stable for quite some time she does not have any behavioral problems

## 2015-06-11 NOTE — Assessment & Plan Note (Signed)
Diet control off of medication she is doing quite well.

## 2015-06-11 NOTE — Assessment & Plan Note (Signed)
She seems a little more unsteady with her gait the past couple of visits. She has not had any falls but I want to intervene before this happens. I will then have her set up with physical therapy so they can evaluate her she has a very short shuffling gait to see what type of walking device would be appropriate for her.

## 2015-06-11 NOTE — Assessment & Plan Note (Addendum)
Blood pressure well controlled She is medically cleared to have cataract surgery

## 2015-06-12 LAB — LIPID PANEL
CHOLESTEROL: 131 mg/dL (ref 125–200)
HDL: 46 mg/dL (ref >=46)
LDL Cholesterol: 66 mg/dL (ref <130)
Total CHOL/HDL Ratio: 2.8 Ratio (ref <=5.0)
Triglycerides: 95 mg/dL (ref <150)
VLDL: 19 mg/dL (ref <30)

## 2015-06-12 LAB — COMPREHENSIVE METABOLIC PANEL
ALK PHOS: 68 U/L (ref 33–130)
ALT: 5 U/L — AB (ref 6–29)
AST: 10 U/L (ref 10–35)
Albumin: 3.7 g/dL (ref 3.6–5.1)
BUN: 26 mg/dL — AB (ref 7–25)
CO2: 26 mmol/L (ref 20–31)
CREATININE: 1.12 mg/dL — AB (ref 0.60–0.93)
Calcium: 9.2 mg/dL (ref 8.6–10.4)
Chloride: 111 mmol/L — ABNORMAL HIGH (ref 98–110)
GLUCOSE: 102 mg/dL — AB (ref 70–99)
Potassium: 3.8 mmol/L (ref 3.5–5.3)
SODIUM: 145 mmol/L (ref 135–146)
TOTAL PROTEIN: 6.3 g/dL (ref 6.1–8.1)
Total Bilirubin: 0.3 mg/dL (ref 0.2–1.2)

## 2015-06-12 LAB — CBC WITH DIFFERENTIAL/PLATELET
BASOS ABS: 0 10*3/uL (ref 0.0–0.1)
BASOS PCT: 0 % (ref 0–1)
EOS ABS: 0.3 10*3/uL (ref 0.0–0.7)
Eosinophils Relative: 4 % (ref 0–5)
HCT: 38.3 % (ref 36.0–46.0)
Hemoglobin: 12.5 g/dL (ref 12.0–15.0)
Lymphocytes Relative: 37 % (ref 12–46)
Lymphs Abs: 2.4 10*3/uL (ref 0.7–4.0)
MCH: 27.8 pg (ref 26.0–34.0)
MCHC: 32.6 g/dL (ref 30.0–36.0)
MCV: 85.3 fL (ref 78.0–100.0)
MPV: 11.2 fL (ref 8.6–12.4)
Monocytes Absolute: 0.4 10*3/uL (ref 0.1–1.0)
Monocytes Relative: 7 % (ref 3–12)
NEUTROS PCT: 52 % (ref 43–77)
Neutro Abs: 3.3 10*3/uL (ref 1.7–7.7)
PLATELETS: 220 10*3/uL (ref 150–400)
RBC: 4.49 MIL/uL (ref 3.87–5.11)
RDW: 14.3 % (ref 11.5–15.5)
WBC: 6.4 10*3/uL (ref 4.0–10.5)

## 2015-06-15 ENCOUNTER — Encounter: Payer: Self-pay | Admitting: Family Medicine

## 2015-06-16 ENCOUNTER — Encounter: Payer: Self-pay | Admitting: *Deleted

## 2015-06-17 DIAGNOSIS — I10 Essential (primary) hypertension: Secondary | ICD-10-CM | POA: Diagnosis not present

## 2015-06-17 DIAGNOSIS — E119 Type 2 diabetes mellitus without complications: Secondary | ICD-10-CM | POA: Diagnosis not present

## 2015-06-17 DIAGNOSIS — R2681 Unsteadiness on feet: Secondary | ICD-10-CM | POA: Diagnosis not present

## 2015-06-17 DIAGNOSIS — Z9181 History of falling: Secondary | ICD-10-CM | POA: Diagnosis not present

## 2015-06-17 DIAGNOSIS — F200 Paranoid schizophrenia: Secondary | ICD-10-CM | POA: Diagnosis not present

## 2015-06-17 DIAGNOSIS — G6289 Other specified polyneuropathies: Secondary | ICD-10-CM | POA: Diagnosis not present

## 2015-06-17 DIAGNOSIS — F319 Bipolar disorder, unspecified: Secondary | ICD-10-CM | POA: Diagnosis not present

## 2015-06-17 DIAGNOSIS — Z7902 Long term (current) use of antithrombotics/antiplatelets: Secondary | ICD-10-CM | POA: Diagnosis not present

## 2015-06-17 DIAGNOSIS — R2689 Other abnormalities of gait and mobility: Secondary | ICD-10-CM | POA: Diagnosis not present

## 2015-06-22 DIAGNOSIS — E119 Type 2 diabetes mellitus without complications: Secondary | ICD-10-CM | POA: Diagnosis not present

## 2015-06-22 DIAGNOSIS — I10 Essential (primary) hypertension: Secondary | ICD-10-CM | POA: Diagnosis not present

## 2015-06-22 DIAGNOSIS — G6289 Other specified polyneuropathies: Secondary | ICD-10-CM | POA: Diagnosis not present

## 2015-06-22 DIAGNOSIS — R2681 Unsteadiness on feet: Secondary | ICD-10-CM | POA: Diagnosis not present

## 2015-06-22 DIAGNOSIS — F200 Paranoid schizophrenia: Secondary | ICD-10-CM | POA: Diagnosis not present

## 2015-06-22 DIAGNOSIS — R2689 Other abnormalities of gait and mobility: Secondary | ICD-10-CM | POA: Diagnosis not present

## 2015-06-24 DIAGNOSIS — G6289 Other specified polyneuropathies: Secondary | ICD-10-CM | POA: Diagnosis not present

## 2015-06-24 DIAGNOSIS — R2689 Other abnormalities of gait and mobility: Secondary | ICD-10-CM | POA: Diagnosis not present

## 2015-06-24 DIAGNOSIS — R2681 Unsteadiness on feet: Secondary | ICD-10-CM | POA: Diagnosis not present

## 2015-06-24 DIAGNOSIS — I10 Essential (primary) hypertension: Secondary | ICD-10-CM | POA: Diagnosis not present

## 2015-06-24 DIAGNOSIS — E119 Type 2 diabetes mellitus without complications: Secondary | ICD-10-CM | POA: Diagnosis not present

## 2015-06-24 DIAGNOSIS — F200 Paranoid schizophrenia: Secondary | ICD-10-CM | POA: Diagnosis not present

## 2015-06-28 DIAGNOSIS — E119 Type 2 diabetes mellitus without complications: Secondary | ICD-10-CM | POA: Diagnosis not present

## 2015-06-28 DIAGNOSIS — F200 Paranoid schizophrenia: Secondary | ICD-10-CM | POA: Diagnosis not present

## 2015-06-28 DIAGNOSIS — R2689 Other abnormalities of gait and mobility: Secondary | ICD-10-CM | POA: Diagnosis not present

## 2015-06-28 DIAGNOSIS — I10 Essential (primary) hypertension: Secondary | ICD-10-CM | POA: Diagnosis not present

## 2015-06-28 DIAGNOSIS — R2681 Unsteadiness on feet: Secondary | ICD-10-CM | POA: Diagnosis not present

## 2015-06-28 DIAGNOSIS — G6289 Other specified polyneuropathies: Secondary | ICD-10-CM | POA: Diagnosis not present

## 2015-06-30 DIAGNOSIS — F200 Paranoid schizophrenia: Secondary | ICD-10-CM | POA: Diagnosis not present

## 2015-06-30 DIAGNOSIS — R2689 Other abnormalities of gait and mobility: Secondary | ICD-10-CM | POA: Diagnosis not present

## 2015-06-30 DIAGNOSIS — I10 Essential (primary) hypertension: Secondary | ICD-10-CM | POA: Diagnosis not present

## 2015-06-30 DIAGNOSIS — G6289 Other specified polyneuropathies: Secondary | ICD-10-CM | POA: Diagnosis not present

## 2015-06-30 DIAGNOSIS — E119 Type 2 diabetes mellitus without complications: Secondary | ICD-10-CM | POA: Diagnosis not present

## 2015-06-30 DIAGNOSIS — R2681 Unsteadiness on feet: Secondary | ICD-10-CM | POA: Diagnosis not present

## 2015-07-05 DIAGNOSIS — R2681 Unsteadiness on feet: Secondary | ICD-10-CM | POA: Diagnosis not present

## 2015-07-05 DIAGNOSIS — I10 Essential (primary) hypertension: Secondary | ICD-10-CM | POA: Diagnosis not present

## 2015-07-05 DIAGNOSIS — F200 Paranoid schizophrenia: Secondary | ICD-10-CM | POA: Diagnosis not present

## 2015-07-05 DIAGNOSIS — G6289 Other specified polyneuropathies: Secondary | ICD-10-CM | POA: Diagnosis not present

## 2015-07-05 DIAGNOSIS — R2689 Other abnormalities of gait and mobility: Secondary | ICD-10-CM | POA: Diagnosis not present

## 2015-07-05 DIAGNOSIS — E119 Type 2 diabetes mellitus without complications: Secondary | ICD-10-CM | POA: Diagnosis not present

## 2015-07-06 ENCOUNTER — Encounter: Payer: Self-pay | Admitting: *Deleted

## 2015-07-06 ENCOUNTER — Telehealth: Payer: Self-pay | Admitting: Family Medicine

## 2015-07-06 NOTE — Telephone Encounter (Signed)
Letter faxed.

## 2015-07-06 NOTE — Telephone Encounter (Signed)
Patient daughter calling to ask question about plavix and having catarac surgery  Please call her at [phone] [place]

## 2015-07-06 NOTE — Telephone Encounter (Signed)
Okay to write letter, to D/C plavix /ASA for 7 days prior to surgery

## 2015-07-06 NOTE — Telephone Encounter (Signed)
Patient is scheduled for cataract surgery with Dr. Iona Hansen. Contact Marissa-  [phone].  States that patient will need to be off Plavix for 7- 10 days prior to surgery. Surgery has not been scheduled.   MD please advise.

## 2015-07-08 ENCOUNTER — Other Ambulatory Visit: Payer: Self-pay | Admitting: "Endocrinology

## 2015-07-08 DIAGNOSIS — E079 Disorder of thyroid, unspecified: Secondary | ICD-10-CM | POA: Diagnosis not present

## 2015-07-09 DIAGNOSIS — R2689 Other abnormalities of gait and mobility: Secondary | ICD-10-CM | POA: Diagnosis not present

## 2015-07-09 DIAGNOSIS — E119 Type 2 diabetes mellitus without complications: Secondary | ICD-10-CM | POA: Diagnosis not present

## 2015-07-09 DIAGNOSIS — F200 Paranoid schizophrenia: Secondary | ICD-10-CM | POA: Diagnosis not present

## 2015-07-09 DIAGNOSIS — R2681 Unsteadiness on feet: Secondary | ICD-10-CM | POA: Diagnosis not present

## 2015-07-09 DIAGNOSIS — I10 Essential (primary) hypertension: Secondary | ICD-10-CM | POA: Diagnosis not present

## 2015-07-09 DIAGNOSIS — G6289 Other specified polyneuropathies: Secondary | ICD-10-CM | POA: Diagnosis not present

## 2015-07-09 LAB — TSH: TSH: 0.39 m[IU]/L — AB

## 2015-07-09 LAB — T3, FREE: T3 FREE: 2.5 pg/mL (ref 2.3–4.2)

## 2015-07-09 LAB — T4, FREE: FREE T4: 1 ng/dL (ref 0.8–1.8)

## 2015-07-12 DIAGNOSIS — R2689 Other abnormalities of gait and mobility: Secondary | ICD-10-CM | POA: Diagnosis not present

## 2015-07-12 DIAGNOSIS — R2681 Unsteadiness on feet: Secondary | ICD-10-CM | POA: Diagnosis not present

## 2015-07-12 DIAGNOSIS — F200 Paranoid schizophrenia: Secondary | ICD-10-CM | POA: Diagnosis not present

## 2015-07-12 DIAGNOSIS — I10 Essential (primary) hypertension: Secondary | ICD-10-CM | POA: Diagnosis not present

## 2015-07-12 DIAGNOSIS — G6289 Other specified polyneuropathies: Secondary | ICD-10-CM | POA: Diagnosis not present

## 2015-07-12 DIAGNOSIS — E119 Type 2 diabetes mellitus without complications: Secondary | ICD-10-CM | POA: Diagnosis not present

## 2015-07-14 DIAGNOSIS — G6289 Other specified polyneuropathies: Secondary | ICD-10-CM | POA: Diagnosis not present

## 2015-07-14 DIAGNOSIS — R2681 Unsteadiness on feet: Secondary | ICD-10-CM | POA: Diagnosis not present

## 2015-07-14 DIAGNOSIS — E119 Type 2 diabetes mellitus without complications: Secondary | ICD-10-CM | POA: Diagnosis not present

## 2015-07-14 DIAGNOSIS — I10 Essential (primary) hypertension: Secondary | ICD-10-CM | POA: Diagnosis not present

## 2015-07-14 DIAGNOSIS — R2689 Other abnormalities of gait and mobility: Secondary | ICD-10-CM | POA: Diagnosis not present

## 2015-07-14 DIAGNOSIS — F200 Paranoid schizophrenia: Secondary | ICD-10-CM | POA: Diagnosis not present

## 2015-07-15 ENCOUNTER — Ambulatory Visit: Payer: Medicare Other | Admitting: "Endocrinology

## 2015-07-21 ENCOUNTER — Encounter: Payer: Self-pay | Admitting: "Endocrinology

## 2015-07-21 ENCOUNTER — Ambulatory Visit (INDEPENDENT_AMBULATORY_CARE_PROVIDER_SITE_OTHER): Payer: Medicare Other | Admitting: "Endocrinology

## 2015-07-21 VITALS — BP 111/73 | HR 65 | Ht 60.0 in | Wt 144.0 lb

## 2015-07-21 DIAGNOSIS — E0590 Thyrotoxicosis, unspecified without thyrotoxic crisis or storm: Secondary | ICD-10-CM | POA: Diagnosis not present

## 2015-07-21 NOTE — Progress Notes (Signed)
Subjective:    Patient ID: [name], female    DOB: [birth year],    Past Medical History  Diagnosis Date  . Hypertension   . Diabetes mellitus   . Glaucoma   . Stroke [facility])     2 TIA  . OSA (obstructive sleep apnea)   . Bipolar 1 disorder ([place])   . Thyroid disease 2010  . Schizophrenia ([place])   . Adenomatous colon polyp   . Urinary incontinence, mixed   . Diverticula of colon Colonoscopy 2013   Past Surgical History  Procedure Laterality Date  . Abdominal hysterectomy    . 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 Multiple tooth extractions    . Colonoscopy  2007    pancolonic diverticulosis, tcs in 2012 due to h/o adenomatous polyps  . Colonoscopy  11/15/2011    Procedure: COLONOSCOPY;  Surgeon: Daneil Dolin, MD;  Location: [facility];  Service: Endoscopy;  Laterality: N/A;  7:30   Social History   Social History  . Marital Status: Divorced    Spouse Name: N/A  . Number of Children: N/A  . Years of Education: N/A   Social History Main Topics  . Smoking status: Never Smoker   . Smokeless tobacco: Never Used  . Alcohol Use: No  . Drug Use: No  . Sexual Activity: Not Asked   Other Topics Concern  . None   Social History Narrative   Outpatient Encounter Prescriptions as of 07/21/2015  Medication Sig  . amLODipine (NORVASC) 10 MG tablet TAKE 1 TABLET BY MOUTH ONCE DAILY.  Marland Kitchen aspirin EC 325 MG tablet TAKE 1 TABLET BY MOUTH ONCE DAILY.  Marland Kitchen CALCIUM 600+D 600-200 MG-UNIT tablet TAKE 1 TABLET BY MOUTH TWICE DAILY.  . carvedilol (COREG) 6.25 MG tablet TAKE 1 TABLET BY MOUTH TWICE DAILY WITH A MEAL.  . cloNIDine (CATAPRES) 0.1 MG tablet TAKE 1 TABLET BY MOUTH TWICE DAILY.  Marland Kitchen clopidogrel (PLAVIX) 75 MG tablet TAKE 1 TABLET BY MOUTH ONCE DAILY.  Marland Kitchen FANAPT 4 MG TABS tablet TAKE 1 TABLET BY MOUTH TWICE DAILY.  . furosemide (LASIX) 20 MG tablet TAKE 1 TABLET BY MOUTH ONCE DAILY.  Marland Kitchen Glucose Blood (BLOOD GLUCOSE TEST STRIPS) STRP Use to monitor FSBS 1x daily. Dx: E11.9.   . latanoprost (XALATAN) 0.005 % ophthalmic solution INSTILL 1 DROP INTO RIGHT EYE AT BEDTIME.  Marland Kitchen lisinopril (PRINIVIL,ZESTRIL) 40 MG tablet TAKE 1 TABLET BY MOUTH ONCE DAILY.  Marland Kitchen loratadine (CLARITIN) 10 MG tablet Take 1 tablet (10 mg total) by mouth daily as needed for allergies.  Marland Kitchen perphenazine (TRILAFON) 4 MG tablet TAKE 1 TABLET BY MOUTH TWICE DAILY.  . pravastatin (PRAVACHOL) 20 MG tablet TAKE 1 TABLET BY MOUTH EVERY EVENING.  Marland Kitchen temazepam (RESTORIL) 30 MG capsule Take 1 capsule (30 mg total) by mouth at bedtime as needed for sleep.  Marland Kitchen timolol (TIMOPTIC) 0.5 % ophthalmic solution INSTILL 1 DROP INTO RIGHT EYE EVERY MORNING.  Marland Kitchen topiramate (TOPAMAX) 50 MG tablet Take 50 mg by mouth 2 (two) times daily.   . traMADol (ULTRAM) 50 MG tablet Take 50 mg by mouth every 6 (six) hours as needed for pain.   No facility-administered encounter medications on file as of 07/21/2015.   ALLERGIES: Allergies  Allergen Reactions  . Codeine    VACCINATION STATUS: Immunization History  Administered Date(s) Administered  . Influenza Whole 11/24/2010  . Influenza,inj,Quad PF,36+ Mos 01/29/2013, 01/02/2014  . Influenza-Unspecified 12/19/2011, 12/25/2013  .  Pneumococcal Conjugate-13 01/29/2013    HPI  70 yr old female with multiple medical problems as above. She is her to f/u for abnormal TFTs Consistent with subclinical hyperthyroidism.  she is still a nursing home resident due to failure to thrive.  Patient is a poor historian. she has  Steady weight since last visit in fact gained 4 pounds.  per  her caretaker, she eats well no complaint of palpitations   her thyroid u/s is unremarkable, and repeat TFTs are stable. Her prior thyroid uptake was 13 % on 01/14/2014 with a dominant warm nodule on left lobe.   Review of Systems   Constitutional:  she has steady weight,  no fatigue, no subjective hyperthermia/hypothermia Eyes: no blurry vision, no xerophthalmia ENT: no sore throat, no nodules  palpated in throat, no dysphagia/odynophagia, no hoarseness Cardiovascular: no CP/SOB/palpitations/leg swelling Respiratory: no cough/SOB Gastrointestinal: no N/V/D/C Musculoskeletal: no muscle/joint aches Skin: no rashes Neurological: no tremors/numbness/tingling/dizziness Psychiatric: no depression/anxiety   Objective:    BP 111/73 mmHg  Pulse 65  Ht 5' (1.524 m)  Wt 144 lb (65.318 kg)  BMI 28.12 kg/m2  SpO2 95%  Wt Readings from Last 3 Encounters:  07/21/15 144 lb (65.318 kg)  06/11/15 140 lb (63.504 kg)  01/19/15 141 lb (63.957 kg)    Physical Exam  Constitutional: Stable state of mind, in NAD Eyes: PERRLA, EOMI, no exophthalmos ENT: moist mucous membranes, no thyromegaly, no cervical lymphadenopathy Cardiovascular: RRR, No MRG Respiratory: CTA B Gastrointestinal: abdomen soft, NT, ND, BS+ Musculoskeletal: no deformities, strength intact in all 4 Skin: moist, warm, no rashes Neurological: no tremor with outstretched hands, DTR normal in all 4  Results for orders placed or performed in visit on 07/08/15  TSH  Result Value Ref Range   TSH 0.39 (L) mIU/L  T4, free  Result Value Ref Range   Free T4 1.0 0.8 - 1.8 ng/dL  T3, free  Result Value Ref Range   T3, Free 2.5 2.3 - 4.2 pg/mL   Complete Blood Count (Most recent): Lab Results  Component Value Date   WBC 6.4 06/11/2015   HGB 12.5 06/11/2015   HCT 38.3 06/11/2015   MCV 85.3 06/11/2015   PLT 220 06/11/2015   Chemistry (most recent): Lab Results  Component Value Date   NA 145 06/11/2015   K 3.8 06/11/2015   CL 111* 06/11/2015   CO2 26 06/11/2015   BUN 26* 06/11/2015   CREATININE 1.12* 06/11/2015   Diabetic Labs (most recent): Lab Results  Component Value Date   HGBA1C 5.8* 01/19/2015   HGBA1C 6.1* 06/19/2014   HGBA1C 6.0* 01/02/2014   Lipid profile (most recent): Lab Results  Component Value Date   TRIG 95 06/11/2015   CHOL 131 06/11/2015         Assessment & Plan:   1.Subclinical  hyperthyroidism:  She came with stable TFTs. She will not need thyroid intervention at this point.  her prior u/s showed small nodules.  Her presentation is not specific but she has mildly toxic nodule. she will not need ablative therapy. She will have repeat labs in 6 months with office visit.  I advised patient to maintain close follow up with their PCP for primary care needs. Follow up plan: Return in about 6 months (around 01/21/2016) for follow up with pre-visit labs.  Glade Lloyd, MD Phone: [phone]  Fax: [phone]   07/21/2015, 12:02 PM

## 2015-07-22 DIAGNOSIS — R2681 Unsteadiness on feet: Secondary | ICD-10-CM | POA: Diagnosis not present

## 2015-07-22 DIAGNOSIS — F200 Paranoid schizophrenia: Secondary | ICD-10-CM | POA: Diagnosis not present

## 2015-07-22 DIAGNOSIS — I10 Essential (primary) hypertension: Secondary | ICD-10-CM | POA: Diagnosis not present

## 2015-07-22 DIAGNOSIS — G6289 Other specified polyneuropathies: Secondary | ICD-10-CM | POA: Diagnosis not present

## 2015-07-22 DIAGNOSIS — R2689 Other abnormalities of gait and mobility: Secondary | ICD-10-CM | POA: Diagnosis not present

## 2015-07-22 DIAGNOSIS — E119 Type 2 diabetes mellitus without complications: Secondary | ICD-10-CM | POA: Diagnosis not present

## 2015-07-23 ENCOUNTER — Telehealth: Payer: Self-pay | Admitting: Family Medicine

## 2015-07-23 NOTE — Telephone Encounter (Signed)
Letter sent to [facility].

## 2015-07-23 NOTE — Telephone Encounter (Signed)
Patients daughter calling to ask questions regarding a med that she was supposed to stop taking prior to surgery, however she takes a pill packet and that is included, would like a call back regarding this He mom is in a home and needs this clarified so there will be no confusion with her medication  [phone]

## 2015-07-29 DIAGNOSIS — K2900 Acute gastritis without bleeding: Secondary | ICD-10-CM | POA: Diagnosis not present

## 2015-07-29 DIAGNOSIS — H538 Other visual disturbances: Secondary | ICD-10-CM | POA: Diagnosis not present

## 2015-07-29 DIAGNOSIS — R1032 Left lower quadrant pain: Secondary | ICD-10-CM | POA: Diagnosis not present

## 2015-07-29 DIAGNOSIS — H2511 Age-related nuclear cataract, right eye: Secondary | ICD-10-CM | POA: Diagnosis not present

## 2015-07-29 DIAGNOSIS — I10 Essential (primary) hypertension: Secondary | ICD-10-CM | POA: Diagnosis not present

## 2015-07-29 DIAGNOSIS — E119 Type 2 diabetes mellitus without complications: Secondary | ICD-10-CM | POA: Diagnosis not present

## 2015-08-02 DIAGNOSIS — H2511 Age-related nuclear cataract, right eye: Secondary | ICD-10-CM | POA: Diagnosis not present

## 2015-08-02 DIAGNOSIS — E1139 Type 2 diabetes mellitus with other diabetic ophthalmic complication: Secondary | ICD-10-CM | POA: Diagnosis not present

## 2015-08-02 DIAGNOSIS — I10 Essential (primary) hypertension: Secondary | ICD-10-CM | POA: Diagnosis not present

## 2015-08-02 DIAGNOSIS — H269 Unspecified cataract: Secondary | ICD-10-CM | POA: Diagnosis not present

## 2015-08-02 DIAGNOSIS — F319 Bipolar disorder, unspecified: Secondary | ICD-10-CM | POA: Diagnosis not present

## 2015-08-02 DIAGNOSIS — H524 Presbyopia: Secondary | ICD-10-CM | POA: Diagnosis not present

## 2015-08-02 DIAGNOSIS — E1136 Type 2 diabetes mellitus with diabetic cataract: Secondary | ICD-10-CM | POA: Diagnosis not present

## 2015-08-02 DIAGNOSIS — H52209 Unspecified astigmatism, unspecified eye: Secondary | ICD-10-CM | POA: Diagnosis not present

## 2015-08-02 DIAGNOSIS — N329 Bladder disorder, unspecified: Secondary | ICD-10-CM | POA: Diagnosis not present

## 2015-08-02 DIAGNOSIS — Z7982 Long term (current) use of aspirin: Secondary | ICD-10-CM | POA: Diagnosis not present

## 2015-08-02 DIAGNOSIS — E7800 Pure hypercholesterolemia, unspecified: Secondary | ICD-10-CM | POA: Diagnosis not present

## 2015-08-02 DIAGNOSIS — H25041 Posterior subcapsular polar age-related cataract, right eye: Secondary | ICD-10-CM | POA: Diagnosis not present

## 2015-08-02 DIAGNOSIS — Z79899 Other long term (current) drug therapy: Secondary | ICD-10-CM | POA: Diagnosis not present

## 2015-08-02 DIAGNOSIS — H4010X0 Unspecified open-angle glaucoma, stage unspecified: Secondary | ICD-10-CM | POA: Diagnosis not present

## 2015-08-02 DIAGNOSIS — H538 Other visual disturbances: Secondary | ICD-10-CM | POA: Diagnosis not present

## 2015-08-02 DIAGNOSIS — Z7902 Long term (current) use of antithrombotics/antiplatelets: Secondary | ICD-10-CM | POA: Diagnosis not present

## 2015-08-05 ENCOUNTER — Encounter: Payer: Self-pay | Admitting: Podiatry

## 2015-08-05 ENCOUNTER — Ambulatory Visit (INDEPENDENT_AMBULATORY_CARE_PROVIDER_SITE_OTHER): Payer: Medicare Other | Admitting: Podiatry

## 2015-08-05 VITALS — BP 138/79 | HR 82 | Resp 14

## 2015-08-05 DIAGNOSIS — M79676 Pain in unspecified toe(s): Secondary | ICD-10-CM | POA: Diagnosis not present

## 2015-08-05 DIAGNOSIS — B351 Tinea unguium: Secondary | ICD-10-CM | POA: Diagnosis not present

## 2015-08-05 NOTE — Progress Notes (Signed)
   Subjective:    Patient ID: [name], female    DOB: December 27, 1945, 70 y.o.   MRN: [id number]  HPI this patient presents to the office with chief complaint of long painful ingrowing toenails. She states the nails are painful walking and wearing her shoes. She denies any drainage coming from the nail sites. She presents the office today for preventative foot care services    Review of Systems  All other systems reviewed and are negative.      Objective:   Physical Exam GENERAL APPEARANCE: Alert, conversant. Appropriately groomed. No acute distress.  VASCULAR: Pedal pulses are  palpable at  [facility] and PT bilateral.  Capillary refill time is immediate to all digits,  Normal temperature gradient.  Digital hair growth is present bilateral  NEUROLOGIC: sensation is normal to 5.07 monofilament at 5/5 sites bilateral.  Light touch is intact bilateral, Muscle strength normal.  MUSCULOSKELETAL: acceptable muscle strength, tone and stability bilateral.  Intrinsic muscluature intact bilateral.  Rectus appearance of foot and digits noted bilateral.   DERMATOLOGIC: skin color, texture, and turgor are within normal limits.  No preulcerative lesions or ulcers  are seen, no interdigital maceration noted.  No open lesions present.  Digital nails are asymptomatic. No drainage noted.         Assessment & Plan:  Onychomycosis B/L  Diabetes with no complications.  Debridement and grinding of long nails. Gardiner Barefoot DPM

## 2015-08-05 NOTE — Addendum Note (Signed)
Addended by: Ezzard Flax, Heidemarie Goodnow L on: 08/05/2015 04:32 PM   Modules accepted: Medications

## 2015-11-04 ENCOUNTER — Ambulatory Visit (INDEPENDENT_AMBULATORY_CARE_PROVIDER_SITE_OTHER): Payer: Medicare Other | Admitting: Podiatry

## 2015-11-04 DIAGNOSIS — M79676 Pain in unspecified toe(s): Secondary | ICD-10-CM

## 2015-11-04 DIAGNOSIS — B351 Tinea unguium: Secondary | ICD-10-CM

## 2015-11-04 NOTE — Progress Notes (Signed)
   Subjective:    Patient ID: [name], female    DOB: 07-26-1945, 70 y.o.   MRN: [id number]  HPI this patient presents to the office with chief complaint of long painful ingrowing toenails. She states the nails are painful walking and wearing her shoes. She denies any drainage coming from the nail sites. She presents the office today for preventative foot care services    Review of Systems  All other systems reviewed and are negative.      Objective:   Physical Exam GENERAL APPEARANCE: Alert, conversant. Appropriately groomed. No acute distress.  VASCULAR: Pedal pulses are  palpable at  [facility] and PT bilateral.  Capillary refill time is immediate to all digits,  Normal temperature gradient.  Digital hair growth is present bilateral  NEUROLOGIC: sensation is normal to 5.07 monofilament at 5/5 sites bilateral.  Light touch is intact bilateral, Muscle strength normal.  MUSCULOSKELETAL: acceptable muscle strength, tone and stability bilateral.  Intrinsic muscluature intact bilateral.  Rectus appearance of foot and digits noted bilateral.   DERMATOLOGIC: skin color, texture, and turgor are within normal limits.  No preulcerative lesions or ulcers  are seen, no interdigital maceration noted.  No open lesions present.   No drainage noted.  NAILS  Thick disfigured mycotic pincer nails B/L         Assessment & Plan:  Onychomycosis B/L  Diabetes with no complications.  Debridement and grinding of long nails.  RTC 3 months. Gardiner Barefoot DPM

## 2015-12-06 DIAGNOSIS — F259 Schizoaffective disorder, unspecified: Secondary | ICD-10-CM | POA: Diagnosis not present

## 2015-12-09 ENCOUNTER — Ambulatory Visit (INDEPENDENT_AMBULATORY_CARE_PROVIDER_SITE_OTHER): Payer: Medicare Other | Admitting: Physician Assistant

## 2015-12-09 ENCOUNTER — Encounter: Payer: Self-pay | Admitting: Physician Assistant

## 2015-12-09 VITALS — BP 120/60 | HR 54 | Temp 97.7°F | Resp 14 | Wt 150.0 lb

## 2015-12-09 DIAGNOSIS — R319 Hematuria, unspecified: Secondary | ICD-10-CM

## 2015-12-09 DIAGNOSIS — R300 Dysuria: Secondary | ICD-10-CM

## 2015-12-09 DIAGNOSIS — N390 Urinary tract infection, site not specified: Secondary | ICD-10-CM | POA: Diagnosis not present

## 2015-12-09 LAB — URINALYSIS, ROUTINE W REFLEX MICROSCOPIC
Bilirubin Urine: NEGATIVE
GLUCOSE, UA: NEGATIVE
Ketones, ur: NEGATIVE
NITRITE: POSITIVE — AB
PH: 7.5 (ref 5.0–8.0)
SPECIFIC GRAVITY, URINE: 1.015 (ref 1.001–1.035)

## 2015-12-09 LAB — URINALYSIS, MICROSCOPIC ONLY
CRYSTALS: NONE SEEN [HPF]
YEAST: NONE SEEN [HPF]

## 2015-12-09 MED ORDER — CIPROFLOXACIN HCL 500 MG PO TABS: 500.0000 mg | ORAL_TABLET | Freq: Two times a day (BID) | ORAL | 0 refills | Status: DC

## 2015-12-09 NOTE — Progress Notes (Signed)
Patient ID: [name] MRN: [id number], DOB: 05-Jan-1946, 70 y.o. Date of Encounter: 12/09/2015, 1:24 PM    Chief Complaint:  Chief Complaint  Patient presents with  . Dysuria     HPI: 71 y.o. year old female lives in a home/facility. She is here with a lady who provides transportation to/from The facility.  This lady reports that personnel and the facility had noticed patient's urine being malodorous and that she was having to urinate frequently. Scheduled her visit here today to check for UTI. She has had no fever. She has not complained of any chills or new type of back pain. No other complaints or concerns today.     Home Meds:   Outpatient Medications Prior to Visit  Medication Sig Dispense Refill  . amLODipine (NORVASC) 10 MG tablet TAKE 1 TABLET BY MOUTH ONCE DAILY. 30 tablet 6  . aspirin EC 325 MG tablet TAKE 1 TABLET BY MOUTH ONCE DAILY. 30 tablet 6  . CALCIUM 600+D 600-200 MG-UNIT tablet TAKE 1 TABLET BY MOUTH TWICE DAILY. 60 tablet 6  . carvedilol (COREG) 6.25 MG tablet TAKE 1 TABLET BY MOUTH TWICE DAILY WITH A MEAL. 60 tablet 6  . cloNIDine (CATAPRES) 0.1 MG tablet TAKE 1 TABLET BY MOUTH TWICE DAILY. 60 tablet 6  . clopidogrel (PLAVIX) 75 MG tablet TAKE 1 TABLET BY MOUTH ONCE DAILY. 30 tablet 6  . FANAPT 4 MG TABS tablet TAKE 1 TABLET BY MOUTH TWICE DAILY. 60 tablet 0  . furosemide (LASIX) 20 MG tablet TAKE 1 TABLET BY MOUTH ONCE DAILY. 30 tablet 6  . Glucose Blood (BLOOD GLUCOSE TEST STRIPS) STRP Use to monitor FSBS 1x daily. Dx: E11.9. 50 each 11  . HYDROcodone-acetaminophen (NORCO/VICODIN) 5-325 MG tablet Take 1 tablet by mouth every 4 (four) hours as needed for moderate pain.    Marland Kitchen ketorolac (ACULAR) 0.5 % ophthalmic solution Place 1 drop into both eyes 4 (four) times daily.    Marland Kitchen latanoprost (XALATAN) 0.005 % ophthalmic solution INSTILL 1 DROP INTO RIGHT EYE AT BEDTIME. 2.5 mL 6  . lisinopril (PRINIVIL,ZESTRIL) 40 MG tablet TAKE 1 TABLET BY MOUTH ONCE  DAILY. 30 tablet 6  . loratadine (CLARITIN) 10 MG tablet Take 1 tablet (10 mg total) by mouth daily as needed for allergies. 30 tablet 6  . moxifloxacin (VIGAMOX) 0.5 % ophthalmic solution Place 1 drop into both eyes 4 (four) times daily.    Marland Kitchen perphenazine (TRILAFON) 4 MG tablet TAKE 1 TABLET BY MOUTH TWICE DAILY. 60 tablet 0  . pravastatin (PRAVACHOL) 20 MG tablet TAKE 1 TABLET BY MOUTH EVERY EVENING. 30 tablet 6  . prednisoLONE acetate (PRED FORTE) 1 % ophthalmic suspension 1 drop 4 (four) times daily.    . temazepam (RESTORIL) 30 MG capsule Take 1 capsule (30 mg total) by mouth at bedtime as needed for sleep. 30 capsule 3  . timolol (TIMOPTIC) 0.5 % ophthalmic solution INSTILL 1 DROP INTO RIGHT EYE EVERY MORNING. 10 mL 11  . topiramate (TOPAMAX) 50 MG tablet Take 50 mg by mouth 2 (two) times daily.     . traMADol (ULTRAM) 50 MG tablet Take 50 mg by mouth every 6 (six) hours as needed for pain.     No facility-administered medications prior to visit.     Allergies:  Allergies  Allergen Reactions  . Codeine       Review of Systems: See HPI for pertinent ROS. All other ROS negative.    Physical Exam: Blood pressure 120/60, pulse Marland Kitchen)  54, temperature 97.7 F (36.5 C), temperature source Oral, resp. rate 14, weight 150 lb (68 kg)., Body mass index is 29.29 kg/m. 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 Appears in no acute distress. Neck: Supple. No thyromegaly. No lymphadenopathy. Lungs: Clear bilaterally to auscultation without wheezes, rales, or rhonchi. Breathing is unlabored. Heart: Regular rhythm. No murmurs, rubs, or gallops. Abdomen: Soft, non-tender, non-distended with normoactive bowel sounds. No hepatomegaly. No rebound/guarding. No obvious abdominal masses. Msk:  No costophrenic angle tenderness with percussion bilaterally. Extremities/Skin: Warm and dry. Neuro: Alert and oriented X 3. Moves all extremities spontaneously. Gait is normal. CNII-XII grossly in tact. Psych:  Responds  to questions appropriately with a normal affect.   Results for orders placed or performed in visit on 12/09/15  Urinalysis, Routine w reflex microscopic (not at [facility])  Result Value Ref Range   Color, Urine YELLOW YELLOW   APPearance CLOUDY (A) CLEAR   Specific Gravity, Urine 1.015 1.001 - 1.035   pH 7.5 5.0 - 8.0   Glucose, UA NEGATIVE NEGATIVE   Bilirubin Urine NEGATIVE NEGATIVE   Ketones, ur NEGATIVE NEGATIVE   Hgb urine dipstick TRACE (A) NEGATIVE   Protein, ur 1+ (A) NEGATIVE   Nitrite POSITIVE (A) NEGATIVE   Leukocytes, UA 3+ (A) NEGATIVE  Urine Microscopic  Result Value Ref Range   WBC, UA 40-60 (A) <=5 WBC/HPF   RBC / HPF 10-20 (A) <=2 RBC/HPF   Squamous Epithelial / LPF 6-10 (A) <=5 HPF   Bacteria, UA MANY (A) NONE SEEN HPF   Crystals NONE SEEN NONE SEEN HPF   Casts See Below (A) NONE SEEN LPF   Yeast NONE SEEN NONE SEEN HPF     ASSESSMENT AND PLAN:  70 y.o. year old female with  1. Urinary tract infection with hematuria, site unspecified She is to take Cipro as directed. Start it immediately take as directed and complete all of it. Follow-up if symptoms do not resolve upon completion of antibiotic. As well, will send culture. Will f/u culture results.  - ciprofloxacin (CIPRO) 500 MG tablet; Take 1 tablet (500 mg total) by mouth 2 (two) times daily.  Dispense: 14 tablet; Refill: 0  2. Dysuria - Urinalysis, Routine w reflex microscopic (not at [facility])   Signed, [facility], [place], [facility] 12/09/2015 1:24 PM

## 2015-12-12 LAB — URINE CULTURE

## 2015-12-31 ENCOUNTER — Telehealth: Payer: Self-pay | Admitting: *Deleted

## 2015-12-31 NOTE — Telephone Encounter (Signed)
Noted  

## 2015-12-31 NOTE — Telephone Encounter (Signed)
Received call from patient daughter, [name] [phone]~ telephone.   States that patient was being seen by PT, but no walker has been ordered at this time. Reports that PT has also completed services.   Call placed to [facility]. Spoke with Lexine Baton, Buyer, retail. States that PT has completed services. Carma Lair that referral was trying to determine which walker would suit patient's shuffling gait. Per chart notes, PT recommended standard rolling walker. Of note, PT did document that patient improved with standard walker.   Call placed to facility. Was advised by med tech that patient is using a standard rolling walker. Call returned to patient daughter, [name]. [name] states that walker patient is using is not her walker, but another patient at the facility. Patient daughter is extremely upset that walker has not been ordered at this time.   Advised that to order a walker, DME will require chart notes <90 days from order. Advised to schedule appointment. [name] will call back to schedule appointment.

## 2016-01-12 ENCOUNTER — Other Ambulatory Visit: Payer: Self-pay | Admitting: "Endocrinology

## 2016-01-12 DIAGNOSIS — E0590 Thyrotoxicosis, unspecified without thyrotoxic crisis or storm: Secondary | ICD-10-CM | POA: Diagnosis not present

## 2016-01-12 LAB — TSH: TSH: 0.27 mIU/L — ABNORMAL LOW

## 2016-01-12 LAB — T4, FREE: FREE T4: 1.1 ng/dL (ref 0.8–1.8)

## 2016-01-12 LAB — T3, FREE: T3 FREE: 2.6 pg/mL (ref 2.3–4.2)

## 2016-01-21 ENCOUNTER — Ambulatory Visit (INDEPENDENT_AMBULATORY_CARE_PROVIDER_SITE_OTHER): Payer: Medicare Other | Admitting: "Endocrinology

## 2016-01-21 ENCOUNTER — Encounter: Payer: Self-pay | Admitting: "Endocrinology

## 2016-01-21 VITALS — BP 126/72 | HR 70 | Wt 155.0 lb

## 2016-01-21 DIAGNOSIS — E0590 Thyrotoxicosis, unspecified without thyrotoxic crisis or storm: Secondary | ICD-10-CM

## 2016-01-21 NOTE — Progress Notes (Signed)
Subjective:    Patient ID: [name], female    DOB: 02-08-1946,    Past Medical History:  Diagnosis Date  . Adenomatous colon polyp   . Bipolar 1 disorder ([place])   . Diabetes mellitus   . Diverticula of colon Colonoscopy 2013  . Glaucoma   . Hypertension   . OSA (obstructive sleep apnea)   . Schizophrenia ([place])   . Stroke [facility])    2 TIA  . Thyroid disease 2010  . Urinary incontinence, mixed    Past Surgical History:  Procedure Laterality Date  . ABDOMINAL HYSTERECTOMY    . COLONOSCOPY  2007   pancolonic diverticulosis, tcs in 2012 due to h/o adenomatous polyps  . COLONOSCOPY  11/15/2011   Procedure: COLONOSCOPY;  Surgeon: Daneil Dolin, MD;  [facility];  Service: Endoscopy;  Laterality: N/A;  7:30  . 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 MULTIPLE TOOTH EXTRACTIONS     Social History   Social History  . Marital status: Divorced    Spouse name: N/A  . Number of children: N/A  . Years of education: N/A   Social History Main Topics  . Smoking status: Never Smoker  . Smokeless tobacco: Never Used  . Alcohol use No  . Drug use: No  . Sexual activity: Not on file   Other Topics Concern  . Not on file   Social History Narrative  . No narrative on file   Outpatient Encounter Prescriptions as of 01/21/2016  Medication Sig  . amLODipine (NORVASC) 10 MG tablet TAKE 1 TABLET BY MOUTH ONCE DAILY.  Marland Kitchen aspirin EC 325 MG tablet TAKE 1 TABLET BY MOUTH ONCE DAILY.  Marland Kitchen CALCIUM 600+D 600-200 MG-UNIT tablet TAKE 1 TABLET BY MOUTH TWICE DAILY.  . carvedilol (COREG) 6.25 MG tablet TAKE 1 TABLET BY MOUTH TWICE DAILY WITH A MEAL.  . ciprofloxacin (CIPRO) 500 MG tablet Take 1 tablet (500 mg total) by mouth 2 (two) times daily.  . cloNIDine (CATAPRES) 0.1 MG tablet TAKE 1 TABLET BY MOUTH TWICE DAILY.  Marland Kitchen clopidogrel (PLAVIX) 75 MG tablet TAKE 1 TABLET BY MOUTH ONCE DAILY.  Marland Kitchen FANAPT 4 MG TABS tablet TAKE 1 TABLET BY MOUTH TWICE DAILY.  . furosemide (LASIX) 20 MG  tablet TAKE 1 TABLET BY MOUTH ONCE DAILY.  Marland Kitchen Glucose Blood (BLOOD GLUCOSE TEST STRIPS) STRP Use to monitor FSBS 1x daily. Dx: E11.9.  . HYDROcodone-acetaminophen (NORCO/VICODIN) 5-325 MG tablet Take 1 tablet by mouth every 4 (four) hours as needed for moderate pain.  Marland Kitchen ketorolac (ACULAR) 0.5 % ophthalmic solution Place 1 drop into both eyes 4 (four) times daily.  Marland Kitchen latanoprost (XALATAN) 0.005 % ophthalmic solution INSTILL 1 DROP INTO RIGHT EYE AT BEDTIME.  Marland Kitchen lisinopril (PRINIVIL,ZESTRIL) 40 MG tablet TAKE 1 TABLET BY MOUTH ONCE DAILY.  Marland Kitchen loratadine (CLARITIN) 10 MG tablet Take 1 tablet (10 mg total) by mouth daily as needed for allergies.  Marland Kitchen moxifloxacin (VIGAMOX) 0.5 % ophthalmic solution Place 1 drop into both eyes 4 (four) times daily.  Marland Kitchen perphenazine (TRILAFON) 4 MG tablet TAKE 1 TABLET BY MOUTH TWICE DAILY.  . pravastatin (PRAVACHOL) 20 MG tablet TAKE 1 TABLET BY MOUTH EVERY EVENING.  . prednisoLONE acetate (PRED FORTE) 1 % ophthalmic suspension 1 drop 4 (four) times daily.  . temazepam (RESTORIL) 30 MG capsule Take 1 capsule (30 mg total) by mouth at bedtime as needed for sleep.  Marland Kitchen timolol (TIMOPTIC) 0.5 % ophthalmic solution  INSTILL 1 DROP INTO RIGHT EYE EVERY MORNING.  Marland Kitchen topiramate (TOPAMAX) 50 MG tablet Take 50 mg by mouth 2 (two) times daily.   . traMADol (ULTRAM) 50 MG tablet Take 50 mg by mouth every 6 (six) hours as needed for pain.   No facility-administered encounter medications on file as of 01/21/2016.    ALLERGIES: Allergies  Allergen Reactions  . Codeine    VACCINATION STATUS: Immunization History  Administered Date(s) Administered  . Influenza Whole 11/24/2010  . Influenza,inj,Quad PF,36+ Mos 01/29/2013, 01/02/2014  . Influenza-Unspecified 12/19/2011, 12/25/2013  . Pneumococcal Conjugate-13 01/29/2013    HPI  70 yr old female with multiple medical problems as above. She is here to f/u for abnormal TFTs Consistent with subclinical hyperthyroidism.  she is still  a nursing home resident due to failure to thrive.  Patient is a poor historian. she has Gained approximately 11 pounds overall over the last 6 months. Per  her caretaker, she eats well , no complaint of palpitations   her thyroid u/s is unremarkable, and repeat TFTs are stable. Her prior thyroid uptake was 13 % on 01/14/2014 with a dominant warm nodule on left lobe.   Review of Systems   Constitutional:  she has steady weight gain,  no fatigue, no subjective hyperthermia/hypothermia Eyes: no blurry vision, no xerophthalmia ENT: no sore throat, no nodules palpated in throat, no dysphagia/odynophagia, no hoarseness Cardiovascular: no CP/SOB/palpitations/leg swelling Respiratory: no cough/SOB Gastrointestinal: no N/V/D/C Musculoskeletal: no muscle/joint aches Skin: no rashes Neurological: no tremors/numbness/tingling/dizziness Psychiatric: no depression/anxiety   Objective:    BP 126/72   Pulse 70   Wt 155 lb (70.3 kg)   BMI 30.27 kg/m   Wt Readings from Last 3 Encounters:  01/21/16 155 lb (70.3 kg)  12/09/15 150 lb (68 kg)  07/21/15 144 lb (65.3 kg)    Physical Exam  Constitutional: Stable state of mind, in NAD Eyes: PERRLA, EOMI, no exophthalmos ENT: moist mucous membranes, no thyromegaly, no cervical lymphadenopathy Cardiovascular: RRR, No MRG Respiratory: CTA B Gastrointestinal: abdomen soft, NT, ND, BS+ Musculoskeletal: no deformities, strength intact in all 4 Skin: moist, warm, no rashes Neurological: no tremor with outstretched hands, DTR normal in all 4  Results for orders placed or performed in visit on 01/12/16  TSH  Result Value Ref Range   TSH 0.27 (L) mIU/L  T4, free  Result Value Ref Range   Free T4 1.1 0.8 - 1.8 ng/dL  T3, free  Result Value Ref Range   T3, Free 2.6 2.3 - 4.2 pg/mL   Complete Blood Count (Most recent): Lab Results  Component Value Date   WBC 6.4 06/11/2015   HGB 12.5 06/11/2015   HCT 38.3 06/11/2015   MCV 85.3 06/11/2015    PLT 220 06/11/2015   Chemistry (most recent): Lab Results  Component Value Date   NA 145 06/11/2015   K 3.8 06/11/2015   CL 111 (H) 06/11/2015   CO2 26 06/11/2015   BUN 26 (H) 06/11/2015   CREATININE 1.12 (H) 06/11/2015   Diabetic Labs (most recent): Lab Results  Component Value Date   HGBA1C 5.8 (H) 01/19/2015   HGBA1C 6.1 (H) 06/19/2014   HGBA1C 6.0 (H) 01/02/2014   Lipid profile (most recent): Lab Results  Component Value Date   TRIG 95 06/11/2015   CHOL 131 06/11/2015         Assessment & Plan:   1.Subclinical hyperthyroidism:  She came with stable TFTs. She will not need thyroid intervention at this time.  her prior u/s showed small nodules.  Her presentation is not specific but she has mildly toxic nodule. she will not need ablative therapy. She will have repeat labs in 6 months with office visit.  I advised patient to maintain close follow up with their PCP for primary care needs. Follow up plan: Return in about 6 months (around 07/20/2016) for follow up with pre-visit labs.  Glade Lloyd, MD Phone: [phone]  Fax: [phone]   01/21/2016, 11:15 AM

## 2016-01-27 ENCOUNTER — Ambulatory Visit: Payer: Medicare Other | Admitting: Podiatry

## 2016-01-27 DIAGNOSIS — E1151 Type 2 diabetes mellitus with diabetic peripheral angiopathy without gangrene: Secondary | ICD-10-CM | POA: Diagnosis not present

## 2016-01-27 DIAGNOSIS — B351 Tinea unguium: Secondary | ICD-10-CM | POA: Diagnosis not present

## 2016-01-28 ENCOUNTER — Telehealth: Payer: Self-pay | Admitting: Family Medicine

## 2016-01-28 MED ORDER — TEMAZEPAM 30 MG PO CAPS: 30.0000 mg | ORAL_CAPSULE | Freq: Every evening | ORAL | 3 refills | Status: DC | PRN

## 2016-01-28 NOTE — Telephone Encounter (Signed)
Fax from [facility] requesting refill on Tramadol and Temazepam - Ok to refill??      Looks like Tramadol was last filled 04/11/11 and that was the only time she has gotten it filled through [place]. Temazepam LRF 01/25/15 and LOV 12/09/15.

## 2016-01-28 NOTE — Telephone Encounter (Signed)
Call placed to facility.   Reports that facility standards recommend that prescription is either filled or D/C'ed.   Patient is not requesting pain relief. Requested order to D/C. Order faxed to facility.   Medication called to pharmacy.

## 2016-01-28 NOTE — Telephone Encounter (Signed)
Okay to refill the temazepam See why Ultram needed, she has not filled in a few years

## 2016-02-28 DIAGNOSIS — F259 Schizoaffective disorder, unspecified: Secondary | ICD-10-CM | POA: Diagnosis not present

## 2016-04-18 ENCOUNTER — Ambulatory Visit (INDEPENDENT_AMBULATORY_CARE_PROVIDER_SITE_OTHER): Payer: Medicare Other | Admitting: Family Medicine

## 2016-04-18 ENCOUNTER — Encounter: Payer: Self-pay | Admitting: Family Medicine

## 2016-04-18 VITALS — BP 108/62 | HR 72 | Temp 98.0°F | Resp 16 | Wt 151.6 lb

## 2016-04-18 DIAGNOSIS — Z23 Encounter for immunization: Secondary | ICD-10-CM

## 2016-04-18 DIAGNOSIS — E7800 Pure hypercholesterolemia, unspecified: Secondary | ICD-10-CM | POA: Diagnosis not present

## 2016-04-18 DIAGNOSIS — R2681 Unsteadiness on feet: Secondary | ICD-10-CM | POA: Diagnosis not present

## 2016-04-18 DIAGNOSIS — E119 Type 2 diabetes mellitus without complications: Secondary | ICD-10-CM | POA: Diagnosis not present

## 2016-04-18 DIAGNOSIS — F3170 Bipolar disorder, currently in remission, most recent episode unspecified: Secondary | ICD-10-CM | POA: Diagnosis not present

## 2016-04-18 DIAGNOSIS — I10 Essential (primary) hypertension: Secondary | ICD-10-CM | POA: Diagnosis not present

## 2016-04-18 MED ORDER — CLONIDINE HCL 0.1 MG PO TABS: 0.1000 mg | ORAL_TABLET | Freq: Every evening | ORAL | 6 refills | Status: DC

## 2016-04-18 NOTE — Assessment & Plan Note (Signed)
Continues to be followed by psychiatry mood has been stable

## 2016-04-18 NOTE — Patient Instructions (Signed)
F/U 4 months  

## 2016-04-18 NOTE — Assessment & Plan Note (Signed)
Diabetes has been diet controlled. Her weight has been steady over the past year. We'll recheck her A1c She is on aspirin as well as ACE inhibitor  Flu shot given

## 2016-04-18 NOTE — Assessment & Plan Note (Signed)
Blood pressures on the lower normotensive side. She is not symptomatic with her age I wouldn't decrease her clonidine down to 0.1 mg at bedtime only she will continue the other medications

## 2016-04-18 NOTE — Assessment & Plan Note (Signed)
Recheck lipids on statin 

## 2016-04-18 NOTE — Progress Notes (Signed)
   Subjective:    Patient ID: [name], female    DOB: 1945-07-31, 71 y.o.   MRN: [id number]  Patient presents for f/u visit   Pt here to f/u chronic medical problems and for medication review.  Still followed by endocrinology for her hyperthyroidism  DM- last A1C 5.8% diet controlled 1 year ago   She does request a rolling walker. States that after she had physical therapy last year she was given a regular walker which is on  Loan from the facility and needs one of her own. She's not had any falls\  She has not had her flu shot yet she would like to get this done. She still followed by psychiatry at [facility] there's been no medication changes Followed by podiatry for nail trimming Review Of Systems:  GEN- denies fatigue, fever, weight loss,weakness, recent illness HEENT- denies eye drainage, change in vision, nasal discharge, CVS- denies chest pain, palpitations RESP- denies SOB, cough, wheeze ABD- denies N/V, change in stools, abd pain GU- denies dysuria, hematuria, dribbling, incontinence MSK- denies joint pain, muscle aches, injury Neuro- denies headache, dizziness, syncope, seizure activity       Objective:    BP 108/62   Pulse 72   Temp 98 F (36.7 C) (Oral)   Resp 16   Wt 151 lb 9.6 oz (68.8 kg)   SpO2 96%   BMI 29.61 kg/m  GEN- NAD, alert and oriented x3 HEENT- Right PUPIL REACTIVE, EOMI, non injected sclera, pink conjunctiva, MMM, oropharynx clear Neck- Supple, no thyromegaly CVS- RRR, no murmur RESP-CTAB ABD-NABS,soft,NT,ND  EXT- trace ankle edema Neuro- short Shuffling gait, unsteady -using walker improves balance  Pulses- Radial - 2+      Assessment & Plan:      Problem List Items Addressed This Visit    Hyperlipidemia    Recheck lipids on statin      Relevant Medications   cloNIDine (CATAPRES) 0.1 MG tablet   Other Relevant Orders   Lipid panel   HTN (hypertension)    Blood pressures on the lower normotensive side. She is not  symptomatic with her age I wouldn't decrease her clonidine down to 0.1 mg at bedtime only she will continue the other medications      Relevant Medications   cloNIDine (CATAPRES) 0.1 MG tablet   Other Relevant Orders   CBC with Differential/Platelet   Comprehensive metabolic panel   Gait instability    We'll prescribe a new walker for her.      Diabetes mellitus ([place])    Diabetes has been diet controlled. Her weight has been steady over the past year. We'll recheck her A1c She is on aspirin as well as ACE inhibitor  Flu shot given      Relevant Orders   Hemoglobin A1c   Bipolar disorder ([place])    Continues to be followed by psychiatry mood has been stable       Other Visit Diagnoses    Need for prophylactic vaccination and inoculation against influenza    -  Primary   Relevant Orders   Flu Vaccine QUAD 36+ mos PF IM (Fluarix & Fluzone Quad PF) (Completed)      Note: This dictation was prepared with Dragon dictation along with smaller phrase technology. Any transcriptional errors that result from this process are unintentional.

## 2016-04-18 NOTE — Assessment & Plan Note (Signed)
We'll prescribe a new walker for her.

## 2016-04-20 DIAGNOSIS — I10 Essential (primary) hypertension: Secondary | ICD-10-CM | POA: Diagnosis not present

## 2016-04-20 DIAGNOSIS — E119 Type 2 diabetes mellitus without complications: Secondary | ICD-10-CM | POA: Diagnosis not present

## 2016-04-20 DIAGNOSIS — E7800 Pure hypercholesterolemia, unspecified: Secondary | ICD-10-CM | POA: Diagnosis not present

## 2016-04-20 LAB — CBC WITH DIFFERENTIAL/PLATELET
BASOS ABS: 0 {cells}/uL (ref 0–200)
Basophils Relative: 0 %
EOS ABS: 201 {cells}/uL (ref 15–500)
Eosinophils Relative: 3 %
HEMATOCRIT: 38.7 % (ref 35.0–45.0)
HEMOGLOBIN: 12.6 g/dL (ref 12.0–15.0)
LYMPHS ABS: 2144 {cells}/uL (ref 850–3900)
Lymphocytes Relative: 32 %
MCH: 27.4 pg (ref 27.0–33.0)
MCHC: 32.6 g/dL (ref 32.0–36.0)
MCV: 84.1 fL (ref 80.0–100.0)
MONO ABS: 469 {cells}/uL (ref 200–950)
MPV: 10.9 fL (ref 7.5–12.5)
Monocytes Relative: 7 %
NEUTROS ABS: 3886 {cells}/uL (ref 1500–7800)
Neutrophils Relative %: 58 %
Platelets: 224 10*3/uL (ref 140–400)
RBC: 4.6 MIL/uL (ref 3.80–5.10)
RDW: 14.5 % (ref 11.0–15.0)
WBC: 6.7 10*3/uL (ref 3.8–10.8)

## 2016-04-20 LAB — LIPID PANEL
Cholesterol: 136 mg/dL (ref <200)
HDL: 52 mg/dL (ref >50)
LDL CALC: 71 mg/dL (ref <100)
TRIGLYCERIDES: 64 mg/dL (ref <150)
Total CHOL/HDL Ratio: 2.6 Ratio (ref <5.0)
VLDL: 13 mg/dL (ref <30)

## 2016-04-20 LAB — COMPREHENSIVE METABOLIC PANEL
ALBUMIN: 3.7 g/dL (ref 3.6–5.1)
ALT: 5 U/L — ABNORMAL LOW (ref 6–29)
AST: 11 U/L (ref 10–35)
Alkaline Phosphatase: 78 U/L (ref 33–130)
BUN: 18 mg/dL (ref 7–25)
CHLORIDE: 111 mmol/L — AB (ref 98–110)
CO2: 26 mmol/L (ref 20–31)
Calcium: 8.9 mg/dL (ref 8.6–10.4)
Creat: 1.2 mg/dL — ABNORMAL HIGH (ref 0.60–0.93)
Glucose, Bld: 90 mg/dL (ref 70–99)
POTASSIUM: 4 mmol/L (ref 3.5–5.3)
SODIUM: 145 mmol/L (ref 135–146)
TOTAL PROTEIN: 6.3 g/dL (ref 6.1–8.1)
Total Bilirubin: 0.3 mg/dL (ref 0.2–1.2)

## 2016-04-21 LAB — HEMOGLOBIN A1C
Hgb A1c MFr Bld: 5.4 % (ref <5.7)
Mean Plasma Glucose: 108 mg/dL

## 2016-05-15 DIAGNOSIS — F259 Schizoaffective disorder, unspecified: Secondary | ICD-10-CM | POA: Diagnosis not present

## 2016-07-12 ENCOUNTER — Other Ambulatory Visit: Payer: Self-pay | Admitting: "Endocrinology

## 2016-07-12 DIAGNOSIS — E0590 Thyrotoxicosis, unspecified without thyrotoxic crisis or storm: Secondary | ICD-10-CM | POA: Diagnosis not present

## 2016-07-12 LAB — T4, FREE: Free T4: 1.1 ng/dL (ref 0.8–1.8)

## 2016-07-12 LAB — TSH: TSH: 0.11 mIU/L — ABNORMAL LOW

## 2016-07-21 ENCOUNTER — Ambulatory Visit (INDEPENDENT_AMBULATORY_CARE_PROVIDER_SITE_OTHER): Payer: Medicare Other | Admitting: "Endocrinology

## 2016-07-21 ENCOUNTER — Encounter: Payer: Self-pay | Admitting: "Endocrinology

## 2016-07-21 VITALS — BP 100/66 | HR 76 | Wt 148.0 lb

## 2016-07-21 DIAGNOSIS — E0590 Thyrotoxicosis, unspecified without thyrotoxic crisis or storm: Secondary | ICD-10-CM

## 2016-07-21 MED ORDER — METHIMAZOLE 5 MG PO TABS: 5.0000 mg | ORAL_TABLET | Freq: Every day | ORAL | 3 refills | Status: DC

## 2016-07-21 NOTE — Progress Notes (Signed)
Subjective:    Patient ID: [name], female    DOB: 1945-03-14,    Past Medical History:  Diagnosis Date  . Adenomatous colon polyp   . Bipolar 1 disorder ([place])   . Diabetes mellitus   . Diverticula of colon Colonoscopy 2013  . Glaucoma   . Hypertension   . OSA (obstructive sleep apnea)   . Schizophrenia ([place])   . Stroke [facility])    2 TIA  . Thyroid disease 2010  . Urinary incontinence, mixed    Past Surgical History:  Procedure Laterality Date  . ABDOMINAL HYSTERECTOMY    . COLONOSCOPY  2007   pancolonic diverticulosis, tcs in 2012 due to h/o adenomatous polyps  . COLONOSCOPY  11/15/2011   Procedure: COLONOSCOPY;  Surgeon: Daneil Dolin, MD;  [facility];  Service: Endoscopy;  Laterality: N/A;  7:30  . 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 MULTIPLE TOOTH EXTRACTIONS     Social History   Social History  . Marital status: Divorced    Spouse name: N/A  . Number of children: N/A  . Years of education: N/A   Social History Main Topics  . Smoking status: Never Smoker  . Smokeless tobacco: Never Used  . Alcohol use No  . Drug use: No  . Sexual activity: Not on file   Other Topics Concern  . Not on file   Social History Narrative  . No narrative on file   Outpatient Encounter Prescriptions as of 07/21/2016  Medication Sig  . amLODipine (NORVASC) 10 MG tablet TAKE 1 TABLET BY MOUTH ONCE DAILY.  Marland Kitchen aspirin EC 325 MG tablet TAKE 1 TABLET BY MOUTH ONCE DAILY.  Marland Kitchen CALCIUM 600+D 600-200 MG-UNIT tablet TAKE 1 TABLET BY MOUTH TWICE DAILY.  . carvedilol (COREG) 6.25 MG tablet TAKE 1 TABLET BY MOUTH TWICE DAILY WITH A MEAL.  . ciprofloxacin (CIPRO) 500 MG tablet Take 1 tablet (500 mg total) by mouth 2 (two) times daily.  . cloNIDine (CATAPRES) 0.1 MG tablet Take 1 tablet (0.1 mg total) by mouth every evening.  . clopidogrel (PLAVIX) 75 MG tablet TAKE 1 TABLET BY MOUTH ONCE DAILY.  Marland Kitchen FANAPT 4 MG TABS tablet TAKE 1 TABLET BY MOUTH TWICE DAILY.  . furosemide  (LASIX) 20 MG tablet TAKE 1 TABLET BY MOUTH ONCE DAILY.  Marland Kitchen Glucose Blood (BLOOD GLUCOSE TEST STRIPS) STRP Use to monitor FSBS 1x daily. Dx: E11.9.  . HYDROcodone-acetaminophen (NORCO/VICODIN) 5-325 MG tablet Take 1 tablet by mouth every 4 (four) hours as needed for moderate pain.  Marland Kitchen ketorolac (ACULAR) 0.5 % ophthalmic solution Place 1 drop into both eyes 4 (four) times daily.  Marland Kitchen latanoprost (XALATAN) 0.005 % ophthalmic solution INSTILL 1 DROP INTO RIGHT EYE AT BEDTIME.  Marland Kitchen lisinopril (PRINIVIL,ZESTRIL) 40 MG tablet TAKE 1 TABLET BY MOUTH ONCE DAILY.  Marland Kitchen loratadine (CLARITIN) 10 MG tablet Take 1 tablet (10 mg total) by mouth daily as needed for allergies.  . methimazole (TAPAZOLE) 5 MG tablet Take 1 tablet (5 mg total) by mouth daily.  Marland Kitchen moxifloxacin (VIGAMOX) 0.5 % ophthalmic solution Place 1 drop into both eyes 4 (four) times daily.  Marland Kitchen perphenazine (TRILAFON) 4 MG tablet TAKE 1 TABLET BY MOUTH TWICE DAILY.  . pravastatin (PRAVACHOL) 20 MG tablet TAKE 1 TABLET BY MOUTH EVERY EVENING.  . prednisoLONE acetate (PRED FORTE) 1 % ophthalmic suspension 1 drop 4 (four) times daily.  . temazepam (RESTORIL) 30 MG capsule Take 1 capsule (  30 mg total) by mouth at bedtime as needed for sleep.  Marland Kitchen timolol (TIMOPTIC) 0.5 % ophthalmic solution INSTILL 1 DROP INTO RIGHT EYE EVERY MORNING.  Marland Kitchen topiramate (TOPAMAX) 50 MG tablet Take 50 mg by mouth 2 (two) times daily.    No facility-administered encounter medications on file as of 07/21/2016.    ALLERGIES: Allergies  Allergen Reactions  . Codeine    VACCINATION STATUS: Immunization History  Administered Date(s) Administered  . Influenza Whole 11/24/2010  . Influenza,inj,Quad PF,36+ Mos 01/29/2013, 01/02/2014, 04/18/2016  . Influenza-Unspecified 12/19/2011, 12/25/2013  . Pneumococcal Conjugate-13 01/29/2013    HPI  71 yr old female with multiple medical problems as above. She is here to f/u for abnormal TFTs Consistent with subclinical  hyperthyroidism.  she is still a nursing home resident due to failure to thrive.  Patient is a poor historian. she has Lost 7 pounds  since last visit.  Per  her caretaker, she eats well , no complaint of palpitations   her thyroid u/s is unremarkable, and repeat TFTs are stable. Her prior thyroid uptake was 13 % on 01/14/2014 with a dominant warm nodule on left lobe.   Review of Systems   Constitutional:  she has lost 7 lbs of  weight,  no fatigue, no subjective hyperthermia/hypothermia Eyes: no blurry vision, no xerophthalmia ENT: no sore throat, no nodules palpated in throat, no dysphagia/odynophagia, no hoarseness Cardiovascular: no CP/SOB/palpitations/leg swelling Respiratory: no cough/SOB Gastrointestinal: no N/V/D/C Musculoskeletal: no muscle/joint aches Skin: no rashes Neurological: no tremors/numbness/tingling/dizziness Psychiatric: no depression/anxiety   Objective:    BP 100/66   Pulse 76   Wt 148 lb (67.1 kg)   BMI 28.90 kg/m   Wt Readings from Last 3 Encounters:  07/21/16 148 lb (67.1 kg)  04/18/16 151 lb 9.6 oz (68.8 kg)  01/21/16 155 lb (70.3 kg)    Physical Exam  Constitutional: Stable state of mind, in NAD Eyes: PERRLA, EOMI, no exophthalmos ENT: moist mucous membranes, no thyromegaly, no cervical lymphadenopathy Cardiovascular: RRR, No MRG Respiratory: CTA B Gastrointestinal: abdomen soft, NT, ND, BS+ Musculoskeletal: uses her walker due to disequilibrium. Skin: moist, warm, no rashes Neurological: no tremor with outstretched hands, DTR normal in all 4  Results for orders placed or performed in visit on 07/12/16  TSH  Result Value Ref Range   TSH 0.11 (L) mIU/L  T4, free  Result Value Ref Range   Free T4 1.1 0.8 - 1.8 ng/dL   Complete Blood Count (Most recent): Lab Results  Component Value Date   WBC 6.7 04/20/2016   HGB 12.6 04/20/2016   HCT 38.7 04/20/2016   MCV 84.1 04/20/2016   PLT 224 04/20/2016   Chemistry (most recent): Lab  Results  Component Value Date   NA 145 04/20/2016   K 4.0 04/20/2016   CL 111 (H) 04/20/2016   CO2 26 04/20/2016   BUN 18 04/20/2016   CREATININE 1.20 (H) 04/20/2016   Diabetic Labs (most recent): Lab Results  Component Value Date   HGBA1C 5.4 04/20/2016   HGBA1C 5.8 (H) 01/19/2015   HGBA1C 6.1 (H) 06/19/2014      Assessment & Plan:   1.Subclinical hyperthyroidism: 2. Toxic nodule.  - Her labs still shows subclinical hyperthyroidism with TSH more suppressed at 0.11. She has unintended weight loss of 7 pounds since last visit. She would benefit from a low-dose of Tapazole 5 mg by mouth every day with breakfast.  her prior u/s showed small nodules.  Her presentation is not specific but  she has mildly toxic nodule. she will not need ablative therapy. She will have repeat labs in 3 months with office visit.  I advised patient to maintain close follow up with their PCP for primary care needs. Follow up plan: Return in about 3 months (around 10/21/2016) for follow up with pre-visit labs.  Glade Lloyd, MD Phone: [phone]  Fax: [phone]   07/21/2016, 10:43 AM

## 2016-08-01 ENCOUNTER — Emergency Department (HOSPITAL_COMMUNITY)
Admission: EM | Admit: 2016-08-01 | Discharge: 2016-08-01 | Disposition: A | Discharge status: Home / Self Care | Payer: Medicare Other | Attending: Emergency Medicine | Admitting: Emergency Medicine

## 2016-08-01 ENCOUNTER — Encounter (HOSPITAL_COMMUNITY): Payer: Self-pay | Admitting: Emergency Medicine

## 2016-08-01 ENCOUNTER — Emergency Department (HOSPITAL_COMMUNITY): Payer: Medicare Other

## 2016-08-01 DIAGNOSIS — N200 Calculus of kidney: Secondary | ICD-10-CM | POA: Diagnosis not present

## 2016-08-01 DIAGNOSIS — E119 Type 2 diabetes mellitus without complications: Secondary | ICD-10-CM | POA: Insufficient documentation

## 2016-08-01 DIAGNOSIS — Z7982 Long term (current) use of aspirin: Secondary | ICD-10-CM | POA: Insufficient documentation

## 2016-08-01 DIAGNOSIS — Z79899 Other long term (current) drug therapy: Secondary | ICD-10-CM | POA: Insufficient documentation

## 2016-08-01 DIAGNOSIS — R109 Unspecified abdominal pain: Secondary | ICD-10-CM | POA: Diagnosis present

## 2016-08-01 DIAGNOSIS — K6289 Other specified diseases of anus and rectum: Secondary | ICD-10-CM | POA: Diagnosis not present

## 2016-08-01 DIAGNOSIS — R1031 Right lower quadrant pain: Secondary | ICD-10-CM | POA: Diagnosis not present

## 2016-08-01 DIAGNOSIS — I10 Essential (primary) hypertension: Secondary | ICD-10-CM | POA: Insufficient documentation

## 2016-08-01 LAB — COMPREHENSIVE METABOLIC PANEL
ALBUMIN: 3.5 g/dL (ref 3.5–5.0)
ALT: 7 U/L — AB (ref 14–54)
ANION GAP: 8 (ref 5–15)
AST: 13 U/L — ABNORMAL LOW (ref 15–41)
Alkaline Phosphatase: 66 U/L (ref 38–126)
BUN: 19 mg/dL (ref 6–20)
CHLORIDE: 109 mmol/L (ref 101–111)
CO2: 25 mmol/L (ref 22–32)
CREATININE: 1.09 mg/dL — AB (ref 0.44–1.00)
Calcium: 9 mg/dL (ref 8.9–10.3)
GFR calc non Af Amer: 50 mL/min — ABNORMAL LOW (ref >60)
GFR, EST AFRICAN AMERICAN: 58 mL/min — AB (ref >60)
GLUCOSE: 98 mg/dL (ref 65–99)
Potassium: 3.5 mmol/L (ref 3.5–5.1)
Sodium: 142 mmol/L (ref 135–145)
Total Bilirubin: 0.3 mg/dL (ref 0.3–1.2)
Total Protein: 6.6 g/dL (ref 6.5–8.1)

## 2016-08-01 LAB — CBC WITH DIFFERENTIAL/PLATELET
Basophils Absolute: 0 10*3/uL (ref 0.0–0.1)
Basophils Relative: 0 %
Eosinophils Absolute: 0.2 10*3/uL (ref 0.0–0.7)
Eosinophils Relative: 3 %
HEMATOCRIT: 37.5 % (ref 36.0–46.0)
HEMOGLOBIN: 12.3 g/dL (ref 12.0–15.0)
LYMPHS ABS: 2.1 10*3/uL (ref 0.7–4.0)
Lymphocytes Relative: 29 %
MCH: 27.5 pg (ref 26.0–34.0)
MCHC: 32.8 g/dL (ref 30.0–36.0)
MCV: 83.9 fL (ref 78.0–100.0)
MONOS PCT: 7 %
Monocytes Absolute: 0.5 10*3/uL (ref 0.1–1.0)
NEUTROS ABS: 4.4 10*3/uL (ref 1.7–7.7)
NEUTROS PCT: 61 %
Platelets: 211 10*3/uL (ref 150–400)
RBC: 4.47 MIL/uL (ref 3.87–5.11)
RDW: 13.5 % (ref 11.5–15.5)
WBC: 7.2 10*3/uL (ref 4.0–10.5)

## 2016-08-01 LAB — URINALYSIS, ROUTINE W REFLEX MICROSCOPIC
BILIRUBIN URINE: NEGATIVE
GLUCOSE, UA: NEGATIVE mg/dL
HGB URINE DIPSTICK: NEGATIVE
KETONES UR: NEGATIVE mg/dL
Leukocytes, UA: NEGATIVE
Nitrite: NEGATIVE
PROTEIN: NEGATIVE mg/dL
Specific Gravity, Urine: 1.013 (ref 1.005–1.030)
pH: 6 (ref 5.0–8.0)

## 2016-08-01 LAB — TROPONIN I: Troponin I: <0.03 ng/mL (ref <0.03)

## 2016-08-01 MED ORDER — ONDANSETRON HCL 4 MG/2ML IJ SOLN
4.0000 mg | Freq: Once | INTRAMUSCULAR | Status: DC
  Filled 2016-08-01: qty 2

## 2016-08-01 MED ORDER — MORPHINE SULFATE (PF) 4 MG/ML IV SOLN
4.0000 mg | Freq: Once | INTRAVENOUS | Status: AC
  Administered 2016-08-01: 4 mg via INTRAMUSCULAR

## 2016-08-01 MED ORDER — HYDROCORTISONE ACETATE 25 MG RE SUPP: 25.0000 mg | Freq: Two times a day (BID) | RECTAL | 0 refills | Status: DC

## 2016-08-01 MED ORDER — MORPHINE SULFATE (PF) 4 MG/ML IV SOLN
4.0000 mg | Freq: Once | INTRAVENOUS | Status: DC
  Filled 2016-08-01: qty 1

## 2016-08-01 MED ORDER — ONDANSETRON 8 MG PO TBDP
8.0000 mg | ORAL_TABLET | Freq: Once | ORAL | Status: AC
  Administered 2016-08-01: 8 mg via ORAL
  Filled 2016-08-01: qty 1

## 2016-08-01 NOTE — ED Notes (Signed)
PT's caretaker from [facility] updated on pt's status at this time and her name is Charma Igo [phone].

## 2016-08-01 NOTE — ED Provider Notes (Signed)
[facility] DEPT Provider Note   CSN: [id number] Arrival date & time: 08/01/16  1403     History   Chief Complaint Chief Complaint  Patient presents with  . Flank Pain    HPI [name] is a 71 y.o. female.  Patient is a 71 year old female with past medical history of diabetes, hypertension, schizophrenia, and bipolar disorder. She presents for evaluation of right flank pain. She was sent here from the group home where she resides for evaluation of this. Patient states this started yesterday and is worsening. The pain comes and goes and is worse with movement. She denies any weakness or numbness of her legs. She denies any bowel or bladder complaints. She denies any fevers or chills.   The history is provided by the patient.  Flank Pain  This is a new problem. The current episode started yesterday. The problem occurs constantly. The problem has been gradually worsening. Pertinent negatives include no chest pain and no abdominal pain. Nothing aggravates the symptoms. Nothing relieves the symptoms. She has tried nothing for the symptoms.    Past Medical History:  Diagnosis Date  . Adenomatous colon polyp   . Bipolar 1 disorder ([place])   . Diabetes mellitus   . Diverticula of colon Colonoscopy 2013  . Glaucoma   . Hypertension   . OSA (obstructive sleep apnea)   . Schizophrenia ([place])   . Stroke [facility])    2 TIA  . Thyroid disease 2010  . Urinary incontinence, mixed     Patient Active Problem List   Diagnosis Date Noted  . Gait instability 06/11/2015  . Bipolar disorder ([place]) 01/19/2015  . Chronic insomnia 01/19/2015  . Seasonal allergies 06/19/2014  . Loss of weight 10/01/2013  . Subclinical hyperthyroidism 07/01/2013  . Ingrown toenail without infection 06/25/2013  . Neuropathy, peripheral 09/24/2012  . Bladder incontinence 04/29/2012  . Visual disturbance 05/03/2011  . Hx of adenomatous colonic polyps 04/12/2011  . Ingrown right big toenail 04/11/2011  .  Shoulder pain 04/11/2011  . Hyperlipidemia 11/24/2010  . Schizophrenia ([place]) 11/12/2010  . Diabetes mellitus ([place]) 11/10/2010  . HTN (hypertension) 11/10/2010  . Bilateral leg edema 11/10/2010    Past Surgical History:  Procedure Laterality Date  . ABDOMINAL HYSTERECTOMY    . COLONOSCOPY  2007   pancolonic diverticulosis, tcs in 2012 due to h/o adenomatous polyps  . COLONOSCOPY  11/15/2011   Procedure: COLONOSCOPY;  Surgeon: Daneil Dolin, MD;  Location: [facility];  Service: Endoscopy;  Laterality: N/A;  7:30  . 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 MULTIPLE TOOTH EXTRACTIONS      OB History    No data available       Home Medications    Prior to Admission medications   Medication Sig Start Date End Date Taking? Authorizing Provider  amLODipine (NORVASC) 10 MG tablet TAKE 1 TABLET BY MOUTH ONCE DAILY. 02/22/15   Alycia Rossetti, MD  aspirin EC 325 MG tablet TAKE 1 TABLET BY MOUTH ONCE DAILY. 02/22/15   Alycia Rossetti, MD  CALCIUM 600+D 600-200 MG-UNIT tablet TAKE 1 TABLET BY MOUTH TWICE DAILY. 02/22/15   Alycia Rossetti, MD  carvedilol (COREG) 6.25 MG tablet TAKE 1 TABLET BY MOUTH TWICE DAILY WITH A MEAL. 02/22/15   Alycia Rossetti, MD  ciprofloxacin (CIPRO) 500 MG tablet Take 1 tablet (500 mg total) by mouth 2 (two) times daily. 12/09/15   Dena Billet B, PA-C  cloNIDine (CATAPRES) 0.1  MG tablet Take 1 tablet (0.1 mg total) by mouth every evening. 04/18/16   Alycia Rossetti, MD  clopidogrel (PLAVIX) 75 MG tablet TAKE 1 TABLET BY MOUTH ONCE DAILY. 02/22/15   [place], Modena Nunnery, MD  FANAPT 4 MG TABS tablet TAKE 1 TABLET BY MOUTH TWICE DAILY. 06/12/14   Alycia Rossetti, MD  furosemide (LASIX) 20 MG tablet TAKE 1 TABLET BY MOUTH ONCE DAILY. 02/22/15   Alycia Rossetti, MD  Glucose Blood (BLOOD GLUCOSE TEST STRIPS) STRP Use to monitor FSBS 1x daily. Dx: E11.9. 06/19/14   Alycia Rossetti, MD  HYDROcodone-acetaminophen (NORCO/VICODIN) 5-325 MG tablet Take 1 tablet by mouth  every 4 (four) hours as needed for moderate pain.    [provider]  ketorolac (ACULAR) 0.5 % ophthalmic solution Place 1 drop into both eyes 4 (four) times daily.    [provider]  latanoprost (XALATAN) 0.005 % ophthalmic solution INSTILL 1 DROP INTO RIGHT EYE AT BEDTIME. 02/22/15   [place], Modena Nunnery, MD  lisinopril (PRINIVIL,ZESTRIL) 40 MG tablet TAKE 1 TABLET BY MOUTH ONCE DAILY. 02/22/15   Alycia Rossetti, MD  loratadine (CLARITIN) 10 MG tablet Take 1 tablet (10 mg total) by mouth daily as needed for allergies. 06/19/14   Alycia Rossetti, MD  methimazole (TAPAZOLE) 5 MG tablet Take 1 tablet (5 mg total) by mouth daily. 07/21/16   Cassandria Anger, MD  moxifloxacin (VIGAMOX) 0.5 % ophthalmic solution Place 1 drop into both eyes 4 (four) times daily.    [provider]  perphenazine (TRILAFON) 4 MG tablet TAKE 1 TABLET BY MOUTH TWICE DAILY. 06/12/14   Alycia Rossetti, MD  pravastatin (PRAVACHOL) 20 MG tablet TAKE 1 TABLET BY MOUTH EVERY EVENING. 02/22/15   Alycia Rossetti, MD  prednisoLONE acetate (PRED FORTE) 1 % ophthalmic suspension 1 drop 4 (four) times daily.    [provider]  temazepam (RESTORIL) 30 MG capsule Take 1 capsule (30 mg total) by mouth at bedtime as needed for sleep. 01/28/16   [place], Modena Nunnery, MD  timolol (TIMOPTIC) 0.5 % ophthalmic solution INSTILL 1 DROP INTO RIGHT EYE EVERY MORNING. 01/08/15   Alycia Rossetti, MD  topiramate (TOPAMAX) 50 MG tablet Take 50 mg by mouth 2 (two) times daily.     [provider]    Family History Family History  Problem Relation Age of Onset  . Other Unknown        unknown  . Thyroid disease Daughter        Partial thyroid removal/tumor     Social History Social History  Substance Use Topics  . Smoking status: Never Smoker  . Smokeless tobacco: Never Used  . Alcohol use No     Allergies   Codeine   Review of Systems Review of Systems  Cardiovascular: Negative for  chest pain.  Gastrointestinal: Negative for abdominal pain.  Genitourinary: Positive for flank pain.  All other systems reviewed and are negative.    Physical Exam Updated Vital Signs Pulse (!) 56   Temp 97.7 F (36.5 C) (Oral)   Resp 19   Ht 5\' 5"  (1.651 m)   Wt 63.5 kg (140 lb)   SpO2 99%   BMI 23.30 kg/m   Physical Exam  Constitutional: She is oriented to person, place, and time. She appears well-developed and well-nourished. No distress.  HENT:  Head: Normocephalic and atraumatic.  Mouth/Throat: Oropharynx is clear and moist.  Neck: Normal range of motion. Neck supple.  Cardiovascular:  Normal rate and regular rhythm.  Exam reveals no gallop and no friction rub.   No murmur heard. Pulmonary/Chest: Effort normal and breath sounds normal. No respiratory distress. She has no wheezes.  Abdominal: Soft. Bowel sounds are normal. She exhibits no distension. There is no tenderness.  There is mild tenderness in the right flank.  Musculoskeletal: Normal range of motion.  Neurological: She is alert and oriented to person, place, and time. No cranial nerve deficit.  There is a baseline tremor noted.  Skin: Skin is warm and dry. She is not diaphoretic.  Nursing note and vitals reviewed.    ED Treatments / Results  Labs (all labs ordered are listed, but only abnormal results are displayed) Labs Reviewed  URINALYSIS, ROUTINE W REFLEX MICROSCOPIC  COMPREHENSIVE METABOLIC PANEL  CBC WITH DIFFERENTIAL/PLATELET  TROPONIN I    EKG  EKG Interpretation None       Radiology No results found.  Procedures Procedures (including critical care time)  Medications Ordered in ED Medications  morphine 4 MG/ML injection 4 mg (not administered)  ondansetron (ZOFRAN) injection 4 mg (not administered)     Initial Impression / Assessment and Plan / ED Course  I have reviewed the triage vital signs and the nursing notes.  Pertinent labs & imaging results that were available during  my care of the patient were reviewed by me and considered in my medical decision making (see chart for details).  Patient presents here with complaints of flank pain, the etiology of which I am uncertain. Her urine is clear, laboratory studies are reassuring, and CT scan shows no evidence for renal calculus. The only abnormal finding is a thickened rectum with surrounding edema consistent with proctitis. This will be treated with Anusol suppositories. She is to return as needed for any problems.  Final Clinical Impressions(s) / ED Diagnoses   Final diagnoses:  None    New Prescriptions New Prescriptions   No medications on file     Veryl Speak, MD 08/01/16 1946

## 2016-08-01 NOTE — ED Notes (Signed)
Pt sleeping. 

## 2016-08-01 NOTE — ED Triage Notes (Signed)
Per EMS patient from [facility]. Patient complains of right flank pain with urinary frequency x 1 day.

## 2016-08-01 NOTE — Discharge Instructions (Signed)
Anusol suppositories as prescribed.  Return to the Emergency Department if symptoms worsen or change.

## 2016-08-01 NOTE — ED Notes (Signed)
Called Charma Igo with [facility] to make her aware of pt's discharge instructions. [phone]

## 2016-08-02 ENCOUNTER — Telehealth: Payer: Self-pay | Admitting: *Deleted

## 2016-08-02 NOTE — Telephone Encounter (Signed)
Received call from patient caregiver Lelon Frohlich with [facility] [phone].  Reports that patient was seen in ER last night and given prescription for Anusol suppositories. States that insurance does not cover.   Please advise.

## 2016-08-02 NOTE — Telephone Encounter (Signed)
Send over Hydrocortisone and give rectal tip

## 2016-08-03 MED ORDER — HYDROCORTISONE 1 % RE CREA: TOPICAL_CREAM | RECTAL | 3 refills | Status: DC

## 2016-08-03 NOTE — Telephone Encounter (Signed)
Prescription sent to pharmacy.   Call placed to facility and caregiver made aware.

## 2016-08-15 DIAGNOSIS — F259 Schizoaffective disorder, unspecified: Secondary | ICD-10-CM | POA: Diagnosis not present

## 2016-08-18 ENCOUNTER — Encounter: Payer: Self-pay | Admitting: Internal Medicine

## 2016-10-25 ENCOUNTER — Ambulatory Visit: Payer: Medicare Other | Admitting: "Endocrinology

## 2016-11-14 ENCOUNTER — Other Ambulatory Visit: Payer: Self-pay | Admitting: Family Medicine

## 2016-11-28 ENCOUNTER — Encounter: Payer: Self-pay | Admitting: Family Medicine

## 2016-11-28 ENCOUNTER — Ambulatory Visit (INDEPENDENT_AMBULATORY_CARE_PROVIDER_SITE_OTHER): Payer: Medicare Other | Admitting: Family Medicine

## 2016-11-28 VITALS — BP 102/68 | HR 88 | Temp 97.9°F | Resp 14 | Ht 60.0 in | Wt 149.0 lb

## 2016-11-28 DIAGNOSIS — R8290 Unspecified abnormal findings in urine: Secondary | ICD-10-CM | POA: Diagnosis not present

## 2016-11-28 DIAGNOSIS — N390 Urinary tract infection, site not specified: Secondary | ICD-10-CM | POA: Diagnosis not present

## 2016-11-28 DIAGNOSIS — R319 Hematuria, unspecified: Secondary | ICD-10-CM | POA: Diagnosis not present

## 2016-11-28 DIAGNOSIS — R410 Disorientation, unspecified: Secondary | ICD-10-CM | POA: Diagnosis not present

## 2016-11-28 DIAGNOSIS — I10 Essential (primary) hypertension: Secondary | ICD-10-CM | POA: Diagnosis not present

## 2016-11-28 LAB — BASIC METABOLIC PANEL
BUN/Creatinine Ratio: 15 (calc) (ref 6–22)
BUN: 20 mg/dL (ref 7–25)
CO2: 23 mmol/L (ref 20–32)
Calcium: 9.4 mg/dL (ref 8.6–10.4)
Chloride: 108 mmol/L (ref 98–110)
Creat: 1.34 mg/dL — ABNORMAL HIGH (ref 0.60–0.93)
GLUCOSE: 107 mg/dL — AB (ref 65–99)
Potassium: 3.8 mmol/L (ref 3.5–5.3)
Sodium: 143 mmol/L (ref 135–146)

## 2016-11-28 LAB — CBC
HCT: 42.1 % (ref 35.0–45.0)
HEMOGLOBIN: 13.9 g/dL (ref 11.7–15.5)
MCH: 28.5 pg (ref 27.0–33.0)
MCHC: 33 g/dL (ref 32.0–36.0)
MCV: 86.4 fL (ref 80.0–100.0)
Platelets: 242 10*3/uL (ref 140–400)
RBC: 4.87 10*6/uL (ref 3.80–5.10)
RDW: 14.4 % (ref 11.0–15.0)
WBC: 7.5 10*3/uL (ref 3.8–10.8)

## 2016-11-28 LAB — URINALYSIS, ROUTINE W REFLEX MICROSCOPIC
Bilirubin Urine: NEGATIVE
GLUCOSE, UA: NEGATIVE
HYALINE CAST: NONE SEEN /LPF
Ketones, ur: NEGATIVE
Nitrite: NEGATIVE
Specific Gravity, Urine: 1.015 (ref 1.001–1.03)
pH: 6 (ref 5.0–8.0)

## 2016-11-28 LAB — MICROSCOPIC MESSAGE

## 2016-11-28 MED ORDER — AMLODIPINE BESYLATE 5 MG PO TABS: 5.0000 mg | ORAL_TABLET | Freq: Every day | ORAL | 2 refills | Status: DC

## 2016-11-28 MED ORDER — TRAMADOL HCL 50 MG PO TABS: 50.0000 mg | ORAL_TABLET | Freq: Two times a day (BID) | ORAL | 0 refills | Status: DC | PRN

## 2016-11-28 MED ORDER — CEPHALEXIN 250 MG PO CAPS: 250.0000 mg | ORAL_CAPSULE | Freq: Four times a day (QID) | ORAL | 0 refills | Status: DC

## 2016-11-28 NOTE — Progress Notes (Signed)
   Subjective:    Patient ID: [name], female    DOB: February 27, 1946, 71 y.o.   MRN: [id number]  Patient presents for Malodorous Urine (caregiver states that urine is dark with foul odor- incontinent and unable to leave sample)  Patient with her caregiver from her assisted living. She's had a foul odor to her urine has had increased incontinence and had some confusion per phone call. Care giver with her today does not have any nursing notes or idea otherwise why she is here. She's not had any fever. She denies any symptoms, feels fine  She still following with her psychiatrist changes to the medications. She follows with endocrinology for her thyroid disorder Hypertension she has been taking the blood pressure medicine as prescribed no side effects.Reviewed chart, has had low normotensive BP readings 100-126/57-72  She denies any pain, no burning with urination, no fever, no cough   as her last visit she was seen in the emergency room back in May diagnosed with proctitis she was treated with hydrocortisone suppositories  Review Of Systems:  GEN- denies fatigue, fever, weight loss,weakness, recent illness HEENT- denies eye drainage, change in vision, nasal discharge, CVS- denies chest pain, palpitations RESP- denies SOB, cough, wheeze ABD- denies N/V, change in stools, abd pain GU- denies dysuria, hematuria, dribbling, incontinence MSK- denies joint pain, muscle aches, injury Neuro- denies headache, dizziness, syncope, seizure activity       Objective:    BP 102/68   Pulse 88   Temp 97.9 F (36.6 C) (Oral)   Resp 14   Ht 5' (1.524 m)   Wt 149 lb (67.6 kg)   SpO2 100%   BMI 29.10 kg/m  GEN- NAD, alert and oriented x3 HEENT- Right PUPIL REACTIVE, EOMI, non injected sclera, pink conjunctiva, MMM, oropharynx clear Neck- Supple, no LAD  CVS- RRR, no murmur RESP-CTAB ABD-NABS,soft,NT,ND , no CVA tenderness  EXT- trace ankle edema Neuro- short Shuffling gait, unsteady -using  walker improves balance  Pulses- Radial - 2+  Cath urine at bedside, caregiver and patient notfied or procedure      Assessment & Plan:      Problem List Items Addressed This Visit      Unprioritized   HTN (hypertension)    Low normotensive labs the past few readings in the chart even from other offices  Decrease norvasc to 5mg        Relevant Medications   amLODipine (NORVASC) 5 MG tablet    Other Visit Diagnoses    Urinary tract infection with hematuria, site unspecified    -  Primary   Cath specimen, may have caused some of hte blood but leukocytes/full of bacteria on microscopy start keflex, no confusion today appears at baseline, culture sent as well.  BMET pending    Relevant Medications   cephALEXin (KEFLEX) 250 MG capsule   Other Relevant Orders   Urinalysis, Routine w reflex microscopic   Basic metabolic panel   Urine Culture   Confusion       Relevant Orders   CBC   Basic metabolic panel      Note: This dictation was prepared with Dragon dictation along with smaller phrase technology. Any transcriptional errors that result from this process are unintentional.

## 2016-11-28 NOTE — Patient Instructions (Addendum)
Continue current medications Take antibiotics as prescribed  Norvasc decresaed to 5mg  daily Take antibiotics as prescribed F/U 3 months

## 2016-11-28 NOTE — Assessment & Plan Note (Signed)
Low normotensive labs the past few readings in the chart even from other offices  Decrease norvasc to 5mg 

## 2016-11-29 ENCOUNTER — Encounter: Payer: Self-pay | Admitting: *Deleted

## 2016-11-30 LAB — URINE CULTURE
MICRO NUMBER:: 81029867
SPECIMEN QUALITY:: ADEQUATE

## 2016-12-14 ENCOUNTER — Other Ambulatory Visit: Payer: Self-pay

## 2016-12-14 DIAGNOSIS — B351 Tinea unguium: Secondary | ICD-10-CM | POA: Diagnosis not present

## 2016-12-14 DIAGNOSIS — E1151 Type 2 diabetes mellitus with diabetic peripheral angiopathy without gangrene: Secondary | ICD-10-CM | POA: Diagnosis not present

## 2016-12-14 MED ORDER — METHIMAZOLE 5 MG PO TABS: 5.0000 mg | ORAL_TABLET | Freq: Every day | ORAL | 1 refills | Status: DC

## 2016-12-15 ENCOUNTER — Ambulatory Visit: Payer: Medicare Other | Admitting: Podiatry

## 2016-12-26 ENCOUNTER — Ambulatory Visit (INDEPENDENT_AMBULATORY_CARE_PROVIDER_SITE_OTHER): Payer: Medicare Other | Admitting: Family Medicine

## 2016-12-26 ENCOUNTER — Encounter: Payer: Self-pay | Admitting: Family Medicine

## 2016-12-26 VITALS — BP 104/62 | HR 64 | Temp 97.7°F | Resp 18 | Ht 60.0 in | Wt 145.0 lb

## 2016-12-26 DIAGNOSIS — N390 Urinary tract infection, site not specified: Secondary | ICD-10-CM | POA: Diagnosis not present

## 2016-12-26 DIAGNOSIS — R4182 Altered mental status, unspecified: Secondary | ICD-10-CM | POA: Diagnosis not present

## 2016-12-26 DIAGNOSIS — L89152 Pressure ulcer of sacral region, stage 2: Secondary | ICD-10-CM

## 2016-12-26 DIAGNOSIS — Z9189 Other specified personal risk factors, not elsewhere classified: Secondary | ICD-10-CM | POA: Diagnosis not present

## 2016-12-26 LAB — COMPREHENSIVE METABOLIC PANEL
AG RATIO: 1.4 (calc) (ref 1.0–2.5)
ALT: 4 U/L — ABNORMAL LOW (ref 6–29)
AST: 10 U/L (ref 10–35)
Albumin: 3.8 g/dL (ref 3.6–5.1)
Alkaline phosphatase (APISO): 64 U/L (ref 33–130)
BUN / CREAT RATIO: 19 (calc) (ref 6–22)
BUN: 25 mg/dL (ref 7–25)
CO2: 24 mmol/L (ref 20–32)
CREATININE: 1.32 mg/dL — AB (ref 0.60–0.93)
Calcium: 9.8 mg/dL (ref 8.6–10.4)
Chloride: 109 mmol/L (ref 98–110)
GLUCOSE: 100 mg/dL — AB (ref 65–99)
Globulin: 2.8 g/dL (calc) (ref 1.9–3.7)
Potassium: 4 mmol/L (ref 3.5–5.3)
Sodium: 144 mmol/L (ref 135–146)
TOTAL PROTEIN: 6.6 g/dL (ref 6.1–8.1)
Total Bilirubin: 0.3 mg/dL (ref 0.2–1.2)

## 2016-12-26 LAB — URINALYSIS, ROUTINE W REFLEX MICROSCOPIC
BILIRUBIN URINE: NEGATIVE
Glucose, UA: NEGATIVE
KETONES UR: NEGATIVE
NITRITE: POSITIVE — AB
PROTEIN: NEGATIVE
Specific Gravity, Urine: 1.015 (ref 1.001–1.03)
pH: 6 (ref 5.0–8.0)

## 2016-12-26 LAB — CBC
HEMATOCRIT: 38.1 % (ref 35.0–45.0)
HEMOGLOBIN: 12.4 g/dL (ref 11.7–15.5)
MCH: 28.1 pg (ref 27.0–33.0)
MCHC: 32.5 g/dL (ref 32.0–36.0)
MCV: 86.2 fL (ref 80.0–100.0)
Platelets: 241 10*3/uL (ref 140–400)
RBC: 4.42 10*6/uL (ref 3.80–5.10)
RDW: 13.9 % (ref 11.0–15.0)
WBC: 7.6 10*3/uL (ref 3.8–10.8)

## 2016-12-26 LAB — MICROSCOPIC MESSAGE

## 2016-12-26 MED ORDER — CEFTRIAXONE SODIUM 500 MG IJ SOLR
500.0000 mg | Freq: Once | INTRAMUSCULAR | Status: AC
  Administered 2016-12-26: 500 mg via INTRAMUSCULAR

## 2016-12-26 MED ORDER — CIPROFLOXACIN HCL 500 MG PO TABS: 500.0000 mg | ORAL_TABLET | Freq: Two times a day (BID) | ORAL | 0 refills | Status: DC

## 2016-12-26 NOTE — Patient Instructions (Addendum)
STOP The lisinopril  Hold lasix for 3 days Give the antibiotics - Cipro twice a day  Wound Care to be sent to ALF  F/U Friday for recheck

## 2016-12-26 NOTE — Progress Notes (Signed)
Subjective:    Patient ID: [name], female    DOB: Aug 31, 1945, 71 y.o.   MRN: [id number]  Patient presents for Confusion (per caregiver- increased confusion) and Open Area to B Buttocks (pressure sore to buttocks)  PATIENT HERE WITH CAREGIVER FROM HER ASSISTED LIVING FACILITY. For the past week or so the timing is unknown states that she has not been acting herself she will not feed herself she seems to be confused all the time and is weak. They think that she needs a higher level of care.culture came back with Aerococcus Viridans. She was prescribed cephalexin at that time. Her CBC was normal creatinine was mildly elevated at 1.34. She denies any pain anywhere but states that she has felt weak. No known fever however the caregiver that is with her today did not know any specifics about how sick she has been. There was no no dictation from the assisted living either. There's been no change in her bowels no cough or congestion reported.   Review Of Systems:  GEN- + fatigue, fever, weight loss,+weakness, recent illness HEENT- denies eye drainage, change in vision, nasal discharge, CVS- denies chest pain, palpitations RESP- denies SOB, cough, wheeze ABD- denies N/V, change in stools, abd pain GU- denies dysuria, hematuria, dribbling, incontinence MSK- denies joint pain, muscle aches, injury Neuro- denies headache, dizziness, syncope, seizure activity       Objective:    BP 104/62   Pulse 64   Temp 97.7 F (36.5 C) (Oral)   Resp 18   Ht 5' (1.524 m)   Wt 145 lb (65.8 kg)   SpO2 99%   BMI 28.32 kg/m   Repeat BP 104/70 GEN- NAD, alert and oriented x person/place, sits slumped over in chair, typical posture  HEENT- Right pupil reactive  EOMI, non injected sclera, pink conjunctiva,mild dry MM, oropharynx clear    Neck- Suppleono LAD CVS- RRR, no murmur RESP-CTAB ABD-NABS,soft,NT,ND, no CVA tenderness  Skin- stage 2 decbutis ulceration both sides of buttocks, mild TTP no  drainage or odor, wears depends  EXT- chronic trace edema Pulses- Radial  2+        Assessment & Plan:      Problem List Items Addressed This Visit    None    Visit Diagnoses    Altered mental status, unspecified altered mental status type    -  Primary   She has some hypotension but not fever, UTI, qualfies for SIRS at least.   Relevant Orders   Urinalysis, Routine w reflex microscopic (Completed)   Comprehensive metabolic panel   CBC   Urinary tract infection without hematuria, site unspecified       UA from cath specimen, looks worse than last week, given Rocephin 500mg  IM in office Her CBC was unremarkable again. May try treating her as an outpatient with ciprofloxacin she is still walking at her baseline with short shuffling gait. She is answering my questions at baseline. Discussed increasing her fluid intake as well. Her metabolic panel is still pending if there is anything significant on that and she will be directed to the emergency room. She not think have any pain or discomfort on examination. I'm also didn't discontinue her lisinopril and hold her Lasix for 3 days she's not been eating and drinking as well as has the infection. Skilled nurse will be sent to her home to take care of the decubitus ulcer. I will also talk to the person who owns her facility at [facility]  Daphne Williamsons there was an indirect message about moving her to a higher level of care which her daughter would need to be in on or whoever has guardianship. Recheck Friday  Discussed the red flags went to take her to the emergency room and something of changes   Relevant Medications   cefTRIAXone (ROCEPHIN) injection 500 mg (Completed)   Other Relevant Orders   Comprehensive metabolic panel   CBC   Urine Culture   Decubitus ulcer of sacral region, stage 2       Duoderm applied, will need wound care       Note: This dictation was prepared with Dragon dictation along with smaller phrase technology.  Any transcriptional errors that result from this process are unintentional.

## 2016-12-29 ENCOUNTER — Encounter: Payer: Self-pay | Admitting: Family Medicine

## 2016-12-29 ENCOUNTER — Ambulatory Visit (INDEPENDENT_AMBULATORY_CARE_PROVIDER_SITE_OTHER): Payer: Medicare Other | Admitting: Family Medicine

## 2016-12-29 VITALS — BP 110/74 | HR 59 | Temp 97.8°F | Resp 16 | Ht 60.0 in | Wt 148.8 lb

## 2016-12-29 DIAGNOSIS — R32 Unspecified urinary incontinence: Secondary | ICD-10-CM | POA: Diagnosis not present

## 2016-12-29 DIAGNOSIS — Z7982 Long term (current) use of aspirin: Secondary | ICD-10-CM | POA: Diagnosis not present

## 2016-12-29 DIAGNOSIS — F209 Schizophrenia, unspecified: Secondary | ICD-10-CM | POA: Diagnosis not present

## 2016-12-29 DIAGNOSIS — F319 Bipolar disorder, unspecified: Secondary | ICD-10-CM | POA: Diagnosis not present

## 2016-12-29 DIAGNOSIS — N390 Urinary tract infection, site not specified: Secondary | ICD-10-CM | POA: Diagnosis not present

## 2016-12-29 DIAGNOSIS — H539 Unspecified visual disturbance: Secondary | ICD-10-CM | POA: Diagnosis not present

## 2016-12-29 DIAGNOSIS — Z23 Encounter for immunization: Secondary | ICD-10-CM | POA: Diagnosis not present

## 2016-12-29 DIAGNOSIS — E1142 Type 2 diabetes mellitus with diabetic polyneuropathy: Secondary | ICD-10-CM | POA: Diagnosis not present

## 2016-12-29 DIAGNOSIS — L89322 Pressure ulcer of left buttock, stage 2: Secondary | ICD-10-CM | POA: Diagnosis not present

## 2016-12-29 DIAGNOSIS — I10 Essential (primary) hypertension: Secondary | ICD-10-CM | POA: Diagnosis not present

## 2016-12-29 DIAGNOSIS — L89312 Pressure ulcer of right buttock, stage 2: Secondary | ICD-10-CM | POA: Diagnosis not present

## 2016-12-29 DIAGNOSIS — L89320 Pressure ulcer of left buttock, unstageable: Secondary | ICD-10-CM | POA: Diagnosis not present

## 2016-12-29 DIAGNOSIS — Z7902 Long term (current) use of antithrombotics/antiplatelets: Secondary | ICD-10-CM | POA: Diagnosis not present

## 2016-12-29 DIAGNOSIS — Z4800 Encounter for change or removal of nonsurgical wound dressing: Secondary | ICD-10-CM | POA: Diagnosis not present

## 2016-12-29 DIAGNOSIS — L89310 Pressure ulcer of right buttock, unstageable: Secondary | ICD-10-CM | POA: Diagnosis not present

## 2016-12-29 LAB — URINE CULTURE
MICRO NUMBER:: 81152916
SPECIMEN QUALITY: ADEQUATE

## 2016-12-29 NOTE — Patient Instructions (Signed)
F/U December as previous

## 2016-12-29 NOTE — Progress Notes (Signed)
   Subjective:    Patient ID: [name], female    DOB: [birth year], 71 y.o.   MRN: [id number]  Patient presents for Follow-up and Flu Vaccine   Pt here for recheck, diagnosed with UTI currently in cipro. She is now more talkative and seems to have more energy. Walking at baseline, no further confusion. No fever, no URI symptoms She has no complaints today  She would like flu shot   Due to hypotension - lisinopril stopped, lasix on hold for 3 days   HH nurse to start today for wound care of decbuitis ulcer   Review Of Systems:  GEN- denies fatigue, fever, weight loss,weakness, recent illness HEENT- denies eye drainage, change in vision, nasal discharge, CVS- denies chest pain, palpitations RESP- denies SOB, cough, wheeze ABD- denies N/V, change in stools, abd pain GU- denies dysuria, hematuria, dribbling, incontinence MSK- denies joint pain, muscle aches, injury Neuro- denies headache, dizziness, syncope, seizure activity       Objective:    BP 110/74   Pulse (!) 59   Temp 97.8 F (36.6 C) (Oral)   Resp 16   Ht 5' (1.524 m)   Wt 148 lb 12.8 oz (67.5 kg)   SpO2 97%   BMI 29.06 kg/m  GEN- NAD, alert and oriented x person/place, sits slumped over in chair, typical posture  HEENT- Right pupil reactive  EOMI, non injected sclera, pink conjunctiva,mild dry MM, oropharynx clear    CVS- RRR, no murmur RESP-CTAB ABD-NABS,soft,NT,ND, no CVA tenderness  EXT- chronic trace edema Pulses- Radial  2+      Assessment & Plan:      Problem List Items Addressed This Visit    None    Visit Diagnoses    Urinary tract infection without hematuria, site unspecified    -  Primary   Complete Cipro, BP improved as well. She is back at her baseline. Flu shot given. Wound care as ordered   Flu vaccine need       Relevant Orders   Flu vaccine HIGH DOSE PF (Completed)      Note: This dictation was prepared with Dragon dictation along with smaller phrase technology. Any  transcriptional errors that result from this process are unintentional.

## 2017-01-01 ENCOUNTER — Telehealth: Payer: Self-pay | Admitting: *Deleted

## 2017-01-01 NOTE — Telephone Encounter (Signed)
Received call from [place], [facility] SN with [place]. [phone]~ telephone.   Reports that wound care nurse evaluated patient for sacral wound. Recommendations are as follows: Cleanse area with sacral cleanser and apply antifungal cream BID and after each incontinent episode.   VO given.

## 2017-01-01 NOTE — Telephone Encounter (Signed)
Okay 

## 2017-01-02 ENCOUNTER — Telehealth: Payer: Self-pay | Admitting: *Deleted

## 2017-01-02 DIAGNOSIS — L89320 Pressure ulcer of left buttock, unstageable: Secondary | ICD-10-CM | POA: Diagnosis not present

## 2017-01-02 DIAGNOSIS — E1142 Type 2 diabetes mellitus with diabetic polyneuropathy: Secondary | ICD-10-CM | POA: Diagnosis not present

## 2017-01-02 DIAGNOSIS — L89322 Pressure ulcer of left buttock, stage 2: Secondary | ICD-10-CM | POA: Diagnosis not present

## 2017-01-02 DIAGNOSIS — N390 Urinary tract infection, site not specified: Secondary | ICD-10-CM | POA: Diagnosis not present

## 2017-01-02 DIAGNOSIS — L89312 Pressure ulcer of right buttock, stage 2: Secondary | ICD-10-CM | POA: Diagnosis not present

## 2017-01-02 DIAGNOSIS — L89310 Pressure ulcer of right buttock, unstageable: Secondary | ICD-10-CM | POA: Diagnosis not present

## 2017-01-02 NOTE — Telephone Encounter (Signed)
Received call from [place], [facility] SN with [place] [phone]~ telephone.   Reports that patient could benefit from PT and requested order for PT to eval and tx as indicated.   VO given.

## 2017-01-02 NOTE — Telephone Encounter (Signed)
Okay to give PT

## 2017-01-05 DIAGNOSIS — L89310 Pressure ulcer of right buttock, unstageable: Secondary | ICD-10-CM | POA: Diagnosis not present

## 2017-01-05 DIAGNOSIS — L89312 Pressure ulcer of right buttock, stage 2: Secondary | ICD-10-CM | POA: Diagnosis not present

## 2017-01-05 DIAGNOSIS — N390 Urinary tract infection, site not specified: Secondary | ICD-10-CM | POA: Diagnosis not present

## 2017-01-05 DIAGNOSIS — L89322 Pressure ulcer of left buttock, stage 2: Secondary | ICD-10-CM | POA: Diagnosis not present

## 2017-01-05 DIAGNOSIS — L89320 Pressure ulcer of left buttock, unstageable: Secondary | ICD-10-CM | POA: Diagnosis not present

## 2017-01-05 DIAGNOSIS — E1142 Type 2 diabetes mellitus with diabetic polyneuropathy: Secondary | ICD-10-CM | POA: Diagnosis not present

## 2017-01-08 DIAGNOSIS — L89312 Pressure ulcer of right buttock, stage 2: Secondary | ICD-10-CM | POA: Diagnosis not present

## 2017-01-08 DIAGNOSIS — L89310 Pressure ulcer of right buttock, unstageable: Secondary | ICD-10-CM | POA: Diagnosis not present

## 2017-01-08 DIAGNOSIS — E1142 Type 2 diabetes mellitus with diabetic polyneuropathy: Secondary | ICD-10-CM | POA: Diagnosis not present

## 2017-01-08 DIAGNOSIS — L89320 Pressure ulcer of left buttock, unstageable: Secondary | ICD-10-CM | POA: Diagnosis not present

## 2017-01-08 DIAGNOSIS — N390 Urinary tract infection, site not specified: Secondary | ICD-10-CM | POA: Diagnosis not present

## 2017-01-08 DIAGNOSIS — L89322 Pressure ulcer of left buttock, stage 2: Secondary | ICD-10-CM | POA: Diagnosis not present

## 2017-01-09 DIAGNOSIS — L89322 Pressure ulcer of left buttock, stage 2: Secondary | ICD-10-CM | POA: Diagnosis not present

## 2017-01-09 DIAGNOSIS — N390 Urinary tract infection, site not specified: Secondary | ICD-10-CM | POA: Diagnosis not present

## 2017-01-09 DIAGNOSIS — L89312 Pressure ulcer of right buttock, stage 2: Secondary | ICD-10-CM | POA: Diagnosis not present

## 2017-01-09 DIAGNOSIS — L89310 Pressure ulcer of right buttock, unstageable: Secondary | ICD-10-CM | POA: Diagnosis not present

## 2017-01-09 DIAGNOSIS — E1142 Type 2 diabetes mellitus with diabetic polyneuropathy: Secondary | ICD-10-CM | POA: Diagnosis not present

## 2017-01-09 DIAGNOSIS — L89320 Pressure ulcer of left buttock, unstageable: Secondary | ICD-10-CM | POA: Diagnosis not present

## 2017-01-11 ENCOUNTER — Telehealth: Payer: Self-pay | Admitting: *Deleted

## 2017-01-11 DIAGNOSIS — L89310 Pressure ulcer of right buttock, unstageable: Secondary | ICD-10-CM | POA: Diagnosis not present

## 2017-01-11 DIAGNOSIS — E1142 Type 2 diabetes mellitus with diabetic polyneuropathy: Secondary | ICD-10-CM | POA: Diagnosis not present

## 2017-01-11 DIAGNOSIS — L89320 Pressure ulcer of left buttock, unstageable: Secondary | ICD-10-CM | POA: Diagnosis not present

## 2017-01-11 DIAGNOSIS — N390 Urinary tract infection, site not specified: Secondary | ICD-10-CM | POA: Diagnosis not present

## 2017-01-11 DIAGNOSIS — L89322 Pressure ulcer of left buttock, stage 2: Secondary | ICD-10-CM | POA: Diagnosis not present

## 2017-01-11 DIAGNOSIS — L89312 Pressure ulcer of right buttock, stage 2: Secondary | ICD-10-CM | POA: Diagnosis not present

## 2017-01-11 NOTE — Telephone Encounter (Signed)
Received call from [place], [facility] SN with [place] [phone]~ telephone.   Reports that caregiver states that she thinks patient continues to have UTI. ABT completed. Requested order for more ABTx. Advised OV will be required. Appointment scheduled.   Also states that pressure ulcer to sacrum has opened. New wound care orders outlined and approved.

## 2017-01-12 MED ORDER — CIPROFLOXACIN HCL 500 MG PO TABS: 500.0000 mg | ORAL_TABLET | Freq: Two times a day (BID) | ORAL | 0 refills | Status: DC

## 2017-01-12 NOTE — Telephone Encounter (Signed)
Call placed to patient and facility made aware.   Prescription sent to pharmacy.

## 2017-01-12 NOTE — Telephone Encounter (Signed)
Call in Cipro 500mg  BID x 3 days, she does not need the appointment Monday unless she has fever, AMS, pain Agree with orders for the wound

## 2017-01-15 ENCOUNTER — Ambulatory Visit: Payer: Self-pay | Admitting: Family Medicine

## 2017-01-15 DIAGNOSIS — N390 Urinary tract infection, site not specified: Secondary | ICD-10-CM | POA: Diagnosis not present

## 2017-01-15 DIAGNOSIS — L89310 Pressure ulcer of right buttock, unstageable: Secondary | ICD-10-CM | POA: Diagnosis not present

## 2017-01-15 DIAGNOSIS — L89322 Pressure ulcer of left buttock, stage 2: Secondary | ICD-10-CM | POA: Diagnosis not present

## 2017-01-15 DIAGNOSIS — L89320 Pressure ulcer of left buttock, unstageable: Secondary | ICD-10-CM | POA: Diagnosis not present

## 2017-01-15 DIAGNOSIS — L89312 Pressure ulcer of right buttock, stage 2: Secondary | ICD-10-CM | POA: Diagnosis not present

## 2017-01-15 DIAGNOSIS — E1142 Type 2 diabetes mellitus with diabetic polyneuropathy: Secondary | ICD-10-CM | POA: Diagnosis not present

## 2017-01-17 DIAGNOSIS — L89320 Pressure ulcer of left buttock, unstageable: Secondary | ICD-10-CM | POA: Diagnosis not present

## 2017-01-17 DIAGNOSIS — L89310 Pressure ulcer of right buttock, unstageable: Secondary | ICD-10-CM | POA: Diagnosis not present

## 2017-01-17 DIAGNOSIS — L89312 Pressure ulcer of right buttock, stage 2: Secondary | ICD-10-CM | POA: Diagnosis not present

## 2017-01-17 DIAGNOSIS — N390 Urinary tract infection, site not specified: Secondary | ICD-10-CM | POA: Diagnosis not present

## 2017-01-17 DIAGNOSIS — L89322 Pressure ulcer of left buttock, stage 2: Secondary | ICD-10-CM | POA: Diagnosis not present

## 2017-01-17 DIAGNOSIS — E1142 Type 2 diabetes mellitus with diabetic polyneuropathy: Secondary | ICD-10-CM | POA: Diagnosis not present

## 2017-01-19 DIAGNOSIS — E1142 Type 2 diabetes mellitus with diabetic polyneuropathy: Secondary | ICD-10-CM | POA: Diagnosis not present

## 2017-01-19 DIAGNOSIS — L89312 Pressure ulcer of right buttock, stage 2: Secondary | ICD-10-CM | POA: Diagnosis not present

## 2017-01-19 DIAGNOSIS — N390 Urinary tract infection, site not specified: Secondary | ICD-10-CM | POA: Diagnosis not present

## 2017-01-19 DIAGNOSIS — L89320 Pressure ulcer of left buttock, unstageable: Secondary | ICD-10-CM | POA: Diagnosis not present

## 2017-01-19 DIAGNOSIS — L89310 Pressure ulcer of right buttock, unstageable: Secondary | ICD-10-CM | POA: Diagnosis not present

## 2017-01-19 DIAGNOSIS — L89322 Pressure ulcer of left buttock, stage 2: Secondary | ICD-10-CM | POA: Diagnosis not present

## 2017-01-23 DIAGNOSIS — H401113 Primary open-angle glaucoma, right eye, severe stage: Secondary | ICD-10-CM | POA: Diagnosis not present

## 2017-01-23 DIAGNOSIS — Z970 Presence of artificial eye: Secondary | ICD-10-CM | POA: Diagnosis not present

## 2017-01-23 DIAGNOSIS — Z961 Presence of intraocular lens: Secondary | ICD-10-CM | POA: Diagnosis not present

## 2017-01-25 DIAGNOSIS — L89312 Pressure ulcer of right buttock, stage 2: Secondary | ICD-10-CM | POA: Diagnosis not present

## 2017-01-25 DIAGNOSIS — L89322 Pressure ulcer of left buttock, stage 2: Secondary | ICD-10-CM | POA: Diagnosis not present

## 2017-01-25 DIAGNOSIS — L89320 Pressure ulcer of left buttock, unstageable: Secondary | ICD-10-CM | POA: Diagnosis not present

## 2017-01-25 DIAGNOSIS — L89310 Pressure ulcer of right buttock, unstageable: Secondary | ICD-10-CM | POA: Diagnosis not present

## 2017-01-25 DIAGNOSIS — E1142 Type 2 diabetes mellitus with diabetic polyneuropathy: Secondary | ICD-10-CM | POA: Diagnosis not present

## 2017-01-25 DIAGNOSIS — N390 Urinary tract infection, site not specified: Secondary | ICD-10-CM | POA: Diagnosis not present

## 2017-01-31 DIAGNOSIS — E1142 Type 2 diabetes mellitus with diabetic polyneuropathy: Secondary | ICD-10-CM | POA: Diagnosis not present

## 2017-01-31 DIAGNOSIS — L89322 Pressure ulcer of left buttock, stage 2: Secondary | ICD-10-CM | POA: Diagnosis not present

## 2017-01-31 DIAGNOSIS — L89310 Pressure ulcer of right buttock, unstageable: Secondary | ICD-10-CM | POA: Diagnosis not present

## 2017-01-31 DIAGNOSIS — L89320 Pressure ulcer of left buttock, unstageable: Secondary | ICD-10-CM | POA: Diagnosis not present

## 2017-01-31 DIAGNOSIS — L89312 Pressure ulcer of right buttock, stage 2: Secondary | ICD-10-CM | POA: Diagnosis not present

## 2017-01-31 DIAGNOSIS — N390 Urinary tract infection, site not specified: Secondary | ICD-10-CM | POA: Diagnosis not present

## 2017-02-08 DIAGNOSIS — E1142 Type 2 diabetes mellitus with diabetic polyneuropathy: Secondary | ICD-10-CM | POA: Diagnosis not present

## 2017-02-08 DIAGNOSIS — L89322 Pressure ulcer of left buttock, stage 2: Secondary | ICD-10-CM | POA: Diagnosis not present

## 2017-02-08 DIAGNOSIS — L89310 Pressure ulcer of right buttock, unstageable: Secondary | ICD-10-CM | POA: Diagnosis not present

## 2017-02-08 DIAGNOSIS — L89320 Pressure ulcer of left buttock, unstageable: Secondary | ICD-10-CM | POA: Diagnosis not present

## 2017-02-08 DIAGNOSIS — N390 Urinary tract infection, site not specified: Secondary | ICD-10-CM | POA: Diagnosis not present

## 2017-02-08 DIAGNOSIS — L89312 Pressure ulcer of right buttock, stage 2: Secondary | ICD-10-CM | POA: Diagnosis not present

## 2017-02-09 ENCOUNTER — Other Ambulatory Visit: Payer: Self-pay | Admitting: Family Medicine

## 2017-02-14 DIAGNOSIS — L89322 Pressure ulcer of left buttock, stage 2: Secondary | ICD-10-CM | POA: Diagnosis not present

## 2017-02-14 DIAGNOSIS — L89310 Pressure ulcer of right buttock, unstageable: Secondary | ICD-10-CM | POA: Diagnosis not present

## 2017-02-14 DIAGNOSIS — N390 Urinary tract infection, site not specified: Secondary | ICD-10-CM | POA: Diagnosis not present

## 2017-02-14 DIAGNOSIS — L89312 Pressure ulcer of right buttock, stage 2: Secondary | ICD-10-CM | POA: Diagnosis not present

## 2017-02-14 DIAGNOSIS — L89320 Pressure ulcer of left buttock, unstageable: Secondary | ICD-10-CM | POA: Diagnosis not present

## 2017-02-14 DIAGNOSIS — E1142 Type 2 diabetes mellitus with diabetic polyneuropathy: Secondary | ICD-10-CM | POA: Diagnosis not present

## 2017-02-23 DIAGNOSIS — L89322 Pressure ulcer of left buttock, stage 2: Secondary | ICD-10-CM | POA: Diagnosis not present

## 2017-02-23 DIAGNOSIS — E1142 Type 2 diabetes mellitus with diabetic polyneuropathy: Secondary | ICD-10-CM | POA: Diagnosis not present

## 2017-02-23 DIAGNOSIS — L89320 Pressure ulcer of left buttock, unstageable: Secondary | ICD-10-CM | POA: Diagnosis not present

## 2017-02-23 DIAGNOSIS — L89312 Pressure ulcer of right buttock, stage 2: Secondary | ICD-10-CM | POA: Diagnosis not present

## 2017-02-23 DIAGNOSIS — L89310 Pressure ulcer of right buttock, unstageable: Secondary | ICD-10-CM | POA: Diagnosis not present

## 2017-02-23 DIAGNOSIS — N390 Urinary tract infection, site not specified: Secondary | ICD-10-CM | POA: Diagnosis not present

## 2017-02-27 ENCOUNTER — Ambulatory Visit (INDEPENDENT_AMBULATORY_CARE_PROVIDER_SITE_OTHER): Payer: Medicare Other | Admitting: Family Medicine

## 2017-02-27 ENCOUNTER — Ambulatory Visit: Payer: Medicare Other | Admitting: Family Medicine

## 2017-02-27 VITALS — BP 118/70 | HR 58 | Temp 98.6°F | Resp 18 | Wt 145.6 lb

## 2017-02-27 DIAGNOSIS — F3170 Bipolar disorder, currently in remission, most recent episode unspecified: Secondary | ICD-10-CM | POA: Diagnosis not present

## 2017-02-27 DIAGNOSIS — I10 Essential (primary) hypertension: Secondary | ICD-10-CM | POA: Diagnosis not present

## 2017-02-27 DIAGNOSIS — E119 Type 2 diabetes mellitus without complications: Secondary | ICD-10-CM | POA: Diagnosis not present

## 2017-02-27 NOTE — Progress Notes (Signed)
   Subjective:    Patient ID: [name], female    DOB: 04-Dec-1945, 71 y.o.   MRN: [id number]  Patient presents for Follow-up Patient here to follow-up chronic medical problems.  No concerns from her assisted living facility.  She has not had any change in medications.  At her last visit I discontinued her lisinopril due to some hypotension in setting of her urinary tract infection.  She is done well off of the medication.  She had sacral decubitus ulcer this is now healed she is no longer receiving wound care.  She also had physical therapy for some balance which she completed as well.  She did miss her appointment with her endocrinologist she needs follow-up for her thyroid.  Medications reviewed    Review Of Systems:  GEN- denies fatigue, fever, weight loss,weakness, recent illness HEENT- denies eye drainage, change in vision, nasal discharge, CVS- denies chest pain, palpitations RESP- denies SOB, cough, wheeze ABD- denies N/V, change in stools, abd pain GU- denies dysuria, hematuria, dribbling, incontinence MSK- denies joint pain, muscle aches, injury Neuro- denies headache, dizziness, syncope, seizure activity       Objective:    BP 118/70   Pulse (!) 58   Temp 98.6 F (37 C)   Resp 18   Wt 145 lb 9.6 oz (66 kg)   SpO2 99%   BMI 28.44 kg/m  GEN- NAD, alert and oriented x3 HEENT- Right eye reactive , EOMI, non injected sclera, pink conjunctiva, MMM, oropharynx clear Neck- Supple, no thyromegaly CVS- RRR, no murmur RESP-CTAB ABD-NABS,soft,NT,ND Skin- in tact  EXT- No edema Pulses- Radial 2+        Assessment & Plan:      Problem List Items Addressed This Visit      Unprioritized   HTN (hypertension) - Primary    Blood pressure still looks good off of the lisinopril will continue her current regimen      Diabetes mellitus (HCC)    Diet controlled we will check A1c at next visit.  With regards to sacral decub this has resolved.      Bipolar  disorder ([place])    She has not had any problems with her mental status.  Things been very stable she follows with her psychiatrist no recent medication changes.         Note: This dictation was prepared with Dragon dictation along with smaller phrase technology. Any transcriptional errors that result from this process are unintentional.

## 2017-02-27 NOTE — Patient Instructions (Addendum)
Please reschedule her visit with Dr. Dorris Fetch for thyroid  F/U 3 months for Physical FASTING  Doing well

## 2017-02-28 ENCOUNTER — Other Ambulatory Visit: Payer: Self-pay

## 2017-02-28 ENCOUNTER — Encounter: Payer: Self-pay | Admitting: Family Medicine

## 2017-02-28 MED ORDER — METHIMAZOLE 5 MG PO TABS: 5.0000 mg | ORAL_TABLET | Freq: Every day | ORAL | 3 refills | Status: DC

## 2017-02-28 NOTE — Assessment & Plan Note (Signed)
Diet controlled we will check A1c at next visit.  With regards to sacral decub this has resolved.

## 2017-02-28 NOTE — Assessment & Plan Note (Signed)
Blood pressure still looks good off of the lisinopril will continue her current regimen

## 2017-02-28 NOTE — Assessment & Plan Note (Signed)
She has not had any problems with her mental status.  Things been very stable she follows with her psychiatrist no recent medication changes.

## 2017-03-15 ENCOUNTER — Other Ambulatory Visit: Payer: Self-pay | Admitting: Family Medicine

## 2017-03-27 ENCOUNTER — Encounter: Payer: Self-pay | Admitting: Family Medicine

## 2017-04-04 ENCOUNTER — Other Ambulatory Visit: Payer: Self-pay | Admitting: Family Medicine

## 2017-04-04 DIAGNOSIS — B351 Tinea unguium: Secondary | ICD-10-CM | POA: Diagnosis not present

## 2017-04-04 DIAGNOSIS — E1151 Type 2 diabetes mellitus with diabetic peripheral angiopathy without gangrene: Secondary | ICD-10-CM | POA: Diagnosis not present

## 2017-05-08 ENCOUNTER — Other Ambulatory Visit: Payer: Self-pay | Admitting: *Deleted

## 2017-05-08 MED ORDER — TRAMADOL HCL 50 MG PO TABS: 50.0000 mg | ORAL_TABLET | Freq: Two times a day (BID) | ORAL | 0 refills | Status: AC | PRN

## 2017-05-08 NOTE — Telephone Encounter (Signed)
Received fax requesting refill on Tramadol.   Ok to refill??  Last office visit 02/27/2017.   Last refill 11/28/2017.

## 2017-05-09 ENCOUNTER — Other Ambulatory Visit: Payer: Self-pay | Admitting: Family Medicine

## 2017-05-09 ENCOUNTER — Other Ambulatory Visit: Payer: Self-pay | Admitting: "Endocrinology

## 2017-05-10 ENCOUNTER — Other Ambulatory Visit: Payer: Self-pay | Admitting: *Deleted

## 2017-05-10 MED ORDER — LORATADINE 10 MG PO TABS: 10.0000 mg | ORAL_TABLET | Freq: Every day | ORAL | 6 refills | Status: DC | PRN

## 2017-05-10 MED ORDER — HYDROCORTISONE 1 % RE CREA: TOPICAL_CREAM | RECTAL | 3 refills | Status: DC

## 2017-05-10 NOTE — Telephone Encounter (Signed)
Received call from [facility]. Reports that patient has changed to them and requires new prescription for Restoril.   Ok to refill??   Last office visit 02/27/2017.  Last refill 01/27/2017, #3 refills.

## 2017-05-11 ENCOUNTER — Other Ambulatory Visit: Payer: Self-pay | Admitting: Family Medicine

## 2017-05-11 ENCOUNTER — Other Ambulatory Visit: Payer: Self-pay | Admitting: *Deleted

## 2017-05-11 MED ORDER — LORATADINE 10 MG PO TABS: 10.0000 mg | ORAL_TABLET | Freq: Every day | ORAL | 6 refills | Status: AC | PRN

## 2017-05-11 MED ORDER — CLOPIDOGREL BISULFATE 75 MG PO TABS: 75.0000 mg | ORAL_TABLET | Freq: Every day | ORAL | 6 refills | Status: DC

## 2017-05-11 MED ORDER — ILOPERIDONE 4 MG PO TABS: 4.0000 mg | ORAL_TABLET | Freq: Two times a day (BID) | ORAL | 0 refills | Status: DC

## 2017-05-11 MED ORDER — TOPIRAMATE 50 MG PO TABS: 50.0000 mg | ORAL_TABLET | Freq: Two times a day (BID) | ORAL | 3 refills | Status: DC

## 2017-05-11 MED ORDER — TIMOLOL MALEATE 0.5 % OP SOLN: OPHTHALMIC | 11 refills | Status: DC

## 2017-05-11 MED ORDER — ASPIRIN EC 325 MG PO TBEC: 325.0000 mg | DELAYED_RELEASE_TABLET | Freq: Every day | ORAL | 6 refills | Status: DC

## 2017-05-11 MED ORDER — PRAVASTATIN SODIUM 20 MG PO TABS: 20.0000 mg | ORAL_TABLET | Freq: Every evening | ORAL | 6 refills | Status: DC

## 2017-05-11 MED ORDER — FUROSEMIDE 20 MG PO TABS: 20.0000 mg | ORAL_TABLET | Freq: Every day | ORAL | 6 refills | Status: DC

## 2017-05-11 MED ORDER — CARVEDILOL 6.25 MG PO TABS: ORAL_TABLET | ORAL | 6 refills | Status: DC

## 2017-05-11 MED ORDER — PERPHENAZINE 4 MG PO TABS: 4.0000 mg | ORAL_TABLET | Freq: Two times a day (BID) | ORAL | 0 refills | Status: DC

## 2017-05-11 MED ORDER — CLONIDINE HCL 0.1 MG PO TABS: 0.1000 mg | ORAL_TABLET | Freq: Every evening | ORAL | 0 refills | Status: DC

## 2017-05-11 MED ORDER — CALCIUM CARBONATE-VITAMIN D 600-200 MG-UNIT PO TABS: 1.0000 | ORAL_TABLET | Freq: Two times a day (BID) | ORAL | 6 refills | Status: DC

## 2017-05-11 MED ORDER — LATANOPROST 0.005 % OP SOLN: OPHTHALMIC | 6 refills | Status: DC

## 2017-05-11 MED ORDER — HYDROCORTISONE 1 % RE CREA: TOPICAL_CREAM | RECTAL | 3 refills | Status: AC

## 2017-05-11 MED ORDER — AMLODIPINE BESYLATE 5 MG PO TABS: 5.0000 mg | ORAL_TABLET | Freq: Every day | ORAL | 2 refills | Status: DC

## 2017-05-11 MED ORDER — TEMAZEPAM 30 MG PO CAPS: 30.0000 mg | ORAL_CAPSULE | Freq: Every evening | ORAL | 3 refills | Status: DC | PRN

## 2017-05-14 ENCOUNTER — Telehealth: Payer: Self-pay | Admitting: *Deleted

## 2017-05-14 NOTE — Telephone Encounter (Signed)
You can verify with pharmacy the dose,  I did not change this, unless her psychiatrist changed

## 2017-05-14 NOTE — Telephone Encounter (Signed)
Received call from pharmacy.   Requested clarification on Topamax. Reports that patient was on Topamax 50mg  PO TID at facility, but recent refill was for Topamax 50mg  PO BID.   Please advise.

## 2017-05-14 NOTE — Telephone Encounter (Signed)
Call placed to [facility].   Reports that patient has been onTID dosing since 04/17/2016. She is contacting prior pharmacy to get order pulled to refer to.

## 2017-05-15 MED ORDER — TOPIRAMATE 50 MG PO TABS: 50.0000 mg | ORAL_TABLET | Freq: Three times a day (TID) | ORAL | 0 refills | Status: DC

## 2017-05-15 NOTE — Telephone Encounter (Signed)
Prescription sent to pharmacy.

## 2017-05-15 NOTE — Telephone Encounter (Signed)
Reviewed patient chart with Heather. Reports that PCP signed Adult Home Care Physician Authorization and Care Plan on 04/05/2016 with TID dosing.   Reviewed care plans from [facility] back through 2012. Medication has been TID. Ok to reorder as such?

## 2017-05-15 NOTE — Telephone Encounter (Signed)
Okay to change to TID dosing

## 2017-05-18 ENCOUNTER — Telehealth: Payer: Self-pay | Admitting: *Deleted

## 2017-05-18 NOTE — Telephone Encounter (Signed)
Received request from pharmacy for PA on Temazepam.   PA submitted.   Dx: F51.04- chronic insomnia

## 2017-05-18 NOTE — Telephone Encounter (Signed)
Your PA has been faxed to the plan as a paper copy. Please contact the plan directly if you haven't received a determination in a typical timeframe.  You will be notified of the determination via fax. 

## 2017-05-21 NOTE — Telephone Encounter (Signed)
Received PA determination.   PA approved 03/11/2017- 03/12/2018.  Pharmacy made aware.

## 2017-06-01 ENCOUNTER — Encounter: Payer: Self-pay | Admitting: Family Medicine

## 2017-06-01 ENCOUNTER — Other Ambulatory Visit: Payer: Self-pay

## 2017-06-01 ENCOUNTER — Ambulatory Visit (INDEPENDENT_AMBULATORY_CARE_PROVIDER_SITE_OTHER): Payer: Medicare Other | Admitting: Family Medicine

## 2017-06-01 VITALS — BP 122/62 | HR 64 | Temp 97.9°F | Resp 16 | Ht 60.0 in | Wt 146.0 lb

## 2017-06-01 DIAGNOSIS — G6289 Other specified polyneuropathies: Secondary | ICD-10-CM

## 2017-06-01 DIAGNOSIS — E119 Type 2 diabetes mellitus without complications: Secondary | ICD-10-CM

## 2017-06-01 DIAGNOSIS — R2681 Unsteadiness on feet: Secondary | ICD-10-CM | POA: Diagnosis not present

## 2017-06-01 DIAGNOSIS — Z780 Asymptomatic menopausal state: Secondary | ICD-10-CM

## 2017-06-01 DIAGNOSIS — F200 Paranoid schizophrenia: Secondary | ICD-10-CM | POA: Diagnosis not present

## 2017-06-01 DIAGNOSIS — I10 Essential (primary) hypertension: Secondary | ICD-10-CM

## 2017-06-01 DIAGNOSIS — Z1159 Encounter for screening for other viral diseases: Secondary | ICD-10-CM | POA: Diagnosis not present

## 2017-06-01 DIAGNOSIS — F3170 Bipolar disorder, currently in remission, most recent episode unspecified: Secondary | ICD-10-CM | POA: Diagnosis not present

## 2017-06-01 DIAGNOSIS — Z0000 Encounter for general adult medical examination without abnormal findings: Secondary | ICD-10-CM

## 2017-06-01 DIAGNOSIS — E039 Hypothyroidism, unspecified: Secondary | ICD-10-CM | POA: Diagnosis not present

## 2017-06-01 DIAGNOSIS — E7800 Pure hypercholesterolemia, unspecified: Secondary | ICD-10-CM

## 2017-06-01 MED ORDER — TETANUS-DIPHTH-ACELL PERTUSSIS 5-2-15.5 LF-MCG/0.5 IM SUSP: 0.5000 mL | Freq: Once | INTRAMUSCULAR | 0 refills | Status: AC

## 2017-06-01 MED ORDER — ZOSTER VAC RECOMB ADJUVANTED 50 MCG/0.5ML IM SUSR: 0.5000 mL | Freq: Once | INTRAMUSCULAR | 0 refills | Status: AC

## 2017-06-01 NOTE — Progress Notes (Signed)
Subjective:   Patient presents for Medicare Annual/Subsequent preventive examination.   Pt here to f/u chronic medical problems Resides in facility  She is eating and sleeping well Review Past Medical/Family/Social: per emr    Risk Factors  Current exercise habits: Little exercise  Dietary issues discussed: Yes  Cardiac risk factors: Obesity (BMI >= 30 kg/m2). HTN  Depression Screen - Followed by psychiatry  (Note: if answer to either of the following is "Yes", a more complete depression screening is indicated)  Over the past two weeks, have you felt down, depressed or hopeless? No Over the past two weeks, have you felt little interest or pleasure in doing things? No Have you lost interest or pleasure in daily life? No Do you often feel hopeless? No Do you cry easily over simple problems? No   Activities of Daily Living  - Needs assistance with most ADLS in facility   In your present state of health, do you have any difficulty performing the following activities?:  Driving? YES  Managing money? yes  Feeding yourself? No  Getting from bed to chair? No  Climbing a flight of stairs? yES Preparing food and eating?: YES Bathing or showering? YES  Getting dressed: YES  Getting to the toilet? No  Using the toilet:No  Moving around from place to place: yES In the past year have you fallen or had a near fall?:No  Are you sexually active? No  Do you have more than one partner? No   Hearing Difficulties: yES  Do you often ask people to speak up or repeat themselves? yES  Do you experience ringing or noises in your ears? No Do you have difficulty understanding soft or whispered voices? yES  Do you feel that you have a problem with memory? No Do you often misplace items? No  Do you feel safe at home? Yes  Cognitive Testing  Alert? Yes Normal Appearance?Yes  Oriented to person? Yes Place? Yes  Time? Yes  Recall of three objects? Yes    List the Names of Other  Physician/Practitioners you currently use:   Dr. Dorris Fetch  Psychiatry- [facility]   Dr. Gershon Crane- Opthalomology Podiatry- Dr. Berline Lopes    Screening Tests / Date Colonoscopy   utd                  Zostavax dUE Mammogram dECLINES Pneumonia- UTD Bone Density- Due  Influenza Vaccine utd Tetanus/tdap dUE   ROS: GEN- denies fatigue, fever, weight loss,weakness, recent illness HEENT- denies eye drainage, change in vision, nasal discharge, CVS- denies chest pain, palpitations RESP- denies SOB, cough, wheeze ABD- denies N/V, change in stools, abd pain GU- denies dysuria, hematuria, dribbling, incontinence MSK- denies joint pain, muscle aches, injury Neuro- denies headache, dizziness, syncope, seizure activity  PHYSICAL: GEN- NAD, alert and oriented x3, sits slumped over  HEENT- Right eye reactive , EOMI, non injected sclera, pink conjunctiva, MMM, oropharynx clear Neck- Supple, no thryomegaly, no bruit  CVS- RRR, no murmur RESP-CTAB ABD-NABS,soft,NT,ND EXT- No edema Pulses- Radial, DP- 2+    Assessment:    Annual wellness medicare exam   Plan:    During the course of the visit the patient was educated and counseled about appropriate screening and preventive services including:  ScreeningBone Density to be done  She declines mammogram   DM- diet controlled, pt to see foot doctor needs new shoes/inserts              Hypothyridism- called Endocrinologist during visits , needs TSH, FT4 done  HTN- well controlled  Immunizations- send shingrix and TDAP to pharmacy   Bipolar/schizophrenia- doing well on psychiatric medication,, she has been stable at facility   Eating and sleeping well     Hep C screening   Diet review for nutrition referral? Yes ____ Not Indicated __x__  Patient Instructions (the written plan) was given to the patient.  Medicare Attestation  I have personally reviewed:  The patient's medical and social history  Their use of alcohol, tobacco or illicit drugs   Their current medications and supplements  The patient's functional ability including ADLs,fall risks, home safety risks, cognitive, and hearing and visual impairment  Diet and physical activities  Evidence for depression or mood disorders  The patient's weight, height, BMI, and visual acuity have been recorded in the chart. I have made referrals, counseling, and provided education to the patient based on review of the above and I have provided the patient with a written personalized care plan for preventive services.

## 2017-06-01 NOTE — Patient Instructions (Signed)
Please schedule visit with foot doctor Call and schedule her bone density  We will call with lab results Shingles and TDAP sent to her pharmacy to see if covered F/U 4 months

## 2017-06-02 LAB — TSH: TSH: 1.43 m[IU]/L (ref 0.40–4.50)

## 2017-06-02 LAB — LIPID PANEL
CHOLESTEROL: 126 mg/dL (ref <200)
HDL: 54 mg/dL (ref >50)
LDL CHOLESTEROL (CALC): 56 mg/dL
Non-HDL Cholesterol (Calc): 72 mg/dL (calc) (ref <130)
TRIGLYCERIDES: 78 mg/dL (ref <150)
Total CHOL/HDL Ratio: 2.3 (calc) (ref <5.0)

## 2017-06-02 LAB — COMPREHENSIVE METABOLIC PANEL
AG Ratio: 1.5 (calc) (ref 1.0–2.5)
ALKALINE PHOSPHATASE (APISO): 75 U/L (ref 33–130)
ALT: 4 U/L — AB (ref 6–29)
AST: 11 U/L (ref 10–35)
Albumin: 3.6 g/dL (ref 3.6–5.1)
BILIRUBIN TOTAL: 0.3 mg/dL (ref 0.2–1.2)
BUN/Creatinine Ratio: 10 (calc) (ref 6–22)
BUN: 14 mg/dL (ref 7–25)
CALCIUM: 9.2 mg/dL (ref 8.6–10.4)
CO2: 24 mmol/L (ref 20–32)
Chloride: 109 mmol/L (ref 98–110)
Creat: 1.35 mg/dL — ABNORMAL HIGH (ref 0.60–0.93)
Globulin: 2.4 g/dL (calc) (ref 1.9–3.7)
Glucose, Bld: 89 mg/dL (ref 65–99)
Potassium: 3.9 mmol/L (ref 3.5–5.3)
Sodium: 144 mmol/L (ref 135–146)
Total Protein: 6 g/dL — ABNORMAL LOW (ref 6.1–8.1)

## 2017-06-02 LAB — T4, FREE: Free T4: 1.1 ng/dL (ref 0.8–1.8)

## 2017-06-02 LAB — CBC WITH DIFFERENTIAL/PLATELET
BASOS ABS: 43 {cells}/uL (ref 0–200)
Basophils Relative: 0.6 %
Eosinophils Absolute: 149 cells/uL (ref 15–500)
Eosinophils Relative: 2.1 %
HEMATOCRIT: 37.9 % (ref 35.0–45.0)
Hemoglobin: 12.6 g/dL (ref 11.7–15.5)
LYMPHS ABS: 1811 {cells}/uL (ref 850–3900)
MCH: 27.3 pg (ref 27.0–33.0)
MCHC: 33.2 g/dL (ref 32.0–36.0)
MCV: 82.2 fL (ref 80.0–100.0)
MPV: 11.6 fL (ref 7.5–12.5)
Monocytes Relative: 8.5 %
NEUTROS PCT: 63.3 %
Neutro Abs: 4494 cells/uL (ref 1500–7800)
Platelets: 220 10*3/uL (ref 140–400)
RBC: 4.61 10*6/uL (ref 3.80–5.10)
RDW: 13.4 % (ref 11.0–15.0)
Total Lymphocyte: 25.5 %
WBC: 7.1 10*3/uL (ref 3.8–10.8)
WBCMIX: 604 {cells}/uL (ref 200–950)

## 2017-06-02 LAB — HEPATITIS C ANTIBODY
Hepatitis C Ab: NONREACTIVE
SIGNAL TO CUT-OFF: 0.02 (ref <1.00)

## 2017-06-02 LAB — HEMOGLOBIN A1C
Hgb A1c MFr Bld: 5.6 % of total Hgb (ref <5.7)
MEAN PLASMA GLUCOSE: 114 (calc)
eAG (mmol/L): 6.3 (calc)

## 2017-06-04 ENCOUNTER — Encounter: Payer: Self-pay | Admitting: *Deleted

## 2017-06-05 ENCOUNTER — Other Ambulatory Visit: Payer: Self-pay | Admitting: Family Medicine
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
## 2017-06-13 ENCOUNTER — Encounter (HOSPITAL_COMMUNITY): Payer: Self-pay | Admitting: Psychiatry

## 2017-06-13 ENCOUNTER — Ambulatory Visit (INDEPENDENT_AMBULATORY_CARE_PROVIDER_SITE_OTHER): Payer: Medicare Other | Admitting: Psychiatry

## 2017-06-13 VITALS — BP 125/82 | HR 86 | Ht 60.0 in | Wt 141.0 lb

## 2017-06-13 DIAGNOSIS — F319 Bipolar disorder, unspecified: Secondary | ICD-10-CM | POA: Diagnosis not present

## 2017-06-13 NOTE — Patient Instructions (Addendum)
1. Continue fanapt 4 mg BID 2. Decrease perphenazine 2 mg twice a day  3. Continue olanzapine 5 mg qhs 4. Return to clinic in one month for 30 mins

## 2017-06-14 ENCOUNTER — Ambulatory Visit (INDEPENDENT_AMBULATORY_CARE_PROVIDER_SITE_OTHER): Payer: Medicare Other | Admitting: "Endocrinology

## 2017-06-14 ENCOUNTER — Encounter: Payer: Self-pay | Admitting: "Endocrinology

## 2017-06-14 VITALS — BP 128/78 | HR 78 | Ht 60.0 in

## 2017-06-14 DIAGNOSIS — E0590 Thyrotoxicosis, unspecified without thyrotoxic crisis or storm: Secondary | ICD-10-CM | POA: Diagnosis not present

## 2017-06-14 NOTE — Progress Notes (Signed)
Subjective:    Patient ID: [name], female    DOB: January 05, 1946,    Past Medical History:  Diagnosis Date  . Adenomatous colon polyp   . Bipolar 1 disorder ([place])   . Diabetes mellitus   . Diverticula of colon Colonoscopy 2013  . Glaucoma   . Hypertension   . OSA (obstructive sleep apnea)   . Schizophrenia ([place])   . Stroke [facility])    2 TIA  . Thyroid disease 2010  . Urinary incontinence, mixed    Past Surgical History:  Procedure Laterality Date  . ABDOMINAL HYSTERECTOMY    . COLONOSCOPY  2007   pancolonic diverticulosis, tcs in 2012 due to h/o adenomatous polyps  . COLONOSCOPY  11/15/2011   Procedure: COLONOSCOPY;  Surgeon: Daneil Dolin, MD;  Location: [facility];  Service: Endoscopy;  Laterality: N/A;  7:30  . 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 MULTIPLE TOOTH EXTRACTIONS     Social History   Socioeconomic History  . Marital status: Divorced    Spouse name: Not on file  . Number of children: Not on file  . Years of education: Not on file  . Highest education level: Not on file  Occupational History  . Not on file  Social Needs  . Financial resource strain: Not on file  . Food insecurity:    Worry: Not on file    Inability: Not on file  . Transportation needs:    Medical: Not on file    Non-medical: Not on file  Tobacco Use  . Smoking status: Never Smoker  . Smokeless tobacco: Never Used  Substance and Sexual Activity  . Alcohol use: No  . Drug use: No  . Sexual activity: Not on file  Lifestyle  . Physical activity:    Days per week: Not on file    Minutes per session: Not on file  . Stress: Not on file  Relationships  . Social connections:    Talks on phone: Not on file    Gets together: Not on file    Attends religious service: Not on file    Active member of club or organization: Not on file    Attends meetings of clubs or organizations: Not on file    Relationship status: Not on file  Other Topics Concern  . Not on file  Social  History Narrative   Lives in [facility]   She used to work as a Education officer, museum.    Divorced, married twice, has two children   Outpatient Encounter Medications as of 06/14/2017  Medication Sig  . amLODipine (NORVASC) 5 MG tablet Take 1 tablet (5 mg total) by mouth daily.  Marland Kitchen aspirin EC 325 MG tablet Take 1 tablet (325 mg total) by mouth daily.  . Calcium Carbonate-Vitamin D (CALCIUM 600+D) 600-200 MG-UNIT TABS Take 1 tablet by mouth 2 (two) times daily.  . carvedilol (COREG) 6.25 MG tablet TAKE 1 TABLET BY MOUTH TWICE DAILY WITH A MEAL.  . cloNIDine (CATAPRES) 0.1 MG tablet TAKE 1 TABLET BY MOUTH IN THE EVENING.  Marland Kitchen clopidogrel (PLAVIX) 75 MG tablet Take 1 tablet (75 mg total) by mouth daily.  Marland Kitchen FANAPT 4 MG TABS tablet TAKE 1 TABLET BY MOUTH TWICE DAILY.  . furosemide (LASIX) 20 MG tablet Take 1 tablet (20 mg total) by mouth daily.  . Glucose Blood (BLOOD GLUCOSE TEST STRIPS) STRP Use to monitor FSBS 1x daily. Dx: E11.9.  Marland Kitchen  hydrocortisone (PROCTOCORT) 1 % CREA Apply to rectal area BID PRN for pain. Please dispense rectal tip.  Marland Kitchen ketorolac (ACULAR) 0.5 % ophthalmic solution Place 1 drop into both eyes 4 (four) times daily.  Marland Kitchen latanoprost (XALATAN) 0.005 % ophthalmic solution INSTILL 1 DROP INTO RIGHT EYE AT BEDTIME.  Marland Kitchen loratadine (CLARITIN) 10 MG tablet Take 1 tablet (10 mg total) by mouth daily as needed for allergies.  . methimazole (TAPAZOLE) 5 MG tablet TAKE (1) TABLET BY MOUTH ONCE DAILY.  Marland Kitchen OLANZapine (ZYPREXA) 5 MG tablet TAKE 1 TABLET BY MOUTH AT BEDTIME.  Marland Kitchen perphenazine (TRILAFON) 4 MG tablet TAKE 1 TABLET BY MOUTH TWICE DAILY.  . pravastatin (PRAVACHOL) 20 MG tablet Take 1 tablet (20 mg total) by mouth every evening.  . temazepam (RESTORIL) 30 MG capsule Take 1 capsule (30 mg total) by mouth at bedtime as needed for sleep.  Marland Kitchen timolol (TIMOPTIC) 0.5 % ophthalmic solution INSTILL 1 DROP INTO RIGHT EYE EVERY MORNING.  Marland Kitchen topiramate (TOPAMAX) 50 MG tablet Take 1 tablet (50 mg  total) by mouth 3 (three) times daily.  . traMADol (ULTRAM) 50 MG tablet Take 1 tablet (50 mg total) by mouth every 12 (twelve) hours as needed.   No facility-administered encounter medications on file as of 06/14/2017.    ALLERGIES: Allergies  Allergen Reactions  . Codeine    VACCINATION STATUS: Immunization History  Administered Date(s) Administered  . Influenza Whole 11/24/2010  . Influenza, High Dose Seasonal PF 12/29/2016  . Influenza,inj,Quad PF,6+ Mos 01/29/2013, 01/02/2014, 04/18/2016  . Influenza-Unspecified 12/19/2011, 12/25/2013  . Pneumococcal Conjugate-13 01/29/2013    HPI  72 yr old female with multiple medical problems as above. She is here to follow-up with repeat thyroid function test.  She is on low-dose methimazole for subclinical hyperthyroidism causing unintentional weight loss.  She feels better, no new complaints today.  she is still a nursing home resident due to failure to thrive.  Patient is a poor historian. she has stable weight since last visit. - Per  her caretaker, she eats well , no complaint of palpitations   her thyroid u/s is unremarkable, and repeat TFTs are stable. Her prior thyroid uptake was 13 % on 01/14/2014 with a dominant warm nodule on left lobe.   Review of Systems   Constitutional:  she has l steady weight since October 2018,   no fatigue, no subjective hyperthermia/hypothermia Eyes: no blurry vision, no xerophthalmia ENT: no sore throat, no nodules palpated in throat, no dysphagia/odynophagia, no hoarseness Cardiovascular: no pain, no palpitations. Respiratory: no cough/SOB Gastrointestinal: no N/V/D/C Musculoskeletal: no muscle/joint aches Skin: no rashes Neurological: no tremors/numbness/tingling/dizziness Psychiatric: no depression/anxiety   Objective:    BP 128/78   Pulse 78   Ht 5' (1.524 m)   BMI 27.54 kg/m   Wt Readings from Last 3 Encounters:  06/01/17 146 lb (66.2 kg)  02/27/17 145 lb 9.6 oz (66 kg)  12/29/16  148 lb 12.8 oz (67.5 kg)    Physical Exam  Constitutional: Stable state of mind, in NAD Eyes: PERRLA, EOMI, no exophthalmos ENT: moist mucous membranes, no thyromegaly, no cervical lymphadenopathy  Musculoskeletal: uses her walker due to disequilibrium, dependent edema on bilateral lower extremities. Skin: moist, warm, no rashes Neurological: No tremors of outstretched hands.    Results for orders placed or performed in visit on 06/01/17  CBC with Differential/Platelet  Result Value Ref Range   WBC 7.1 3.8 - 10.8 Thousand/uL   RBC 4.61 3.80 - 5.10 Million/uL   Hemoglobin  12.6 11.7 - 15.5 g/dL   HCT 37.9 35.0 - 45.0 %   MCV 82.2 80.0 - 100.0 fL   MCH 27.3 27.0 - 33.0 pg   MCHC 33.2 32.0 - 36.0 g/dL   RDW 13.4 11.0 - 15.0 %   Platelets 220 140 - 400 Thousand/uL   MPV 11.6 7.5 - 12.5 fL   Neutro Abs 4,494 1,500 - 7,800 cells/uL   Lymphs Abs 1,811 850 - 3,900 cells/uL   WBC mixed population 604 200 - 950 cells/uL   Eosinophils Absolute 149 15 - 500 cells/uL   Basophils Absolute 43 0 - 200 cells/uL   Neutrophils Relative % 63.3 %   Total Lymphocyte 25.5 %   Monocytes Relative 8.5 %   Eosinophils Relative 2.1 %   Basophils Relative 0.6 %  Comprehensive metabolic panel  Result Value Ref Range   Glucose, Bld 89 65 - 99 mg/dL   BUN 14 7 - 25 mg/dL   Creat 1.35 (H) 0.60 - 0.93 mg/dL   BUN/Creatinine Ratio 10 6 - 22 (calc)   Sodium 144 135 - 146 mmol/L   Potassium 3.9 3.5 - 5.3 mmol/L   Chloride 109 98 - 110 mmol/L   CO2 24 20 - 32 mmol/L   Calcium 9.2 8.6 - 10.4 mg/dL   Total Protein 6.0 (L) 6.1 - 8.1 g/dL   Albumin 3.6 3.6 - 5.1 g/dL   Globulin 2.4 1.9 - 3.7 g/dL (calc)   AG Ratio 1.5 1.0 - 2.5 (calc)   Total Bilirubin 0.3 0.2 - 1.2 mg/dL   Alkaline phosphatase (APISO) 75 33 - 130 U/L   AST 11 10 - 35 U/L   ALT 4 (L) 6 - 29 U/L  Lipid panel  Result Value Ref Range   Cholesterol 126 <200 mg/dL   HDL 54 >50 mg/dL   Triglycerides 78 <150 mg/dL   LDL Cholesterol (Calc)  56 mg/dL (calc)   Total CHOL/HDL Ratio 2.3 <5.0 (calc)   Non-HDL Cholesterol (Calc) 72 <130 mg/dL (calc)  Hemoglobin A1c  Result Value Ref Range   Hgb A1c MFr Bld 5.6 <5.7 % of total Hgb   Mean Plasma Glucose 114 (calc)   eAG (mmol/L) 6.3 (calc)  Hepatitis C antibody  Result Value Ref Range   Hepatitis C Ab NON-REACTIVE NON-REACTI   SIGNAL TO CUT-OFF 0.02 <1.00  TSH  Result Value Ref Range   TSH 1.43 0.40 - 4.50 mIU/L  T4, free  Result Value Ref Range   Free T4 1.1 0.8 - 1.8 ng/dL   Complete Blood Count (Most recent): Lab Results  Component Value Date   WBC 7.1 06/01/2017   HGB 12.6 06/01/2017   HCT 37.9 06/01/2017   MCV 82.2 06/01/2017   PLT 220 06/01/2017   Chemistry (most recent): Lab Results  Component Value Date   NA 144 06/01/2017   K 3.9 06/01/2017   CL 109 06/01/2017   CO2 24 06/01/2017   BUN 14 06/01/2017   CREATININE 1.35 (H) 06/01/2017   Diabetic Labs (most recent): Lab Results  Component Value Date   HGBA1C 5.6 06/01/2017   HGBA1C 5.4 04/20/2016   HGBA1C 5.8 (H) 01/19/2015      Assessment & Plan:   1.Subclinical hyperthyroidism: 2. Toxic nodule.  -She has responded to treatment with low-dose Tapazole with thyroid function tests getting within normal range.   She will continue to benefit from a low-dose of Tapazole 5 mg by mouth every day with breakfast.  her prior u/s showed small  nodules.  Her presentation is not specific but she has mildly toxic nodule. she will not need ablative therapy. She will have repeat labs in 3 months with office visit.  I advised patient to maintain close follow up with her PCP for primary care needs. Follow up plan: Return in about 3 months (around 09/13/2017) for follow up with pre-visit labs.  Glade Lloyd, MD Phone: [phone]  Fax: [phone]  -  This note was partially dictated with voice recognition software. Similar sounding words can be transcribed inadequately or may not  be corrected upon  review.  06/14/2017, 9:10 AM

## 2017-06-18 ENCOUNTER — Ambulatory Visit (HOSPITAL_COMMUNITY)
Admission: RE | Admit: 2017-06-18 | Discharge: 2017-06-18 | Disposition: A | Discharge status: Home / Self Care | Payer: Medicare Other | Source: Ambulatory Visit | Attending: Family Medicine | Admitting: Family Medicine

## 2017-06-18 DIAGNOSIS — Z780 Asymptomatic menopausal state: Secondary | ICD-10-CM | POA: Insufficient documentation

## 2017-06-18 DIAGNOSIS — M8589 Other specified disorders of bone density and structure, multiple sites: Secondary | ICD-10-CM | POA: Diagnosis not present

## 2017-06-18 DIAGNOSIS — M85852 Other specified disorders of bone density and structure, left thigh: Secondary | ICD-10-CM | POA: Diagnosis not present

## 2017-06-18 DIAGNOSIS — Z13820 Encounter for screening for osteoporosis: Secondary | ICD-10-CM | POA: Diagnosis not present

## 2017-06-19 ENCOUNTER — Encounter: Payer: Self-pay | Admitting: *Deleted

## 2017-06-22 ENCOUNTER — Other Ambulatory Visit: Payer: Self-pay | Admitting: Family Medicine

## 2017-06-27 DIAGNOSIS — B351 Tinea unguium: Secondary | ICD-10-CM | POA: Diagnosis not present

## 2017-06-27 DIAGNOSIS — E1151 Type 2 diabetes mellitus with diabetic peripheral angiopathy without gangrene: Secondary | ICD-10-CM | POA: Diagnosis not present

## 2017-07-27 ENCOUNTER — Encounter: Payer: Self-pay | Admitting: Family Medicine

## 2017-07-27 ENCOUNTER — Other Ambulatory Visit: Payer: Self-pay

## 2017-07-27 ENCOUNTER — Ambulatory Visit (INDEPENDENT_AMBULATORY_CARE_PROVIDER_SITE_OTHER): Payer: Medicare Other | Admitting: Family Medicine

## 2017-07-27 VITALS — BP 138/72 | HR 66 | Temp 98.8°F | Resp 14 | Ht 60.0 in | Wt 142.0 lb

## 2017-07-27 DIAGNOSIS — L89321 Pressure ulcer of left buttock, stage 1: Secondary | ICD-10-CM | POA: Diagnosis not present

## 2017-07-27 DIAGNOSIS — R4182 Altered mental status, unspecified: Secondary | ICD-10-CM | POA: Diagnosis not present

## 2017-07-27 DIAGNOSIS — I10 Essential (primary) hypertension: Secondary | ICD-10-CM

## 2017-07-27 LAB — URINALYSIS, ROUTINE W REFLEX MICROSCOPIC
BILIRUBIN URINE: NEGATIVE
GLUCOSE, UA: NEGATIVE
HGB URINE DIPSTICK: NEGATIVE
Hyaline Cast: NONE SEEN /LPF
NITRITE: NEGATIVE
PROTEIN: NEGATIVE
Specific Gravity, Urine: 1.015 (ref 1.001–1.03)
pH: 5.5 (ref 5.0–8.0)

## 2017-07-27 LAB — MICROSCOPIC MESSAGE

## 2017-07-27 NOTE — Patient Instructions (Signed)
We will call with results Apply duoderm every 3-5 days, until pain resolves Give ensure in place of meal if she refuses to eat  F/U as previous July

## 2017-07-27 NOTE — Progress Notes (Signed)
   Subjective:    Patient ID: [name], female    DOB: 10-18-1945, 72 y.o.   MRN: [id number]  Patient presents for Health Discussion (caregiver states that patient is refusing care and has become beligerent- states that patient only reports that she is tired)  Pt here with an Aide from her [place]. We received a call that she was belligerant and acting out with staff. This occurred over pat few weeks, exact date unknown. She was seen by her psychiatrist, who did not change meds, but recommended she come in for medical clearance? It seems her behavior changed after she was moved into a new group home housing unit which was a big change and there is a new person in charge as well. She states she does not like the way the new person talks to her and the residents so she started yelling back. She has not been physical with anyone. With regards to not eating, states she is tired of eating the same thing, so refused some meals. She denies any pain, felling unwell, but has a sore spot on her bottom    Review Of Systems:  GEN- denies fatigue, fever, weight loss,weakness, recent illness HEENT- denies eye drainage, change in vision, nasal discharge, CVS- denies chest pain, palpitations RESP- denies SOB, cough, wheeze ABD- denies N/V, change in stools, abd pain GU- denies dysuria, hematuria, dribbling, incontinence MSK- denies joint pain, muscle aches, injury Neuro- denies headache, dizziness, syncope, seizure activity       Objective:    BP 138/72   Pulse 66   Temp 98.8 F (37.1 C) (Oral)   Resp 14   Ht 5' (1.524 m)   Wt 142 lb (64.4 kg)   SpO2 99%   BMI 27.73 kg/m  GEN- NAD, alert and oriented x3, sits slumped over  HEENT- Right eye reactive , EOMI, non injected sclera, pink conjunctiva, MMM, oropharynx clear Neck- Supple, no thryomegaly  CVS- RRR, no murmur RESP-CTAB ABD-NABS,soft,NT,ND Skin- left buttocks, TTP over scar previous ulceration, no erythema EXT- No  edema Pulses- Radial 2+         Assessment & Plan:      Problem List Items Addressed This Visit      Unprioritized   HTN (hypertension)    Controlled no changes Had recent labs, which were at baseline       Other Visit Diagnoses    Altered mental status, unspecified altered mental status type    -  Primary   While I am ruling out UTI, checking labs, I think her behavior was due to the changes in her living situation. She is at her baseline today. Very polite answering questions. Discussed importance of eating, we did give her a case of ensure in case she does not eat a meal After labs return if nothing to treat specifically will need to talk with owner of group home   Relevant Orders   CBC with Differential/Platelet (Completed)   Comprehensive metabolic panel (Completed)   Urinalysis, Routine w reflex microscopic (Completed)   Urine Culture (Completed)   Pressure injury of left buttock, stage 1       duoderm applied with instructions fro group home to protect skin to prevent recurrent breakdown in tissue      Note: This dictation was prepared with Dragon dictation along with smaller phrase technology. Any transcriptional errors that result from this process are unintentional.

## 2017-07-27 NOTE — Progress Notes (Signed)
[name] OP Progress Note  08/01/2017 10:50 AM [name]  MRN:  [id number]  Chief Complaint:  Chief Complaint    Other; Follow-up     HPI:  Patient presents for follow-up appointment for bipolar 1 disorder.  The majority of the story is provided with the help of staff for transportation.   She states that she feels "fine."  She recently moved to another building, although the structure and the schedule is the same. There were some changes in staff. When she is asked about the group home, she states that she does not like the staffs as they would not speak nicely to her. She has HI ("wants to hurt them") at the moment, although she denies any plan or intent. She refuses to eat as she is tired of food. She agrees to drink ensure. She is able to repeat the plan after she is informed; "..doctor and nurse  come, then see what they can do for me."   She denies insomnia. She denies feeling depressed. She denies SI, AH, VH. She denies decreased need for sleep, euphoria or paranoia.    Per the transportation staff, [name], she has been belligerent and does not cooperative with the staff at group home. She refuses to eat, although she is willing to cooperate when she goes to her PCP. [name] has not noticed any change and she has been cooperative with her.   Wt Readings from Last 3 Encounters:  08/01/17 134 lb (60.8 kg)  07/27/17 142 lb (64.4 kg)  06/13/17 141 lb (64 kg)    Visit Diagnosis:    ICD-10-CM   1. Bipolar I disorder ([name]) F31.9     Past Psychiatric History:  I have reviewed the patient's psychiatry history in detail and updated the patient record. Outpatient: five years ago Psychiatry admission: a couple of times, last in 2013 at [facility] per sister, [facility] in 2012,  [facility] in Oct 2010 for 30 days,  first admission in 02/2002 "presented in the emergency room accompanied by her sister after taking off her clothes outdoors and running through the woods."  (was on lithium,  Depakote, haldol) Previous suicide attempt: denies Past trials of medication:  lithium, Depakote, haldol History of violence: denies   Past Medical History:  Past Medical History:  Diagnosis Date  . Adenomatous colon polyp   . Bipolar 1 disorder ([name])   . Diabetes mellitus   . Diverticula of colon Colonoscopy 2013  . Glaucoma   . Hypertension   . OSA (obstructive sleep apnea)   . Schizophrenia ([name])   . Stroke [facility])    2 TIA  . Thyroid disease 2010  . Urinary incontinence, mixed     Past Surgical History:  Procedure Laterality Date  . ABDOMINAL HYSTERECTOMY    . COLONOSCOPY  2007   pancolonic diverticulosis, tcs in 2012 due to h/o adenomatous polyps  . COLONOSCOPY  11/15/2011   Procedure: COLONOSCOPY;  Surgeon: Daneil Dolin, MD;  Location: [facility];  Service: Endoscopy;  Laterality: N/A;  7:30  . 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 MULTIPLE TOOTH EXTRACTIONS      Family Psychiatric History: I have reviewed the patient's family history in detail and updated the patient record.  Family History:  Family History  Problem Relation Age of Onset  . Other Unknown        unknown  . Thyroid disease Daughter        Partial thyroid  removal/tumor     Social History:  Social History   Socioeconomic History  . Marital status: Divorced    Spouse name: Not on file  . Number of children: Not on file  . Years of education: Not on file  . Highest education level: Not on file  Occupational History  . Not on file  Social Needs  . Financial resource strain: Not on file  . Food insecurity:    Worry: Not on file    Inability: Not on file  . Transportation needs:    Medical: Not on file    Non-medical: Not on file  Tobacco Use  . Smoking status: Never Smoker  . Smokeless tobacco: Never Used  Substance and Sexual Activity  . Alcohol use: No  . Drug use: No  . Sexual activity: Not on file  Lifestyle  . Physical activity:    Days per week: Not on file    Minutes per  session: Not on file  . Stress: Not on file  Relationships  . Social connections:    Talks on phone: Not on file    Gets together: Not on file    Attends religious service: Not on file    Active member of club or organization: Not on file    Attends meetings of clubs or organizations: Not on file    Relationship status: Not on file  Other Topics Concern  . Not on file  Social History Narrative   Lives in [facility]   She used to work as a Education officer, museum.    Divorced, married twice, has two children    Allergies:  Allergies  Allergen Reactions  . Codeine     Metabolic Disorder Labs: Lab Results  Component Value Date   HGBA1C 5.6 06/01/2017   MPG 114 06/01/2017   MPG 108 04/20/2016   No results found for: PROLACTIN Lab Results  Component Value Date   CHOL 126 06/01/2017   TRIG 78 06/01/2017   HDL 54 06/01/2017   CHOLHDL 2.3 06/01/2017   VLDL 13 04/20/2016   LDLCALC 56 06/01/2017   LDLCALC 71 04/20/2016   Lab Results  Component Value Date   TSH 1.43 06/01/2017   TSH 0.11 (L) 07/12/2016    Therapeutic Level Labs: No results found for: LITHIUM Lab Results  Component Value Date   VALPROATE 53.0 01/08/2009   No components found for:  CBMZ  Current Medications: Current Outpatient Medications  Medication Sig Dispense Refill  . amLODipine (NORVASC) 5 MG tablet Take 1 tablet (5 mg total) by mouth daily. 90 tablet 2  . aspirin EC 325 MG tablet Take 1 tablet (325 mg total) by mouth daily. 30 tablet 6  . Calcium Carbonate-Vitamin D (CALCIUM 600+D) 600-200 MG-UNIT TABS Take 1 tablet by mouth 2 (two) times daily. 60 tablet 6  . carvedilol (COREG) 6.25 MG tablet TAKE 1 TABLET BY MOUTH TWICE DAILY WITH A MEAL. 60 tablet 6  . cloNIDine (CATAPRES) 0.1 MG tablet TAKE 1 TABLET BY MOUTH IN THE EVENING. 30 tablet 11  . clopidogrel (PLAVIX) 75 MG tablet Take 1 tablet (75 mg total) by mouth daily. 30 tablet 6  . FANAPT 4 MG TABS tablet TAKE 1 TABLET BY MOUTH TWICE  DAILY. 60 tablet 11  . furosemide (LASIX) 20 MG tablet Take 1 tablet (20 mg total) by mouth daily. 30 tablet 6  . Glucose Blood (BLOOD GLUCOSE TEST STRIPS) STRP Use to monitor FSBS 1x daily. Dx: E11.9. 50 each 11  .  hydrocortisone (PROCTOCORT) 1 % CREA Apply to rectal area BID PRN for pain. Please dispense rectal tip. 1 Tube 3  . ketorolac (ACULAR) 0.5 % ophthalmic solution Place 1 drop into both eyes 4 (four) times daily.    Marland Kitchen latanoprost (XALATAN) 0.005 % ophthalmic solution INSTILL 1 DROP INTO RIGHT EYE AT BEDTIME. 2.5 mL 6  . loratadine (CLARITIN) 10 MG tablet Take 1 tablet (10 mg total) by mouth daily as needed for allergies. 30 tablet 6  . methimazole (TAPAZOLE) 5 MG tablet TAKE (1) TABLET BY MOUTH ONCE DAILY. 30 tablet 5  . OLANZapine (ZYPREXA) 5 MG tablet TAKE 1 TABLET BY MOUTH AT BEDTIME. 30 tablet 11  . perphenazine (TRILAFON) 2 MG tablet TAKE 1 TABLET BY MOUTH TWICE DAILY. 93 tablet 5  . perphenazine (TRILAFON) 4 MG tablet TAKE 1 TABLET BY MOUTH TWICE DAILY. 60 tablet 11  . pravastatin (PRAVACHOL) 20 MG tablet Take 1 tablet (20 mg total) by mouth every evening. 30 tablet 6  . temazepam (RESTORIL) 30 MG capsule Take 1 capsule (30 mg total) by mouth at bedtime as needed for sleep. 30 capsule 3  . timolol (TIMOPTIC) 0.5 % ophthalmic solution INSTILL 1 DROP INTO RIGHT EYE EVERY MORNING. 10 mL 11  . topiramate (TOPAMAX) 50 MG tablet Take 1 tablet (50 mg total) by mouth 3 (three) times daily. 270 tablet 0  . traMADol (ULTRAM) 50 MG tablet Take 1 tablet (50 mg total) by mouth every 12 (twelve) hours as needed. 30 tablet 0   No current facility-administered medications for this visit.      Musculoskeletal: Strength & Muscle Tone: within normal limits Gait & Station: normal Patient leans: N/A  Psychiatric Specialty Exam: Review of Systems  Psychiatric/Behavioral: Negative for depression, hallucinations, memory loss, substance abuse and suicidal ideas. The patient is not nervous/anxious  and does not have insomnia.   All other systems reviewed and are negative.   Blood pressure 112/74, pulse 74, height 5' (1.524 m), weight 134 lb (60.8 kg), SpO2 97 %.Body mass index is 26.17 kg/m.  General Appearance: Fairly Groomed  Eye Contact:  Minimal due to her posture  Speech:  Garbled  Volume:  Decreased  Mood:  "fine"  Affect:  Constricted  Thought Process:  Coherent  Orientation:  Full (Time, Place, and Person) except "2018, May, Friday "  Thought Content: Logical  Denies paranoia  Suicidal Thoughts:  No  Homicidal Thoughts:  No  Memory:  Immediate;   Fair  Judgement:  Fair  Insight:  Present  Psychomotor Activity:  Normal  Concentration:  Concentration: Good and Attention Span: Good  Recall:  Good  Fund of Knowledge: Good  Language: Good  Akathisia:  No  Handed:  Right  AIMS (if indicated): shuffling gait, cogwheel rigidity on bilateral arms  Assets:  Communication Skills Desire for Improvement  ADL's:  Impaired  Cognition: WNL  Sleep:  Good   Screenings: PHQ2-9     Office Visit from 06/01/2017 in [place] Office Visit from 11/28/2016 in [place] Office Visit from 01/21/2016 in [facility] Office Visit from 07/21/2015 in [facility] Office Visit from 06/11/2015 in [place]  PHQ-2 Total Score  0  0  0  0  0  PHQ-9 Total Score  0  -  -  -  1       Assessment and Plan:  [name] is a 72 y.o. year old female with a history of bipolar  disorder, subclinical hyperthyroidism, hypertension, diabetes, TIA , who presents for follow up appointment for Bipolar I disorder ([place])  # Bipolar I disorder # r.o schizoaffective disorder Patient demonstrates linear thought process, although she does have difficulty in articulation. Although she was reportedly belligerent and does not cooperate with staff at group home, it appears to be coincided with recent environmental  change. She is able to tell the reason of refusing certain activity. It is prudent to work on behavioral modification rather than medication change. [facility]. Will continue fanapt, perphenazine, olanzapine at this time for bipolar disorder. Noted that she does have parkinsonian symptoms, likely secondary to side effect form antipsychotics; she will greatly benefit from tapering off perphenazine in the future to avoid polypharmacy, although this medication change will not be done today given her recent episode at group home.    Plan I have reviewed and updated plans as below 1. Continue fanapt 4 mg BID 2. Continue perphenazine 2 mg twice a day  3. Continue olanzapine 5 mg qhs  4. Return to clinic in one month for 30 mins (she is on topiramate 50 mg TID, clonidine 0.1 mg qpm, temazepam 30 mg qhs) 5. Contact [facility] [address] [place], [place] [address] Office: [phone] 6. Return to clinic in one month for 30 mins if she is not accepted to [facility]  The patient demonstrates the following risk factors for suicide: Chronic risk factors for suicide include: psychiatric disorder of bipolar I disorder. Acute risk factors for suicide include: unemployment. Protective factors for this patient include: positive social support and hope for the future. Considering these factors, the overall suicide risk at this point appears to be low. Patient is appropriate for outpatient follow up.  The duration of this appointment visit was 30 minutes of face-to-face time with the patient.  Greater than 50% of this time was spent in counseling, explanation of  diagnosis, planning of further management, and coordination of care.  Norman Clay, MD 08/01/2017, 10:50 AM

## 2017-07-28 LAB — CBC WITH DIFFERENTIAL/PLATELET
BASOS ABS: 38 {cells}/uL (ref 0–200)
Basophils Relative: 0.6 %
EOS ABS: 139 {cells}/uL (ref 15–500)
EOS PCT: 2.2 %
HEMATOCRIT: 34.1 % — AB (ref 35.0–45.0)
HEMOGLOBIN: 11.3 g/dL — AB (ref 11.7–15.5)
LYMPHS ABS: 1827 {cells}/uL (ref 850–3900)
MCH: 27.4 pg (ref 27.0–33.0)
MCHC: 33.1 g/dL (ref 32.0–36.0)
MCV: 82.6 fL (ref 80.0–100.0)
MONOS PCT: 8.7 %
MPV: 12.1 fL (ref 7.5–12.5)
NEUTROS ABS: 3749 {cells}/uL (ref 1500–7800)
Neutrophils Relative %: 59.5 %
Platelets: 223 10*3/uL (ref 140–400)
RBC: 4.13 10*6/uL (ref 3.80–5.10)
RDW: 13 % (ref 11.0–15.0)
Total Lymphocyte: 29 %
WBC mixed population: 548 cells/uL (ref 200–950)
WBC: 6.3 10*3/uL (ref 3.8–10.8)

## 2017-07-28 LAB — COMPREHENSIVE METABOLIC PANEL
AG RATIO: 1.5 (calc) (ref 1.0–2.5)
ALKALINE PHOSPHATASE (APISO): 71 U/L (ref 33–130)
ALT: 4 U/L — AB (ref 6–29)
AST: 13 U/L (ref 10–35)
Albumin: 3.7 g/dL (ref 3.6–5.1)
BILIRUBIN TOTAL: 0.3 mg/dL (ref 0.2–1.2)
BUN/Creatinine Ratio: 15 (calc) (ref 6–22)
BUN: 22 mg/dL (ref 7–25)
CALCIUM: 9.8 mg/dL (ref 8.6–10.4)
CO2: 27 mmol/L (ref 20–32)
Chloride: 111 mmol/L — ABNORMAL HIGH (ref 98–110)
Creat: 1.5 mg/dL — ABNORMAL HIGH (ref 0.60–0.93)
GLOBULIN: 2.5 g/dL (ref 1.9–3.7)
GLUCOSE: 87 mg/dL (ref 65–99)
Potassium: 3.6 mmol/L (ref 3.5–5.3)
SODIUM: 147 mmol/L — AB (ref 135–146)
TOTAL PROTEIN: 6.2 g/dL (ref 6.1–8.1)

## 2017-07-28 LAB — URINE CULTURE
MICRO NUMBER: 90603737
SPECIMEN QUALITY: ADEQUATE

## 2017-07-29 ENCOUNTER — Encounter: Payer: Self-pay | Admitting: Family Medicine

## 2017-07-29 NOTE — Assessment & Plan Note (Signed)
Controlled no changes Had recent labs, which were at baseline

## 2017-07-30 ENCOUNTER — Telehealth: Payer: Self-pay | Admitting: *Deleted

## 2017-07-30 DIAGNOSIS — R4182 Altered mental status, unspecified: Secondary | ICD-10-CM

## 2017-07-30 DIAGNOSIS — L89321 Pressure ulcer of left buttock, stage 1: Secondary | ICD-10-CM

## 2017-07-30 DIAGNOSIS — R2681 Unsteadiness on feet: Secondary | ICD-10-CM

## 2017-07-30 NOTE — Telephone Encounter (Signed)
Received call from patient daughter [name] [phone] telephone. States that she has come up from [place] to visit with patient and is concerned about her care. Reports that she feels that she may require SN care for weakness.   MD please advise.

## 2017-07-31 NOTE — Telephone Encounter (Signed)
We can fill out FL2, she would need to find a facility, family has to do this leg work Musician to order Education officer, museum since she is in Group home to assist

## 2017-08-01 ENCOUNTER — Encounter (HOSPITAL_COMMUNITY): Payer: Self-pay | Admitting: Psychiatry

## 2017-08-01 ENCOUNTER — Ambulatory Visit (INDEPENDENT_AMBULATORY_CARE_PROVIDER_SITE_OTHER): Payer: Medicare Other | Admitting: Psychiatry

## 2017-08-01 VITALS — BP 112/74 | HR 74 | Ht 60.0 in | Wt 134.0 lb

## 2017-08-01 DIAGNOSIS — F319 Bipolar disorder, unspecified: Secondary | ICD-10-CM

## 2017-08-01 DIAGNOSIS — Z592 Discord with neighbors, lodgers and landlord: Secondary | ICD-10-CM

## 2017-08-01 DIAGNOSIS — R45850 Homicidal ideations: Secondary | ICD-10-CM | POA: Diagnosis not present

## 2017-08-01 NOTE — Patient Instructions (Signed)
1. Continue fanapt 4 mg BID 2. Continue perphenazine 2 mg twice a day  3. Continue olanzapine 5 mg qhs 4. Return to clinic in one month for 30 mins 5. Contact [facility] [address] [place], [place] [address] Office: [phone]

## 2017-08-02 NOTE — Telephone Encounter (Signed)
Call placed to patient and patient made aware.   HH referral placed.   FL 2 to be completed.

## 2017-08-08 DIAGNOSIS — Z79899 Other long term (current) drug therapy: Secondary | ICD-10-CM | POA: Diagnosis not present

## 2017-08-08 DIAGNOSIS — Z7902 Long term (current) use of antithrombotics/antiplatelets: Secondary | ICD-10-CM | POA: Diagnosis not present

## 2017-08-08 DIAGNOSIS — R4182 Altered mental status, unspecified: Secondary | ICD-10-CM | POA: Diagnosis not present

## 2017-08-08 DIAGNOSIS — I10 Essential (primary) hypertension: Secondary | ICD-10-CM | POA: Diagnosis not present

## 2017-08-08 DIAGNOSIS — Z7982 Long term (current) use of aspirin: Secondary | ICD-10-CM | POA: Diagnosis not present

## 2017-08-08 DIAGNOSIS — Z4800 Encounter for change or removal of nonsurgical wound dressing: Secondary | ICD-10-CM | POA: Diagnosis not present

## 2017-08-08 DIAGNOSIS — L89322 Pressure ulcer of left buttock, stage 2: Secondary | ICD-10-CM | POA: Diagnosis not present

## 2017-08-09 ENCOUNTER — Telehealth: Payer: Self-pay | Admitting: *Deleted

## 2017-08-09 NOTE — Telephone Encounter (Signed)
Noted. Can you call daughter, is she proceeding with moving her to SNF If so we need to complete FL2

## 2017-08-09 NOTE — Telephone Encounter (Signed)
Received call from Malachy Mood [facility] SN with [facility] [phone]~ telephone.  Requested VO to continue [facility] SN 3x weekly for wound care. 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 Reports that [facility] SN are using skin prep and hydrocolloid dressing at this time. Requested order to have option to use foam dressing if needed.   VO given.

## 2017-08-09 NOTE — Telephone Encounter (Signed)
Call placed to patient daughter Constance Holster.   States that she is awaiting call from SW to assist in placement. FL2 is completed. Awaiting placement to have MD sign.

## 2017-08-10 NOTE — Telephone Encounter (Signed)
noted 

## 2017-08-11 DIAGNOSIS — Z4800 Encounter for change or removal of nonsurgical wound dressing: Secondary | ICD-10-CM | POA: Diagnosis not present

## 2017-08-11 DIAGNOSIS — I10 Essential (primary) hypertension: Secondary | ICD-10-CM | POA: Diagnosis not present

## 2017-08-11 DIAGNOSIS — L89322 Pressure ulcer of left buttock, stage 2: Secondary | ICD-10-CM | POA: Diagnosis not present

## 2017-08-11 DIAGNOSIS — Z7982 Long term (current) use of aspirin: Secondary | ICD-10-CM | POA: Diagnosis not present

## 2017-08-11 DIAGNOSIS — R4182 Altered mental status, unspecified: Secondary | ICD-10-CM | POA: Diagnosis not present

## 2017-08-11 DIAGNOSIS — Z7902 Long term (current) use of antithrombotics/antiplatelets: Secondary | ICD-10-CM | POA: Diagnosis not present

## 2017-08-13 DIAGNOSIS — I10 Essential (primary) hypertension: Secondary | ICD-10-CM | POA: Diagnosis not present

## 2017-08-13 DIAGNOSIS — Z7982 Long term (current) use of aspirin: Secondary | ICD-10-CM | POA: Diagnosis not present

## 2017-08-13 DIAGNOSIS — Z7902 Long term (current) use of antithrombotics/antiplatelets: Secondary | ICD-10-CM | POA: Diagnosis not present

## 2017-08-13 DIAGNOSIS — Z4800 Encounter for change or removal of nonsurgical wound dressing: Secondary | ICD-10-CM | POA: Diagnosis not present

## 2017-08-13 DIAGNOSIS — L89322 Pressure ulcer of left buttock, stage 2: Secondary | ICD-10-CM | POA: Diagnosis not present

## 2017-08-13 DIAGNOSIS — R4182 Altered mental status, unspecified: Secondary | ICD-10-CM | POA: Diagnosis not present

## 2017-08-14 DIAGNOSIS — H401113 Primary open-angle glaucoma, right eye, severe stage: Secondary | ICD-10-CM | POA: Diagnosis not present

## 2017-08-15 DIAGNOSIS — R4182 Altered mental status, unspecified: Secondary | ICD-10-CM | POA: Diagnosis not present

## 2017-08-15 DIAGNOSIS — Z4800 Encounter for change or removal of nonsurgical wound dressing: Secondary | ICD-10-CM | POA: Diagnosis not present

## 2017-08-15 DIAGNOSIS — Z7902 Long term (current) use of antithrombotics/antiplatelets: Secondary | ICD-10-CM | POA: Diagnosis not present

## 2017-08-15 DIAGNOSIS — I10 Essential (primary) hypertension: Secondary | ICD-10-CM | POA: Diagnosis not present

## 2017-08-15 DIAGNOSIS — Z7982 Long term (current) use of aspirin: Secondary | ICD-10-CM | POA: Diagnosis not present

## 2017-08-15 DIAGNOSIS — L89322 Pressure ulcer of left buttock, stage 2: Secondary | ICD-10-CM | POA: Diagnosis not present

## 2017-08-20 ENCOUNTER — Other Ambulatory Visit: Payer: Self-pay | Admitting: Family Medicine

## 2017-08-22 ENCOUNTER — Telehealth: Payer: Self-pay | Admitting: *Deleted

## 2017-08-22 DIAGNOSIS — Z7902 Long term (current) use of antithrombotics/antiplatelets: Secondary | ICD-10-CM | POA: Diagnosis not present

## 2017-08-22 DIAGNOSIS — Z4800 Encounter for change or removal of nonsurgical wound dressing: Secondary | ICD-10-CM | POA: Diagnosis not present

## 2017-08-22 DIAGNOSIS — L89322 Pressure ulcer of left buttock, stage 2: Secondary | ICD-10-CM | POA: Diagnosis not present

## 2017-08-22 DIAGNOSIS — Z7982 Long term (current) use of aspirin: Secondary | ICD-10-CM | POA: Diagnosis not present

## 2017-08-22 DIAGNOSIS — R4182 Altered mental status, unspecified: Secondary | ICD-10-CM | POA: Diagnosis not present

## 2017-08-22 DIAGNOSIS — I10 Essential (primary) hypertension: Secondary | ICD-10-CM | POA: Diagnosis not present

## 2017-08-22 NOTE — Telephone Encounter (Signed)
Received call from Juliann [facility] SN with [place]. [phone] telephone.  Reports that patient has foul odor to urine and is more drowsy throughout the day and night. States that she is also leaning to one side more than another.   Requested order for UA.   VO given. MD made aware.

## 2017-08-23 ENCOUNTER — Encounter: Payer: Self-pay | Admitting: Family Medicine

## 2017-08-23 DIAGNOSIS — N3001 Acute cystitis with hematuria: Secondary | ICD-10-CM | POA: Diagnosis not present

## 2017-08-23 DIAGNOSIS — L89322 Pressure ulcer of left buttock, stage 2: Secondary | ICD-10-CM | POA: Diagnosis not present

## 2017-08-23 DIAGNOSIS — Z7902 Long term (current) use of antithrombotics/antiplatelets: Secondary | ICD-10-CM | POA: Diagnosis not present

## 2017-08-23 DIAGNOSIS — Z4800 Encounter for change or removal of nonsurgical wound dressing: Secondary | ICD-10-CM | POA: Diagnosis not present

## 2017-08-23 DIAGNOSIS — I10 Essential (primary) hypertension: Secondary | ICD-10-CM | POA: Diagnosis not present

## 2017-08-23 DIAGNOSIS — Z7982 Long term (current) use of aspirin: Secondary | ICD-10-CM | POA: Diagnosis not present

## 2017-08-23 DIAGNOSIS — Z79899 Other long term (current) drug therapy: Secondary | ICD-10-CM | POA: Diagnosis not present

## 2017-08-23 DIAGNOSIS — R4182 Altered mental status, unspecified: Secondary | ICD-10-CM | POA: Diagnosis not present

## 2017-08-23 DIAGNOSIS — N390 Urinary tract infection, site not specified: Secondary | ICD-10-CM | POA: Diagnosis not present

## 2017-08-24 ENCOUNTER — Telehealth: Payer: Self-pay | Admitting: *Deleted

## 2017-08-24 NOTE — Telephone Encounter (Signed)
noted 

## 2017-08-24 NOTE — Telephone Encounter (Signed)
Received fax from [facility] with UA results. Urine noted as follows: Cloudy light yellow urine, nitrite positive, trace blood with moderate Leuks, 30-40 White cells and large amount of bacteria.   MD made aware and NO given: Keflex 500mg  PO QID x5 days.   Call placed to [name], facility administrator [phone] telephone. Reports that he took patient to [facility] on 08/23/2017 and ABTx was started.   Records requested. MD to be made aware.

## 2017-08-30 NOTE — Progress Notes (Signed)
[facility] MD/PA/NP OP Progress Note  09/04/2017 11:28 AM [name]  MRN:  [id number]  Chief Complaint:  Chief Complaint    Follow-up; Other     HPI:  Per chart review, the patient was treated for urinary tract infection.  The patient presents for follow-up appointment for bipolar disorder. She states that she feels "even keeled." She does not have any issues at home. She enjoys watching TV drama. She occasionally goes out of porch. She denies feeling depressed, anxiety. She denies SI, HI, Ah, VH. She denies euphoria.   [name], the transportation staff presents to the interview.  The patient is doing very well since the last visit. One of staffs was replaced and the patient is doing well with staffs. She eats well and is compliant to direction. [name] denies any behavioral concerns. She did not hear back from [facility].  No change in Functional Status as below Instrumental Activities of Daily Living (IADLs):  [name] is independent in the following:  Requires assistance with the following: managing finances, medications, driving  Activities of Daily Living (ADLs):  [name] is independent in the following:  feeding,  Requires assistance with the following: continence, grooming and toileting, walking, bathing and hygiene,  Wt Readings from Last 3 Encounters:  09/04/17 134 lb (60.8 kg)  08/01/17 134 lb (60.8 kg)  07/27/17 142 lb (64.4 kg)    Visit Diagnosis:    ICD-10-CM   1. Bipolar I disorder ([place]) F31.9     Past Psychiatric History: Please see initial evaluation for full details. I have reviewed the history. No updates at this time.     Past Medical History:  Past Medical History:  Diagnosis Date  . Adenomatous colon polyp   . Bipolar 1 disorder ([place])   . Diabetes mellitus   . Diverticula of colon Colonoscopy 2013  . Glaucoma   . Hypertension   . OSA (obstructive sleep apnea)   . Schizophrenia ([place])   . Stroke [facility])    2 TIA  . Thyroid  disease 2010  . Urinary incontinence, mixed     Past Surgical History:  Procedure Laterality Date  . ABDOMINAL HYSTERECTOMY    . COLONOSCOPY  2007   pancolonic diverticulosis, tcs in 2012 due to h/o adenomatous polyps  . COLONOSCOPY  11/15/2011   Procedure: COLONOSCOPY;  Surgeon: Daneil Dolin, MD;  Location: [facility];  Service: Endoscopy;  Laterality: N/A;  7:30  . 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 MULTIPLE TOOTH EXTRACTIONS      Family Psychiatric History: Please see initial evaluation for full details. I have reviewed the history. No updates at this time.     Family History:  Family History  Problem Relation Age of Onset  . Other Unknown        unknown  . Thyroid disease Daughter        Partial thyroid removal/tumor     Social History:  Social History   Socioeconomic History  . Marital status: Divorced    Spouse name: Not on file  . Number of children: Not on file  . Years of education: Not on file  . Highest education level: Not on file  Occupational History  . Not on file  Social Needs  . Financial resource strain: Not on file  . Food insecurity:    Worry: Not on file    Inability: Not on file  . Transportation needs:  Medical: Not on file    Non-medical: Not on file  Tobacco Use  . Smoking status: Never Smoker  . Smokeless tobacco: Never Used  Substance and Sexual Activity  . Alcohol use: No  . Drug use: No  . Sexual activity: Not on file  Lifestyle  . Physical activity:    Days per week: Not on file    Minutes per session: Not on file  . Stress: Not on file  Relationships  . Social connections:    Talks on phone: Not on file    Gets together: Not on file    Attends religious service: Not on file    Active member of club or organization: Not on file    Attends meetings of clubs or organizations: Not on file    Relationship status: Not on file  Other Topics Concern  . Not on file  Social History Narrative   Lives in [facility]   She used to work as a Education officer, museum.    Divorced, married twice, has two children    Allergies:  Allergies  Allergen Reactions  . Codeine     Metabolic Disorder Labs: Lab Results  Component Value Date   HGBA1C 5.6 06/01/2017   MPG 114 06/01/2017   MPG 108 04/20/2016   No results found for: PROLACTIN Lab Results  Component Value Date   CHOL 126 06/01/2017   TRIG 78 06/01/2017   HDL 54 06/01/2017   CHOLHDL 2.3 06/01/2017   VLDL 13 04/20/2016   LDLCALC 56 06/01/2017   LDLCALC 71 04/20/2016   Lab Results  Component Value Date   TSH 1.43 06/01/2017   TSH 0.11 (L) 07/12/2016    Therapeutic Level Labs: No results found for: LITHIUM Lab Results  Component Value Date   VALPROATE 53.0 01/08/2009   No components found for:  CBMZ  Current Medications: Current Outpatient Medications  Medication Sig Dispense Refill  . amLODipine (NORVASC) 5 MG tablet Take 1 tablet (5 mg total) by mouth daily. 90 tablet 2  . aspirin EC 325 MG tablet Take 1 tablet (325 mg total) by mouth daily. 30 tablet 6  . Calcium Carbonate-Vitamin D (CALCIUM 600+D) 600-200 MG-UNIT TABS Take 1 tablet by mouth 2 (two) times daily. 60 tablet 6  . carvedilol (COREG) 6.25 MG tablet TAKE 1 TABLET BY MOUTH TWICE DAILY WITH A MEAL. 60 tablet 6  . cloNIDine (CATAPRES) 0.1 MG tablet TAKE 1 TABLET BY MOUTH IN THE EVENING. 30 tablet 11  . clopidogrel (PLAVIX) 75 MG tablet Take 1 tablet (75 mg total) by mouth daily. 30 tablet 6  . FANAPT 4 MG TABS tablet TAKE 1 TABLET BY MOUTH TWICE DAILY. 60 tablet 11  . furosemide (LASIX) 20 MG tablet Take 1 tablet (20 mg total) by mouth daily. 30 tablet 6  . Glucose Blood (BLOOD GLUCOSE TEST STRIPS) STRP Use to monitor FSBS 1x daily. Dx: E11.9. 50 each 11  . hydrocortisone (PROCTOCORT) 1 % CREA Apply to rectal area BID PRN for pain. Please dispense rectal tip. 1 Tube 3  . ketorolac (ACULAR) 0.5 % ophthalmic solution Place 1 drop into both eyes 4 (four) times daily.    Marland Kitchen  latanoprost (XALATAN) 0.005 % ophthalmic solution INSTILL 1 DROP INTO RIGHT EYE AT BEDTIME. 2.5 mL 6  . loratadine (CLARITIN) 10 MG tablet Take 1 tablet (10 mg total) by mouth daily as needed for allergies. 30 tablet 6  . methimazole (TAPAZOLE) 5 MG tablet TAKE (1) TABLET BY  MOUTH ONCE DAILY. 30 tablet 5  . OLANZapine (ZYPREXA) 5 MG tablet TAKE 1 TABLET BY MOUTH AT BEDTIME. 30 tablet 11  . perphenazine (TRILAFON) 2 MG tablet TAKE 1 TABLET BY MOUTH TWICE DAILY. 93 tablet 5  . pravastatin (PRAVACHOL) 20 MG tablet Take 1 tablet (20 mg total) by mouth every evening. 30 tablet 6  . temazepam (RESTORIL) 30 MG capsule Take 1 capsule (30 mg total) by mouth at bedtime as needed for sleep. 30 capsule 3  . timolol (TIMOPTIC) 0.5 % ophthalmic solution INSTILL 1 DROP INTO RIGHT EYE EVERY MORNING. 10 mL 11  . topiramate (TOPAMAX) 50 MG tablet TAKE 1 TABLET BY MOUTH 3 TIMES DAILY. 90 tablet 11  . traMADol (ULTRAM) 50 MG tablet Take 1 tablet (50 mg total) by mouth every 12 (twelve) hours as needed. 30 tablet 0   No current facility-administered medications for this visit.      Musculoskeletal: Strength & Muscle Tone: within normal limits Gait & Station: normal Patient leans: N/A  Psychiatric Specialty Exam: Review of Systems  Psychiatric/Behavioral: Negative for depression, hallucinations, memory loss, substance abuse and suicidal ideas. The patient is not nervous/anxious and does not have insomnia.   All other systems reviewed and are negative.   Blood pressure 116/74, pulse 74, height 5' (1.524 m), weight 134 lb (60.8 kg), SpO2 98 %.Body mass index is 26.17 kg/m.  General Appearance: Fairly Groomed  Eye Contact:  Fair (slightly limited due to her posture)  Speech:  Clear and Coherent not spontaneous  Volume:  Normal   Mood:  "good"  Affect:  Appropriate, Congruent, Restricted and calm  Thought Process:  Coherent  Orientation:  Full (Time, Place, and Person)  Thought Content: Logical , no  paranoia  Suicidal Thoughts:  No  Homicidal Thoughts:  No  Memory:  Immediate;   Good  Judgement:  Fair  Insight:  Present  Psychomotor Activity:  Normal  Concentration:  Concentration: Good and Attention Span: Good  Recall:  Good  Fund of Knowledge: Good  Language: Good  Akathisia:  No  Handed:  Right  AIMS (if indicated): cogwheel rigidity on bilateral arms, no resting tremors  Assets:  Communication Skills Desire for Improvement  ADL's:  Intact  Cognition: WNL  Sleep:  Good   Screenings: PHQ2-9     Office Visit from 06/01/2017 in [facility] Visit from 11/28/2016 in [facility] Visit from 01/21/2016 in [facility] Office Visit from 07/21/2015 in [facility] Office Visit from 06/11/2015 in [facility]  PHQ-2 Total Score  0  0  0  0  0  PHQ-9 Total Score  0  -  -  -  1     Mini Cog- 2/3, Clock 1/3 (misplaced numbers, clock hands), oriented x 5 except "July"  09/04/17    Assessment and Plan:  [name] is a 71 y.o. year old female with a history of bipolar I disorder, subclinical hyperthyroidism, hypertension, diabetes, TIA , who presents for follow up appointment for Bipolar I disorder ([place])  # Bipolar I disorder # r/o schizoaffective disorder Patient continues to demonstrate linear thought process, although her speech is not spontaneous and she does have difficulty in articulation. The staff denies any behavior issues since one of the staff is replaced. Will taper down perphenazine to avoid polypharmacy. Will continue fanapt, olanzapine to target bipolar disorder. Discussed risks, including, but not limited to metabolic side effect and EPS.  Plan  I have reviewed and updated plans as below 1. Continue fanapt 4 mg BID 2. Decrease perphenazine 1 mg twice a day  3. Continue olanzapine 5 mg qhs  4.Return to clinic in three months for 15 mins (she is on  topiramate 50 mg TID, clonidine 0.1 mg qpm, temazepam 30 mg qhs)  The patient demonstrates the following risk factors for suicide: Chronic risk factors for suicide include:psychiatric disorder ofbipolar I disorder. Acute risk factorsfor suicide include: unemployment. Protective factorsfor this patient include: positive social support and hope for the future. Considering these factors, the overall suicide risk at this point appears to below. Patientisappropriate for outpatient follow up.  .The duration of this appointment visit was 30 minutes of face-to-face time with the patient.  Greater than 50% of this time was spent in counseling, explanation of  diagnosis, planning of further management, and coordination of care.   Norman Clay, MD 09/04/2017, 11:28 AM

## 2017-08-31 DIAGNOSIS — L89322 Pressure ulcer of left buttock, stage 2: Secondary | ICD-10-CM | POA: Diagnosis not present

## 2017-08-31 DIAGNOSIS — I10 Essential (primary) hypertension: Secondary | ICD-10-CM | POA: Diagnosis not present

## 2017-08-31 DIAGNOSIS — Z7902 Long term (current) use of antithrombotics/antiplatelets: Secondary | ICD-10-CM | POA: Diagnosis not present

## 2017-08-31 DIAGNOSIS — Z4800 Encounter for change or removal of nonsurgical wound dressing: Secondary | ICD-10-CM | POA: Diagnosis not present

## 2017-08-31 DIAGNOSIS — R4182 Altered mental status, unspecified: Secondary | ICD-10-CM | POA: Diagnosis not present

## 2017-08-31 DIAGNOSIS — Z7982 Long term (current) use of aspirin: Secondary | ICD-10-CM | POA: Diagnosis not present

## 2017-09-04 ENCOUNTER — Ambulatory Visit (INDEPENDENT_AMBULATORY_CARE_PROVIDER_SITE_OTHER): Payer: Medicare Other | Admitting: Psychiatry

## 2017-09-04 ENCOUNTER — Telehealth: Payer: Self-pay | Admitting: Family Medicine

## 2017-09-04 ENCOUNTER — Encounter (HOSPITAL_COMMUNITY): Payer: Self-pay | Admitting: Psychiatry

## 2017-09-04 VITALS — BP 116/74 | HR 74 | Ht 60.0 in | Wt 134.0 lb

## 2017-09-04 DIAGNOSIS — F319 Bipolar disorder, unspecified: Secondary | ICD-10-CM | POA: Diagnosis not present

## 2017-09-04 MED ORDER — POLYETHYLENE GLYCOL 3350 17 GM/SCOOP PO POWD: 17.0000 g | Freq: Two times a day (BID) | ORAL | 1 refills | Status: AC | PRN

## 2017-09-04 NOTE — Telephone Encounter (Signed)
Call placed to facility.   Was advised that current aide has been caring soley for patient since Thursday. States that she has had very small hard round stools to pass, but it has not been much.   Denies reports of abdominal pain, loss of appetite. Order faxed to facility for Miralax.

## 2017-09-04 NOTE — Telephone Encounter (Signed)
Miralax BID If this does not work can use dulcolax suppository

## 2017-09-04 NOTE — Telephone Encounter (Signed)
Patient has not had bowl movement in 5 days  Would like to know if something can be called in for her  [place] is who caled to request this [phone]

## 2017-09-04 NOTE — Patient Instructions (Addendum)
1. Continue fanapt 4 mg BID 2. Decrease perphenazine 1 mg twice a day  3. Continue olanzapine 5 mg qhs  4.Return to clinic in three months for 15 mins

## 2017-09-05 MED ORDER — BISACODYL 10 MG RE SUPP: 10.0000 mg | RECTAL | 0 refills | Status: AC | PRN

## 2017-09-05 NOTE — Telephone Encounter (Signed)
Order faxed to facility for Ducolax 10mg  Suppository Q3D PRN if no BM x3 days.

## 2017-09-06 DIAGNOSIS — R4182 Altered mental status, unspecified: Secondary | ICD-10-CM | POA: Diagnosis not present

## 2017-09-06 DIAGNOSIS — Z7982 Long term (current) use of aspirin: Secondary | ICD-10-CM | POA: Diagnosis not present

## 2017-09-06 DIAGNOSIS — I10 Essential (primary) hypertension: Secondary | ICD-10-CM | POA: Diagnosis not present

## 2017-09-06 DIAGNOSIS — L89322 Pressure ulcer of left buttock, stage 2: Secondary | ICD-10-CM | POA: Diagnosis not present

## 2017-09-06 DIAGNOSIS — Z4800 Encounter for change or removal of nonsurgical wound dressing: Secondary | ICD-10-CM | POA: Diagnosis not present

## 2017-09-06 DIAGNOSIS — Z7902 Long term (current) use of antithrombotics/antiplatelets: Secondary | ICD-10-CM | POA: Diagnosis not present

## 2017-09-11 DIAGNOSIS — Z7982 Long term (current) use of aspirin: Secondary | ICD-10-CM | POA: Diagnosis not present

## 2017-09-11 DIAGNOSIS — R4182 Altered mental status, unspecified: Secondary | ICD-10-CM | POA: Diagnosis not present

## 2017-09-11 DIAGNOSIS — L89322 Pressure ulcer of left buttock, stage 2: Secondary | ICD-10-CM | POA: Diagnosis not present

## 2017-09-11 DIAGNOSIS — Z7902 Long term (current) use of antithrombotics/antiplatelets: Secondary | ICD-10-CM | POA: Diagnosis not present

## 2017-09-11 DIAGNOSIS — I10 Essential (primary) hypertension: Secondary | ICD-10-CM | POA: Diagnosis not present

## 2017-09-11 DIAGNOSIS — Z4800 Encounter for change or removal of nonsurgical wound dressing: Secondary | ICD-10-CM | POA: Diagnosis not present

## 2017-09-19 ENCOUNTER — Ambulatory Visit: Payer: Medicare Other | Admitting: "Endocrinology

## 2017-09-19 DIAGNOSIS — L89322 Pressure ulcer of left buttock, stage 2: Secondary | ICD-10-CM | POA: Diagnosis not present

## 2017-09-19 DIAGNOSIS — R4182 Altered mental status, unspecified: Secondary | ICD-10-CM | POA: Diagnosis not present

## 2017-09-19 DIAGNOSIS — I10 Essential (primary) hypertension: Secondary | ICD-10-CM | POA: Diagnosis not present

## 2017-09-19 DIAGNOSIS — Z7902 Long term (current) use of antithrombotics/antiplatelets: Secondary | ICD-10-CM | POA: Diagnosis not present

## 2017-09-19 DIAGNOSIS — Z7982 Long term (current) use of aspirin: Secondary | ICD-10-CM | POA: Diagnosis not present

## 2017-09-19 DIAGNOSIS — Z4800 Encounter for change or removal of nonsurgical wound dressing: Secondary | ICD-10-CM | POA: Diagnosis not present

## 2017-09-25 DIAGNOSIS — L89322 Pressure ulcer of left buttock, stage 2: Secondary | ICD-10-CM | POA: Diagnosis not present

## 2017-09-25 DIAGNOSIS — R4182 Altered mental status, unspecified: Secondary | ICD-10-CM | POA: Diagnosis not present

## 2017-09-25 DIAGNOSIS — I10 Essential (primary) hypertension: Secondary | ICD-10-CM | POA: Diagnosis not present

## 2017-09-25 DIAGNOSIS — Z7902 Long term (current) use of antithrombotics/antiplatelets: Secondary | ICD-10-CM | POA: Diagnosis not present

## 2017-09-25 DIAGNOSIS — Z4800 Encounter for change or removal of nonsurgical wound dressing: Secondary | ICD-10-CM | POA: Diagnosis not present

## 2017-09-25 DIAGNOSIS — Z7982 Long term (current) use of aspirin: Secondary | ICD-10-CM | POA: Diagnosis not present

## 2017-09-27 DIAGNOSIS — E1151 Type 2 diabetes mellitus with diabetic peripheral angiopathy without gangrene: Secondary | ICD-10-CM | POA: Diagnosis not present

## 2017-09-27 DIAGNOSIS — B351 Tinea unguium: Secondary | ICD-10-CM | POA: Diagnosis not present

## 2017-10-01 ENCOUNTER — Ambulatory Visit: Payer: Medicare Other | Admitting: Family Medicine

## 2017-10-03 ENCOUNTER — Encounter: Payer: Self-pay | Admitting: Family Medicine

## 2017-10-03 ENCOUNTER — Ambulatory Visit (INDEPENDENT_AMBULATORY_CARE_PROVIDER_SITE_OTHER): Payer: Medicare Other | Admitting: Family Medicine

## 2017-10-03 VITALS — BP 140/90 | HR 68 | Temp 99.1°F | Resp 14 | Ht 60.0 in | Wt 132.0 lb

## 2017-10-03 DIAGNOSIS — R634 Abnormal weight loss: Secondary | ICD-10-CM

## 2017-10-03 DIAGNOSIS — I10 Essential (primary) hypertension: Secondary | ICD-10-CM

## 2017-10-03 DIAGNOSIS — R4182 Altered mental status, unspecified: Secondary | ICD-10-CM | POA: Diagnosis not present

## 2017-10-03 DIAGNOSIS — F319 Bipolar disorder, unspecified: Secondary | ICD-10-CM | POA: Diagnosis not present

## 2017-10-03 DIAGNOSIS — Z4800 Encounter for change or removal of nonsurgical wound dressing: Secondary | ICD-10-CM | POA: Diagnosis not present

## 2017-10-03 DIAGNOSIS — Z7982 Long term (current) use of aspirin: Secondary | ICD-10-CM | POA: Diagnosis not present

## 2017-10-03 DIAGNOSIS — E440 Moderate protein-calorie malnutrition: Secondary | ICD-10-CM

## 2017-10-03 DIAGNOSIS — Z7902 Long term (current) use of antithrombotics/antiplatelets: Secondary | ICD-10-CM | POA: Diagnosis not present

## 2017-10-03 DIAGNOSIS — L89322 Pressure ulcer of left buttock, stage 2: Secondary | ICD-10-CM | POA: Diagnosis not present

## 2017-10-03 NOTE — Patient Instructions (Addendum)
We will call Dr. Modesta Messing about [name]  Boost/Ensure twice a day  Eat all 3 meals  F/U 4 weeks

## 2017-10-03 NOTE — Progress Notes (Signed)
   Subjective:    Patient ID: [name], female    DOB: [birth year], 72 y.o.   MRN: [id number]  Patient presents for Medication Management and Medication Refill  Since last visit back at the previous house. She likes the new caregiver and being back at the home  There has been no further talk from family about moving her to SNF   Weight loss of 10lbs since May She has not been drinking her ensure.  No pains when she eats, having regular bowel movements  States she feels good   No concerns noted from faciltiy       Review Of Systems:  GEN- denies fatigue, fever, weight loss,weakness, recent illness HEENT- denies eye drainage, change in vision, nasal discharge, CVS- denies chest pain, palpitations RESP- denies SOB, cough, wheeze ABD- denies N/V, change in stools, abd pain GU- denies dysuria, hematuria, dribbling, incontinence MSK- denies joint pain, muscle aches, injury Neuro- denies headache, dizziness, syncope, seizure activity       Objective:    BP 140/90   Pulse 68   Temp 99.1 F (37.3 C) (Oral)   Resp 14   Ht 5' (1.524 m)   Wt 132 lb (59.9 kg)   SpO2 99%   BMI 25.78 kg/m  GEN- NAD, alert and oriented x3 HEENT- Right eye reactive, EOMI, non injected sclera, pink conjunctiva, MMM, oropharynx clear Neck- Supple, no thyromegaly CVS- RRR, no murmur RESP-CTAB ABD-NABS,soft,NT,ND EXT- No edema Pulses- Radial  2+        Assessment & Plan:      Problem List Items Addressed This Visit      Unprioritized   Bipolar I disorder ([place])    Reviewed her recent psychiatric note, overall doing well Had decrease in one of her anti-psychotic medications There was mention of going to [facility] Will reach out to her psychiatrist about this       HTN (hypertension)    Well controlled no changes      Loss of weight    Concerned about her weight loss, she is not eating enough Some of this was definitely behavioral based on previous notes Discussed  drinking her ensure regularly, eating her meals States she is willing to eat now she is back in new house Will f/u endocrinology tomorrow ensure thyroid not contributing Recheck weight in 4 weeks Will not add any new meds such as remeron in setting of her other psychiatric medications       Other Visit Diagnoses    Moderate protein-calorie malnutrition ([place])    -  Primary      Note: This dictation was prepared with Dragon dictation along with smaller phrase technology. Any transcriptional errors that result from this process are unintentional.

## 2017-10-04 ENCOUNTER — Encounter: Payer: Self-pay | Admitting: Family Medicine

## 2017-10-04 ENCOUNTER — Ambulatory Visit: Payer: Medicare Other | Admitting: "Endocrinology

## 2017-10-04 DIAGNOSIS — E0590 Thyrotoxicosis, unspecified without thyrotoxic crisis or storm: Secondary | ICD-10-CM | POA: Diagnosis not present

## 2017-10-04 NOTE — Assessment & Plan Note (Signed)
Reviewed her recent psychiatric note, overall doing well Had decrease in one of her anti-psychotic medications There was mention of going to [facility]?? Will reach out to her psychiatrist about this

## 2017-10-04 NOTE — Assessment & Plan Note (Signed)
Well controlled no changes 

## 2017-10-04 NOTE — Assessment & Plan Note (Signed)
Concerned about her weight loss, she is not eating enough Some of this was definitely behavioral based on previous notes Discussed drinking her ensure regularly, eating her meals States she is willing to eat now she is back in new house Will f/u endocrinology tomorrow ensure thyroid not contributing Recheck weight in 4 weeks Will not add any new meds such as remeron in setting of her other psychiatric medications

## 2017-10-10 ENCOUNTER — Encounter: Payer: Self-pay | Admitting: Family Medicine

## 2017-10-25 ENCOUNTER — Ambulatory Visit: Payer: Medicare Other | Admitting: "Endocrinology

## 2017-10-25 DIAGNOSIS — E0590 Thyrotoxicosis, unspecified without thyrotoxic crisis or storm: Secondary | ICD-10-CM | POA: Diagnosis not present

## 2017-10-26 LAB — TSH: TSH: 1.84 m[IU]/L (ref 0.40–4.50)

## 2017-10-26 LAB — T4, FREE: Free T4: 1 ng/dL (ref 0.8–1.8)

## 2017-11-06 ENCOUNTER — Ambulatory Visit: Payer: Medicare Other | Admitting: Family Medicine

## 2017-11-07 ENCOUNTER — Other Ambulatory Visit: Payer: Self-pay | Admitting: Family Medicine

## 2017-11-09 ENCOUNTER — Ambulatory Visit: Payer: Medicare Other | Admitting: Family Medicine

## 2017-11-16 ENCOUNTER — Ambulatory Visit (INDEPENDENT_AMBULATORY_CARE_PROVIDER_SITE_OTHER): Payer: Medicare Other | Admitting: Family Medicine

## 2017-11-16 ENCOUNTER — Encounter: Payer: Self-pay | Admitting: Family Medicine

## 2017-11-16 ENCOUNTER — Other Ambulatory Visit: Payer: Self-pay

## 2017-11-16 VITALS — BP 138/82 | HR 60 | Temp 98.5°F | Resp 16 | Ht 60.0 in | Wt 131.0 lb

## 2017-11-16 DIAGNOSIS — E440 Moderate protein-calorie malnutrition: Secondary | ICD-10-CM | POA: Diagnosis not present

## 2017-11-16 DIAGNOSIS — Z23 Encounter for immunization: Secondary | ICD-10-CM

## 2017-11-16 NOTE — Patient Instructions (Signed)
Malnutrition Malnutrition is any condition in which nutrition is poor. There are many forms of malnutrition. A common form is having too little of one kind of nutrient (nutritional deficiency). Nutrients include proteins, minerals, carbohydrates, fats, and vitamins. They provide the body with energy and keep the body working normally. Malnutrition ranges from mild to severe. The condition affects the body's defense system (immune system). Because of this, people who are malnourished are more likely to develop health problems and get sick. What are the causes? Causes of malnutrition include:  Eating an unbalanced diet.  Eating too much of certain foods.  Eating too little.  Conditions that decrease the body's ability to use nutrients.  What increases the risk? Risk factors include:  Pregnancy and lactation. Women who are pregnant may become malnourished if they do not increase their nutrient intake. They are also susceptible to folic acid deficiency.  Increasing age. The body's ability to absorb nutrients decreases with age. This can contribute to iron, calcium, and vitamin D deficiencies.  Alcohol or drug dependency. Addiction often leads to a lifestyle in which proper nourishment is ignored. Dependency can also hurt the metabolism and the body's ability to absorb nutrients. Alcoholism is a major cause of thiamine deficiency and can lead to deficiencies of magnesium, zinc, and other vitamins.  Eating disorders, such as anorexia nervosa. People with these disorders may eat too little or too much.  Chewing or swallowing problems. People with these disorders may not eat enough.  Certain diseases, including: ? Long-lasting (chronic) diseases. Chronic diseases tend to affect the absorption of calcium, iron, and vitamins B12, A, D, E, and K. ? Liver disease. Liver disease affects the storage of vitamins A and B12. It also interferes with the metabolism of protein and energy sources. ? Kidney  disease. Kidney disease may cause deficiencies of protein, iron, and vitamin D. ? Cancer or AIDS. These diseases can cause a loss of appetite. ? Cystic fibrosis. This disease can make it difficult for the body to absorb nutrients.  Certain diets, including. ? The vegetarian diet. Vegetarians are at risk for iron deficiency. ? The vegan diet. Vegans are susceptible to vitamin B12, calcium, iron, vitamin D, and zinc deficiencies. ? The fruitarian diet. This diet can be deficient in protein, sodium, and many micronutrients. ? Many commercial "fad" diets, including those that claim to enhance well-being and reduce weight. ? Very low calorie diets.  Low income. People with a low income may have trouble paying for nutritious foods.  What are the signs or symptoms? Signs and symptoms depend on the kind of malnutrition you have. Common symptoms include:  Fatigue.  Weakness.  Dizziness.  Fainting  Weight loss.  Poor immune response.  Lack of menstruation.  Hair loss.  Poor memory.  How is this diagnosed? Malnutrition may be diagnosed by:  A medical history.  A dietary history.  A physical exam. This may include a measurement of your body mass index (BMI).  Blood tests.  How is this treated? Treatments vary depending on the cause of the malnutrition. Common treatments include:  Dietary changes.  Dietary supplements, such as vitamins and minerals.  Treatment of any underlying conditions.  Follow these instructions at home:  Eat a balanced diet.  Take dietary supplements as directed by your health care provider.  Exercise regularly. Exercising can improve appetite.  Keep all follow-up visits as directed by your health care provider. This is important. How is this prevented? Eating a well-balanced diet helps to prevent most forms   of malnutrition. Contact a health care provider if:  You have increased weakness or fatigue.  You faint.  You stop  menstruating.  You have rapid hair loss.  You have unexpected weight loss. This information is not intended to replace advice given to you by your health care provider. Make sure you discuss any questions you have with your health care provider. Document Released: 01/13/2005 Document Revised: 08/05/2015 Document Reviewed: 10/24/2013 Elsevier Interactive Patient Education  2018 [employer]  

## 2017-11-16 NOTE — Progress Notes (Signed)
Patient ID: [name], female    DOB: [birth year], 72 y.o.   MRN: [id number]  PCP: Alycia Rossetti, MD  Chief Complaint  Patient presents with  . Follow-up    weight check- is not fasting    Subjective:   [name] is a 72 y.o. female, presents to clinic with CC of calorie malnutrition and weight loss and is here for recheck of her weight following a visit from her PCP 5 to 6 weeks ago.  She has lost 20 pounds with changing living arrangements at [facility] and she was not eating very much, Dr. Buelah Manis had arranged for her to return to her prior house which she enjoyed much more.  She was also encouraged to eat all meals and use Ensure twice a day and was supposed to return in 4 weeks to get her weight rechecked.  Her PCP is unfortunately unavailable so she is here to her weight check with me today.  She states that she is Drinking 1 ensure each day and eating three meals a day with two snacks a day at 10 am and 7 pm.  States she is eating all of the meal snacks and Ensure given to her.  She states that she likes the Ensure and is able to different neighbors.  Her weight today is down 1 more pound from her last weight.  Patient presents here with friend and caretaker who provides transportation for her and knows her very well.  She does answer some questions for the patient but patient does also answer when encouraged with repeated direct questions.  Patient states she has no change to energy, no change to how far she can walk with walker, she can get around the house w/o problems.  She is not excessively sleepy, denies any weakness, her gait has not changed.  Patient previously was having pain with eating but she denies any pain with eating of the last month.  She states that she is having normal bowel movements for her which her about every 2 to 3 days.  Reports that she feels well.  Caretaker with her today states that she is at her baseline per her mental  status, ambulation and alertness and orientation.  Caretaker states that she is "savey" and "loves Dr. Buelah Manis."  No concerns noted from facility on the paperwork.   Wt Readings from Last 5 Encounters:  11/16/17 131 lb (59.4 kg)  10/03/17 132 lb (59.9 kg)  07/27/17 142 lb (64.4 kg)  06/01/17 146 lb (66.2 kg)  02/27/17 145 lb 9.6 oz (66 kg)   Caretaker/transportation friend here inquires about thyroid labs, she did take her in about 3 weeks ago to get her labs obtained, I do see these results in the chart, they are following up with endocrinologist.  Did not give results the patient but thyroid levels are within normal limits without any significant change from her last TSH  Lab Results  Component Value Date   TSH 1.84 10/25/2017   TSH 1.43 06/01/2017   TSH 0.11 (L) 07/12/2016       Patient Active Problem List   Diagnosis Date Noted  . Hypothyroidism 06/01/2017  . Gait instability 06/11/2015  . Bipolar I disorder ([place]) 01/19/2015  . Chronic insomnia 01/19/2015  . Seasonal allergies 06/19/2014  . Loss of weight 10/01/2013  . Subclinical hyperthyroidism 07/01/2013  . Ingrown toenail without infection 06/25/2013  . Neuropathy, peripheral 09/24/2012  . Bladder incontinence 04/29/2012  .  Visual disturbance 05/03/2011  . Hx of adenomatous colonic polyps 04/12/2011  . Ingrown right big toenail 04/11/2011  . Shoulder pain 04/11/2011  . Hyperlipidemia 11/24/2010  . Schizophrenia ([place]) 11/12/2010  . Diabetes mellitus ([place]) 11/10/2010  . HTN (hypertension) 11/10/2010  . Bilateral leg edema 11/10/2010     Prior to Admission medications   Medication Sig Start Date End Date Taking? Authorizing Provider  amLODipine (NORVASC) 5 MG tablet Take 1 tablet (5 mg total) by mouth daily. 05/11/17  Yes [place], Modena Nunnery, MD  aspirin EC 325 MG tablet Take 1 tablet (325 mg total) by mouth daily. 05/11/17  Yes [place], Modena Nunnery, MD  bisacodyl (DULCOLAX) 10 MG suppository Place 1 suppository (10 mg  total) rectally every three (3) days as needed for moderate constipation (if patient has not had BM x3 days). 09/05/17  Yes [place], Modena Nunnery, MD  Calcium Carbonate-Vitamin D (CALCIUM 600+D) 600-200 MG-UNIT TABS Take 1 tablet by mouth 2 (two) times daily. 05/11/17  Yes [place], Modena Nunnery, MD  carvedilol (COREG) 6.25 MG tablet TAKE 1 TABLET BY MOUTH TWICE DAILY WITH A MEAL. 05/11/17  Yes [place], Modena Nunnery, MD  cloNIDine (CATAPRES) 0.1 MG tablet TAKE 1 TABLET BY MOUTH IN THE EVENING. 06/05/17  Yes [place], Modena Nunnery, MD  clopidogrel (PLAVIX) 75 MG tablet Take 1 tablet (75 mg total) by mouth daily. 05/11/17  Yes [place], Modena Nunnery, MD  FANAPT 4 MG TABS tablet TAKE 1 TABLET BY MOUTH TWICE DAILY. 06/05/17  Yes [place], Modena Nunnery, MD  furosemide (LASIX) 20 MG tablet Take 1 tablet (20 mg total) by mouth daily. 05/11/17  Yes [place], Modena Nunnery, MD  Glucose Blood (BLOOD GLUCOSE TEST STRIPS) STRP Use to monitor FSBS 1x daily. Dx: E11.9. 06/19/14  Yes [place], Modena Nunnery, MD  hydrocortisone (PROCTOCORT) 1 % CREA Apply to rectal area BID PRN for pain. Please dispense rectal tip. 05/11/17  Yes [place], Modena Nunnery, MD  ketorolac (ACULAR) 0.5 % ophthalmic solution Place 1 drop into both eyes 4 (four) times daily.   Yes [provider]  latanoprost (XALATAN) 0.005 % ophthalmic solution INSTILL 1 DROP INTO RIGHT EYE AT BEDTIME. 05/11/17  Yes [place], Modena Nunnery, MD  loratadine (CLARITIN) 10 MG tablet Take 1 tablet (10 mg total) by mouth daily as needed for allergies. 05/11/17  Yes [place], Modena Nunnery, MD  methimazole (TAPAZOLE) 5 MG tablet TAKE (1) TABLET BY MOUTH ONCE DAILY. 11/08/17  Yes [place], Modena Nunnery, MD  OLANZapine (ZYPREXA) 5 MG tablet TAKE 1 TABLET BY MOUTH AT BEDTIME. 06/05/17  Yes [place], Modena Nunnery, MD  perphenazine (TRILAFON) 2 MG tablet TAKE 1 TABLET BY MOUTH TWICE DAILY. 06/22/17  Yes [place], Modena Nunnery, MD  polyethylene glycol powder (GLYCOLAX/MIRALAX) powder Take 17 g by mouth 2 (two) times daily as needed. 09/04/17  Yes [place],  Modena Nunnery, MD  pravastatin (PRAVACHOL) 20 MG tablet Take 1 tablet (20 mg total) by mouth every evening. 05/11/17  Yes [place], Modena Nunnery, MD  temazepam (RESTORIL) 30 MG capsule Take 1 capsule (30 mg total) by mouth at bedtime as needed for sleep. 05/11/17  Yes [place], Modena Nunnery, MD  timolol (TIMOPTIC) 0.5 % ophthalmic solution INSTILL 1 DROP INTO RIGHT EYE EVERY MORNING. 05/11/17  Yes , Modena Nunnery, MD  topiramate (TOPAMAX) 50 MG tablet TAKE 1 TABLET BY MOUTH 3 TIMES DAILY. 08/20/17  Yes , Modena Nunnery, MD  traMADol (ULTRAM) 50 MG tablet Take 1 tablet (50 mg total) by mouth every 12 (twelve) hours as needed.  05/08/17  Yes [place], Modena Nunnery, MD     Allergies  Allergen Reactions  . Codeine      Family History  Problem Relation Age of Onset  . Other Unknown        unknown  . Thyroid disease Daughter        Partial thyroid removal/tumor      Social History   Socioeconomic History  . Marital status: Divorced    Spouse name: Not on file  . Number of children: Not on file  . Years of education: Not on file  . Highest education level: Not on file  Occupational History  . Not on file  Social Needs  . Financial resource strain: Not on file  . Food insecurity:    Worry: Not on file    Inability: Not on file  . Transportation needs:    Medical: Not on file    Non-medical: Not on file  Tobacco Use  . Smoking status: Never Smoker  . Smokeless tobacco: Never Used  Substance and Sexual Activity  . Alcohol use: No  . Drug use: No  . Sexual activity: Not on file  Lifestyle  . Physical activity:    Days per week: Not on file    Minutes per session: Not on file  . Stress: Not on file  Relationships  . Social connections:    Talks on phone: Not on file    Gets together: Not on file    Attends religious service: Not on file    Active member of club or organization: Not on file    Attends meetings of clubs or organizations: Not on file    Relationship status: Not on file  .  Intimate partner violence:    Fear of current or ex partner: Not on file    Emotionally abused: Not on file    Physically abused: Not on file    Forced sexual activity: Not on file  Other Topics Concern  . Not on file  Social History Narrative   Lives in [facility]   She used to work as a Education officer, museum.    Divorced, married twice, has two children     Review of Systems  Constitutional: Negative for activity change, appetite change and fatigue.  HENT: Negative.   Eyes: Negative.   Respiratory: Negative.   Cardiovascular: Negative.   Gastrointestinal: Negative.   Endocrine: Negative.   Genitourinary: Negative.   Musculoskeletal: Negative.   Skin: Negative.   Neurological: Negative.  Negative for syncope and weakness.  Hematological: Negative.   Psychiatric/Behavioral: Negative.        Objective:    Vitals:   11/16/17 1026  BP: 138/82  Pulse: 60  Resp: 16  Temp: 98.5 F (36.9 C)  TempSrc: Oral  SpO2: 100%  Weight: 131 lb (59.4 kg)  Height: 5' (1.524 m)      Physical Exam  Constitutional: She appears well-developed and well-nourished. No distress.  Elderly female, appears slightly older than stated age, seated in chair in exam room, slouched forward, alert  HENT:  Head: Normocephalic and atraumatic.  MMM  Neck: Normal range of motion. No tracheal deviation present.  Cardiovascular: Normal rate, regular rhythm, normal heart sounds and intact distal pulses. Exam reveals no gallop and no friction rub.  No murmur heard. Pulmonary/Chest: Effort normal and breath sounds normal. No stridor. No respiratory distress.  Abdominal: Soft. Bowel sounds are normal. She exhibits no distension. There is no tenderness.  Neurological: She  is alert. She displays no tremor. She displays no seizure activity. Gait abnormal.  Shuffled gait with very small steps using walker Answers questions appropriately with short sentences or 1-2 word answers  Skin: Skin is warm and  dry. No rash noted. She is not diaphoretic.  Psychiatric: She has a normal mood and affect. Her behavior is normal.  Nursing note and vitals reviewed.         Assessment & Plan:      ICD-10-CM   1. Moderate protein-calorie malnutrition (HCC) E44.0   2. Need for immunization against influenza Z23 Flu Vaccine QUAD 36+ mos IM    Patient here for moderate protein calorie malnutrition and weight loss of 20 pounds, here for recheck of weight, she has lost 1 additional pound.  She does endorse drinking 1 Ensure a day and eating all of her meals, she has moved back into her prior residence which she is much more comfortable with.  She denies any abdominal pain which she was previously having.  I reviewed Dr. Dorian Heckle documentation of last visit and she was instructed to do 2 ensures a day and I did discuss this with her and wrote it down on her paperwork for this facility that she should try to have 2 ensures daily and continue to eat all her other meals snacks.  She appears well-nourished and does not have any cachexia.  Denies any symptoms of malnutrition including denying fatigue, weakness, syncope, excessive sleeping.  Patient is not known to me, however there is a caretaker with her who knows her well and states that she is at her baseline and is doing well.  Discussed following up in 4 weeks to recheck that she has maintained weight.  Will need repeated visits if pt looses weight.  Plan reviewed with pt and she verbalized understanding, she also states that she will drink two ensure a day.  Patient was also due for flu vaccine and this was administered to her today.  Delsa Grana, PA-C 11/16/17 11:03 AM

## 2017-11-19 ENCOUNTER — Encounter: Payer: Self-pay | Admitting: "Endocrinology

## 2017-11-19 ENCOUNTER — Ambulatory Visit (INDEPENDENT_AMBULATORY_CARE_PROVIDER_SITE_OTHER): Payer: Medicare Other | Admitting: "Endocrinology

## 2017-11-19 VITALS — BP 118/71 | HR 54 | Ht 60.0 in | Wt 130.0 lb

## 2017-11-19 DIAGNOSIS — E0590 Thyrotoxicosis, unspecified without thyrotoxic crisis or storm: Secondary | ICD-10-CM | POA: Diagnosis not present

## 2017-11-19 NOTE — Progress Notes (Signed)
Endocrinology follow-up note   Subjective:    Patient ID: [name], female    DOB: October 12, 1945,    Past Medical History:  Diagnosis Date  . Adenomatous colon polyp   . Bipolar 1 disorder ([place])   . Diabetes mellitus   . Diverticula of colon Colonoscopy 2013  . Glaucoma   . Hypertension   . OSA (obstructive sleep apnea)   . Schizophrenia ([place])   . Stroke [facility])    2 TIA  . Thyroid disease 2010  . Urinary incontinence, mixed    Past Surgical History:  Procedure Laterality Date  . ABDOMINAL HYSTERECTOMY    . COLONOSCOPY  2007   pancolonic diverticulosis, tcs in 2012 due to h/o adenomatous polyps  . COLONOSCOPY  11/15/2011   Procedure: COLONOSCOPY;  Surgeon: Daneil Dolin, MD;  Location: [facility] END[facility] SUITE;  Service: Endoscopy;  Laterality: N/A;  7:30  . 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 MULTIPLE TOOTH EXTRACTIONS     Social History   Socioeconomic History  . Marital status: Divorced    Spouse name: Not on file  . Number of children: Not on file  . Years of education: Not on file  . Highest education level: Not on file  Occupational History  . Not on file  Social Needs  . Financial resource strain: Not on file  . Food insecurity:    Worry: Not on file    Inability: Not on file  . Transportation needs:    Medical: Not on file    Non-medical: Not on file  Tobacco Use  . Smoking status: Never Smoker  . Smokeless tobacco: Never Used  Substance and Sexual Activity  . Alcohol use: No  . Drug use: No  . Sexual activity: Not on file  Lifestyle  . Physical activity:    Days per week: Not on file    Minutes per session: Not on file  . Stress: Not on file  Relationships  . Social connections:    Talks on phone: Not on file    Gets together: Not on file    Attends religious service: Not on file    Active member of club or organization: Not on file    Attends meetings of clubs or organizations: Not on file    Relationship status: Not on file  Other Topics  Concern  . Not on file  Social History Narrative   Lives in [facility]   She used to work as a Education officer, museum.    Divorced, married twice, has two children   Outpatient Encounter Medications as of 11/19/2017  Medication Sig  . amLODipine (NORVASC) 5 MG tablet Take 1 tablet (5 mg total) by mouth daily.  Marland Kitchen aspirin EC 325 MG tablet Take 1 tablet (325 mg total) by mouth daily.  . bisacodyl (DULCOLAX) 10 MG suppository Place 1 suppository (10 mg total) rectally every three (3) days as needed for moderate constipation (if patient has not had BM x3 days).  . Calcium Carbonate-Vitamin D (CALCIUM 600+D) 600-200 MG-UNIT TABS Take 1 tablet by mouth 2 (two) times daily.  . carvedilol (COREG) 6.25 MG tablet TAKE 1 TABLET BY MOUTH TWICE DAILY WITH A MEAL.  . cloNIDine (CATAPRES) 0.1 MG tablet TAKE 1 TABLET BY MOUTH IN THE EVENING.  Marland Kitchen clopidogrel (PLAVIX) 75 MG tablet Take 1 tablet (75 mg total) by mouth daily.  Marland Kitchen FANAPT 4 MG TABS tablet TAKE 1 TABLET BY MOUTH TWICE  DAILY.  . furosemide (LASIX) 20 MG tablet Take 1 tablet (20 mg total) by mouth daily.  . Glucose Blood (BLOOD GLUCOSE TEST STRIPS) STRP Use to monitor FSBS 1x daily. Dx: E11.9.  Marland Kitchen hydrocortisone (PROCTOCORT) 1 % CREA Apply to rectal area BID PRN for pain. Please dispense rectal tip.  Marland Kitchen ketorolac (ACULAR) 0.5 % ophthalmic solution Place 1 drop into both eyes 4 (four) times daily.  Marland Kitchen latanoprost (XALATAN) 0.005 % ophthalmic solution INSTILL 1 DROP INTO RIGHT EYE AT BEDTIME.  Marland Kitchen loratadine (CLARITIN) 10 MG tablet Take 1 tablet (10 mg total) by mouth daily as needed for allergies.  Marland Kitchen OLANZapine (ZYPREXA) 5 MG tablet TAKE 1 TABLET BY MOUTH AT BEDTIME.  Marland Kitchen perphenazine (TRILAFON) 2 MG tablet TAKE 1 TABLET BY MOUTH TWICE DAILY.  Marland Kitchen polyethylene glycol powder (GLYCOLAX/MIRALAX) powder Take 17 g by mouth 2 (two) times daily as needed.  . pravastatin (PRAVACHOL) 20 MG tablet Take 1 tablet (20 mg total) by mouth every evening.  . temazepam  (RESTORIL) 30 MG capsule Take 1 capsule (30 mg total) by mouth at bedtime as needed for sleep.  Marland Kitchen timolol (TIMOPTIC) 0.5 % ophthalmic solution INSTILL 1 DROP INTO RIGHT EYE EVERY MORNING.  Marland Kitchen topiramate (TOPAMAX) 50 MG tablet TAKE 1 TABLET BY MOUTH 3 TIMES DAILY.  . traMADol (ULTRAM) 50 MG tablet Take 1 tablet (50 mg total) by mouth every 12 (twelve) hours as needed.  . [DISCONTINUED] methimazole (TAPAZOLE) 5 MG tablet TAKE (1) TABLET BY MOUTH ONCE DAILY.   No facility-administered encounter medications on file as of 11/19/2017.    ALLERGIES: Allergies  Allergen Reactions  . Codeine    VACCINATION STATUS: Immunization History  Administered Date(s) Administered  . Influenza Whole 11/24/2010  . Influenza, High Dose Seasonal PF 12/29/2016  . Influenza,inj,Quad PF,6+ Mos 01/29/2013, 01/02/2014, 04/18/2016, 11/16/2017  . Influenza-Unspecified 12/19/2011, 12/25/2013  . Pneumococcal Conjugate-13 01/29/2013    HPI  72 yr old female with multiple medical problems as above. She is here to follow-up  with repeat thyroid function test.  She is on low-dose methimazole for subclinical hyperthyroidism causing unintentional weight loss.  Her thyroid function tests have stabilized, however she continues to lose weight.  She has no new complaints today.  She has a new caretaker who witnesses that her appetite is not as good as it used to be.   -Her PMD has initiated on nutritional supplements.   she is still a nursing home resident due to failure to thrive.  Patient is a poor historian. she has stable weight since last visit. - Per  her caretaker, she eats well , no complaint of palpitations   her thyroid u/s is unremarkable. Her prior thyroid uptake was 13 % on 01/14/2014 with a dominant warm nodule on left lobe.   Review of Systems   Constitutional:  + Weight loss,  no fatigue, no subjective hyperthermia/hypothermia Eyes: no blurry vision, no xerophthalmia ENT: no sore throat, no nodules  palpated in throat, no dysphagia/odynophagia, no hoarseness Skin: no rashes Neurological: no tremors/numbness/tingling/dizziness Psychiatric: no depression/anxiety   Objective:    BP 118/71   Pulse (!) 54   Ht 5' (1.524 m)   Wt 130 lb (59 kg)   BMI 25.39 kg/m   Wt Readings from Last 3 Encounters:  11/19/17 130 lb (59 kg)  11/16/17 131 lb (59.4 kg)  10/03/17 132 lb (59.9 kg)    Physical Exam  Constitutional: Stable state of mind, in NAD Eyes: PERRLA, EOMI, no exophthalmos ENT: moist  mucous membranes, no thyromegaly, no cervical lymphadenopathy  Musculoskeletal: uses her walker due to disequilibrium, dependent edema on bilateral lower extremities. Skin: moist, warm, no rashes Neurological: No tremors of outstretched hands.    Results for orders placed or performed in visit on 07/27/17  Urine Culture  Result Value Ref Range   MICRO NUMBER: [id number]    SPECIMEN QUALITY: ADEQUATE    Sample Source URINE    STATUS: FINAL    Result:      Multiple organisms present, each less than 10,000 CFU/mL. These organisms, commonly found on external and internal genitalia, are considered to be colonizers. No further testing performed.  CBC with Differential/Platelet  Result Value Ref Range   WBC 6.3 3.8 - 10.8 Thousand/uL   RBC 4.13 3.80 - 5.10 Million/uL   Hemoglobin 11.3 (L) 11.7 - 15.5 g/dL   HCT 34.1 (L) 35.0 - 45.0 %   MCV 82.6 80.0 - 100.0 fL   MCH 27.4 27.0 - 33.0 pg   MCHC 33.1 32.0 - 36.0 g/dL   RDW 13.0 11.0 - 15.0 %   Platelets 223 140 - 400 Thousand/uL   MPV 12.1 7.5 - 12.5 fL   Neutro Abs 3,749 1,500 - 7,800 cells/uL   Lymphs Abs 1,827 850 - 3,900 cells/uL   WBC mixed population 548 200 - 950 cells/uL   Eosinophils Absolute 139 15 - 500 cells/uL   Basophils Absolute 38 0 - 200 cells/uL   Neutrophils Relative % 59.5 %   Total Lymphocyte 29.0 %   Monocytes Relative 8.7 %   Eosinophils Relative 2.2 %   Basophils Relative 0.6 %  Comprehensive metabolic panel  Result  Value Ref Range   Glucose, Bld 87 65 - 99 mg/dL   BUN 22 7 - 25 mg/dL   Creat 1.50 (H) 0.60 - 0.93 mg/dL   BUN/Creatinine Ratio 15 6 - 22 (calc)   Sodium 147 (H) 135 - 146 mmol/L   Potassium 3.6 3.5 - 5.3 mmol/L   Chloride 111 (H) 98 - 110 mmol/L   CO2 27 20 - 32 mmol/L   Calcium 9.8 8.6 - 10.4 mg/dL   Total Protein 6.2 6.1 - 8.1 g/dL   Albumin 3.7 3.6 - 5.1 g/dL   Globulin 2.5 1.9 - 3.7 g/dL (calc)   AG Ratio 1.5 1.0 - 2.5 (calc)   Total Bilirubin 0.3 0.2 - 1.2 mg/dL   Alkaline phosphatase (APISO) 71 33 - 130 U/L   AST 13 10 - 35 U/L   ALT 4 (L) 6 - 29 U/L  Urinalysis, Routine w reflex microscopic  Result Value Ref Range   Color, Urine YELLOW YELLOW   APPearance CLOUDY (A) CLEAR   Specific Gravity, Urine 1.015 1.001 - 1.03   pH 5.5 5.0 - 8.0   Glucose, UA NEGATIVE NEGATIVE   Bilirubin Urine NEGATIVE NEGATIVE   Ketones, ur TRACE (A) NEGATIVE   Hgb urine dipstick NEGATIVE NEGATIVE   Protein, ur NEGATIVE NEGATIVE   Nitrite NEGATIVE NEGATIVE   Leukocytes, UA 1+ (A) NEGATIVE   WBC, UA 6-10 (A) 0 - 5 /HPF   RBC / HPF 0-2 0 - 2 /HPF   Squamous Epithelial / LPF 0-5 < OR = 5 /HPF   Bacteria, UA MANY (A) NONE SEEN /HPF   Hyaline Cast NONE SEEN NONE SEEN /LPF  Microscopic Message  Result Value Ref Range   Note     Complete Blood Count (Most recent): Lab Results  Component Value Date   WBC 6.3 07/27/2017  HGB 11.3 (L) 07/27/2017   HCT 34.1 (L) 07/27/2017   MCV 82.6 07/27/2017   PLT 223 07/27/2017   Chemistry (most recent): Lab Results  Component Value Date   NA 147 (H) 07/27/2017   K 3.6 07/27/2017   CL 111 (H) 07/27/2017   CO2 27 07/27/2017   BUN 22 07/27/2017   CREATININE 1.50 (H) 07/27/2017   Diabetic Labs (most recent): Lab Results  Component Value Date   HGBA1C 5.6 06/01/2017   HGBA1C 5.4 04/20/2016   HGBA1C 5.8 (H) 01/19/2015      Assessment & Plan:   1.Subclinical hyperthyroidism: 2. Toxic nodule.  -She has responded to treatment with low-dose  Tapazole.  I advised her to discontinue Tapazole with plan to repeat thyroid function tests in 3 months with office visit.    her prior u/s showed small nodules.  she will not need ablative therapy. She will have repeat labs in 3 months with office visit. -I encouraged one-on-one assistance during mealtimes.  I advised patient to maintain close follow up with her PCP for primary care needs. Follow up plan: Return in about 3 months (around 02/18/2018) for Follow up with Pre-visit Labs.  Glade Lloyd, MD Phone: [phone]  Fax: [phone]  -  This note was partially dictated with voice recognition software. Similar sounding words can be transcribed inadequately or may not  be corrected upon review.  11/19/2017, 12:58 PM

## 2017-12-03 NOTE — Progress Notes (Deleted)
[facility] MD/PA/NP OP Progress Note  12/03/2017 3:24 PM [name]  MRN:  [id number]  Chief Complaint:  HPI: *** Visit Diagnosis: No diagnosis found.  Past Psychiatric History: Please see initial evaluation for full details. I have reviewed the history. No updates at this time.     Past Medical History:  Past Medical History:  Diagnosis Date  . Adenomatous colon polyp   . Bipolar 1 disorder ([place])   . Diabetes mellitus   . Diverticula of colon Colonoscopy 2013  . Glaucoma   . Hypertension   . OSA (obstructive sleep apnea)   . Schizophrenia ([place])   . Stroke [facility])    2 TIA  . Thyroid disease 2010  . Urinary incontinence, mixed     Past Surgical History:  Procedure Laterality Date  . ABDOMINAL HYSTERECTOMY    . COLONOSCOPY  2007   pancolonic diverticulosis, tcs in 2012 due to h/o adenomatous polyps  . COLONOSCOPY  11/15/2011   Procedure: COLONOSCOPY;  Surgeon: Daneil Dolin, MD;  Location: [facility] ENDO SUITE;  Service: Endoscopy;  Laterality: N/A;  7:30  . 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 MULTIPLE TOOTH EXTRACTIONS      Family Psychiatric History: Please see initial evaluation for full details. I have reviewed the history. No updates at this time.     Family History:  Family History  Problem Relation Age of Onset  . Other Unknown        unknown  . Thyroid disease Daughter        Partial thyroid removal/tumor     Social History:  Social History   Socioeconomic History  . Marital status: Divorced    Spouse name: Not on file  . Number of children: Not on file  . Years of education: Not on file  . Highest education level: Not on file  Occupational History  . Not on file  Social Needs  . Financial resource strain: Not on file  . Food insecurity:    Worry: Not on file    Inability: Not on file  . Transportation needs:    Medical: Not on file    Non-medical: Not on file  Tobacco Use  . Smoking status: Never Smoker  . Smokeless tobacco: Never Used  Substance  and Sexual Activity  . Alcohol use: No  . Drug use: No  . Sexual activity: Not on file  Lifestyle  . Physical activity:    Days per week: Not on file    Minutes per session: Not on file  . Stress: Not on file  Relationships  . Social connections:    Talks on phone: Not on file    Gets together: Not on file    Attends religious service: Not on file    Active member of club or organization: Not on file    Attends meetings of clubs or organizations: Not on file    Relationship status: Not on file  Other Topics Concern  . Not on file  Social History Narrative   Lives in [facility]   She used to work as a Education officer, museum.    Divorced, married twice, has two children    Allergies:  Allergies  Allergen Reactions  . Codeine     Metabolic Disorder Labs: Lab Results  Component Value Date   HGBA1C 5.6 06/01/2017   MPG 114 06/01/2017   MPG 108 04/20/2016   No results found for: PROLACTIN Lab Results  Component  Value Date   CHOL 126 06/01/2017   TRIG 78 06/01/2017   HDL 54 06/01/2017   CHOLHDL 2.3 06/01/2017   VLDL 13 04/20/2016   LDLCALC 56 06/01/2017   LDLCALC 71 04/20/2016   Lab Results  Component Value Date   TSH 1.84 10/25/2017   TSH 1.43 06/01/2017    Therapeutic Level Labs: No results found for: LITHIUM Lab Results  Component Value Date   VALPROATE 53.0 01/08/2009   No components found for:  CBMZ  Current Medications: Current Outpatient Medications  Medication Sig Dispense Refill  . amLODipine (NORVASC) 5 MG tablet Take 1 tablet (5 mg total) by mouth daily. 90 tablet 2  . aspirin EC 325 MG tablet Take 1 tablet (325 mg total) by mouth daily. 30 tablet 6  . bisacodyl (DULCOLAX) 10 MG suppository Place 1 suppository (10 mg total) rectally every three (3) days as needed for moderate constipation (if patient has not had BM x3 days). 12 suppository 0  . Calcium Carbonate-Vitamin D (CALCIUM 600+D) 600-200 MG-UNIT TABS Take 1 tablet by mouth 2 (two)  times daily. 60 tablet 6  . carvedilol (COREG) 6.25 MG tablet TAKE 1 TABLET BY MOUTH TWICE DAILY WITH A MEAL. 60 tablet 6  . cloNIDine (CATAPRES) 0.1 MG tablet TAKE 1 TABLET BY MOUTH IN THE EVENING. 30 tablet 11  . clopidogrel (PLAVIX) 75 MG tablet Take 1 tablet (75 mg total) by mouth daily. 30 tablet 6  . FANAPT 4 MG TABS tablet TAKE 1 TABLET BY MOUTH TWICE DAILY. 60 tablet 11  . furosemide (LASIX) 20 MG tablet Take 1 tablet (20 mg total) by mouth daily. 30 tablet 6  . Glucose Blood (BLOOD GLUCOSE TEST STRIPS) STRP Use to monitor FSBS 1x daily. Dx: E11.9. 50 each 11  . hydrocortisone (PROCTOCORT) 1 % CREA Apply to rectal area BID PRN for pain. Please dispense rectal tip. 1 Tube 3  . ketorolac (ACULAR) 0.5 % ophthalmic solution Place 1 drop into both eyes 4 (four) times daily.    [place] Kitchen latanoprost (XALATAN) 0.005 % ophthalmic solution INSTILL 1 DROP INTO RIGHT EYE AT BEDTIME. 2.5 mL 6  . loratadine (CLARITIN) 10 MG tablet Take 1 tablet (10 mg total) by mouth daily as needed for allergies. 30 tablet 6  . OLANZapine (ZYPREXA) 5 MG tablet TAKE 1 TABLET BY MOUTH AT BEDTIME. 30 tablet 11  . perphenazine (TRILAFON) 2 MG tablet TAKE 1 TABLET BY MOUTH TWICE DAILY. 93 tablet 5  . polyethylene glycol powder (GLYCOLAX/MIRALAX) powder Take 17 g by mouth 2 (two) times daily as needed. 3350 g 1  . pravastatin (PRAVACHOL) 20 MG tablet Take 1 tablet (20 mg total) by mouth every evening. 30 tablet 6  . temazepam (RESTORIL) 30 MG capsule Take 1 capsule (30 mg total) by mouth at bedtime as needed for sleep. 30 capsule 3  . timolol (TIMOPTIC) 0.5 % ophthalmic solution INSTILL 1 DROP INTO RIGHT EYE EVERY MORNING. 10 mL 11  . topiramate (TOPAMAX) 50 MG tablet TAKE 1 TABLET BY MOUTH 3 TIMES DAILY. 90 tablet 11  . traMADol (ULTRAM) 50 MG tablet Take 1 tablet (50 mg total) by mouth every 12 (twelve) hours as needed. 30 tablet 0   No current facility-administered medications for this visit.      Musculoskeletal: Strength  & Muscle Tone: within normal limits Gait & Station: normal Patient leans: N/A  Psychiatric Specialty Exam: ROS  There were no vitals taken for this visit.There is no height or weight on file to  calculate BMI.  General Appearance: Fairly Groomed  Eye Contact:  Fair  Speech:  Clear and Coherent  Volume:  Normal  Mood:  {BHH MOOD:22306}  Affect:  {Affect (PAA):22687}  Thought Process:  Coherent  Orientation:  Full (Time, Place, and Person)  Thought Content: Logical   Suicidal Thoughts:  {ST/HT (PAA):22692}  Homicidal Thoughts:  {ST/HT (PAA):22692}  Memory:  Immediate;   Good  Judgement:  {Judgement (PAA):22694}  Insight:  {Insight (PAA):22695}  Psychomotor Activity:  Normal  Concentration:  Concentration: Good and Attention Span: Good  Recall:  Good  Fund of Knowledge: Good  Language: Good  Akathisia:  No  Handed:  Right  AIMS (if indicated): not done  Assets:  Communication Skills Desire for Improvement  ADL's:  Intact  Cognition: WNL  Sleep:  {BHH GOOD/FAIR/POOR:22877}   Screenings: PHQ2-9     Office Visit from 11/16/2017 in [facility] Office Visit from 06/01/2017 in [facility] Office Visit from 11/28/2016 in [facility] Office Visit from 01/21/2016 in [facility] Office Visit from 07/21/2015 in [facility]  PHQ-2 Total Score  0  0  0  0  0  PHQ-9 Total Score  -  0  -  -  -      Mini Cog- 2/3, Clock 1/3 (misplaced numbers, clock hands), oriented x 5 except "July"  09/04/17  Assessment and Plan:  [name] is a 72 y.o. year old female with a history of bipolar I disorder, subclinical hyperthyroidism,  hypertension, diabetes, TIA, who presents for follow up appointment for No diagnosis found.  # Bipolar I disorder # r/o schizoaffective disorder Patient continues to demonstrate linear thought process, although her speech is not spontaneous and she does have difficulty  in articulation. The staff denies any behavior issues since one of the staff is replaced. Will taper down perphenazine to avoid polypharmacy. Will continue fanapt, olanzapine to target bipolar disorder. Discussed risks, including, but not limited to metabolic side effect and EPS.  Plan  1. Continue fanapt 4 mg BID 2. Decrease perphenazine 1 mg twice a day  3. Continue olanzapine 5 mg qhs  4.Return to clinic in three months for 15 mins (she is on topiramate 50 mg TID, clonidine 0.1 mg qpm, temazepam 30 mg qhs)  The patient demonstrates the following risk factors for suicide: Chronic risk factors for suicide include:psychiatric disorder ofbipolar Idisorder. Acute risk factorsfor suicide include: unemployment. Protective factorsfor this patient include: positive social support and hope for the future. Considering these factors, the overall suicide risk at this point appears to below. Patientisappropriate for outpatient follow up.  Norman Clay, MD 12/03/2017, 3:24 PM

## 2017-12-05 ENCOUNTER — Ambulatory Visit (HOSPITAL_COMMUNITY): Payer: Self-pay | Admitting: Psychiatry

## 2017-12-06 ENCOUNTER — Ambulatory Visit (HOSPITAL_COMMUNITY): Payer: Medicare Other | Admitting: Psychiatry

## 2017-12-13 ENCOUNTER — Other Ambulatory Visit: Payer: Self-pay | Admitting: Family Medicine

## 2017-12-18 ENCOUNTER — Ambulatory Visit: Payer: Medicare Other | Admitting: Family Medicine

## 2017-12-19 ENCOUNTER — Telehealth: Payer: Self-pay | Admitting: *Deleted

## 2017-12-19 DIAGNOSIS — L89152 Pressure ulcer of sacral region, stage 2: Secondary | ICD-10-CM

## 2017-12-19 DIAGNOSIS — E440 Moderate protein-calorie malnutrition: Secondary | ICD-10-CM

## 2017-12-19 NOTE — Telephone Encounter (Signed)
Received call from Jackolyn Confer with [facility] [phone] telephone.   Reports that patient has new open area to buttocks. Requested to resume [facility] services. Orders placed, but advised patient needs to be seen to F/U.   Appointment scheduled.

## 2017-12-20 NOTE — Telephone Encounter (Signed)
Agree, she missed f/up appt for weightloss/protein calorie malnutrition, and new sores are concerning and need to be checked in clinic.  Agree with HH orders, thank you

## 2017-12-21 ENCOUNTER — Encounter: Payer: Self-pay | Admitting: Family Medicine

## 2017-12-21 ENCOUNTER — Ambulatory Visit (INDEPENDENT_AMBULATORY_CARE_PROVIDER_SITE_OTHER): Payer: Medicare Other | Admitting: Family Medicine

## 2017-12-21 ENCOUNTER — Other Ambulatory Visit: Payer: Self-pay

## 2017-12-21 VITALS — BP 126/62 | HR 56 | Temp 97.7°F | Resp 14 | Ht 60.0 in | Wt 128.0 lb

## 2017-12-21 DIAGNOSIS — L89152 Pressure ulcer of sacral region, stage 2: Secondary | ICD-10-CM | POA: Diagnosis not present

## 2017-12-21 DIAGNOSIS — E440 Moderate protein-calorie malnutrition: Secondary | ICD-10-CM | POA: Diagnosis not present

## 2017-12-21 DIAGNOSIS — Z23 Encounter for immunization: Secondary | ICD-10-CM | POA: Diagnosis not present

## 2017-12-21 NOTE — Progress Notes (Signed)
Patient ID: [name], female    DOB: 04-30-1945, 72 y.o.   MRN: [id number]  PCP: Alycia Rossetti, MD  Chief Complaint  Patient presents with  . Skin Ulcer    buttocks    Subjective:   [name] is a 72 y.o. female, presents to clinic with CC of moderate protein calorie malnutrition and new/recurrent skin sores on her buttocks.   Returns for weight check, was supposted to return in 4 weeks, but did not after last visit and we were contacted by nursing home with reports of pressure ulcers developing to her bottom.  HH was ordered and they were asked to return to clinic.    Unfortunately she has lost weight, -2lbs since last visit, total of -14 lbs since May 2019, 5 months ago.   Wt Readings from Last 5 Encounters:  12/21/17 128 lb (58.1 kg)  11/19/17 130 lb (59 kg)  11/16/17 131 lb (59.4 kg)  10/03/17 132 lb (59.9 kg)  07/27/17 142 lb (64.4 kg)   Pt and her transportation companion [name] - has watched the pt and she is walking faster and stronger,  Walking up and down the hallways much more than before, much faster and stronger  She is drinking 2 ensure a day, not finishing everything on her plate at all meals.   She generally feels well she denies any abdominal pain with eating, nausea.     Patient Active Problem List   Diagnosis Date Noted  . Hypothyroidism 06/01/2017  . Gait instability 06/11/2015  . Bipolar I disorder ([place]) 01/19/2015  . Chronic insomnia 01/19/2015  . Seasonal allergies 06/19/2014  . Loss of weight 10/01/2013  . Subclinical hyperthyroidism 07/01/2013  . Ingrown toenail without infection 06/25/2013  . Neuropathy, peripheral 09/24/2012  . Bladder incontinence 04/29/2012  . Visual disturbance 05/03/2011  . Hx of adenomatous colonic polyps 04/12/2011  . Ingrown right big toenail 04/11/2011  . Shoulder pain 04/11/2011  . Hyperlipidemia 11/24/2010  . Schizophrenia ([place]) 11/12/2010  . Diabetes mellitus ([place]) 11/10/2010  . HTN  (hypertension) 11/10/2010  . Bilateral leg edema 11/10/2010     Prior to Admission medications   Medication Sig Start Date End Date Taking? Authorizing Provider  amLODipine (NORVASC) 5 MG tablet Take 1 tablet (5 mg total) by mouth daily. 05/11/17  Yes [place], Modena Nunnery, MD  aspirin EC 325 MG tablet TAKE (1) TABLET BY MOUTH ONCE DAILY. 12/14/17  Yes [place], Modena Nunnery, MD  bisacodyl (DULCOLAX) 10 MG suppository Place 1 suppository (10 mg total) rectally every three (3) days as needed for moderate constipation (if patient has not had BM x3 days). 09/05/17  Yes [place], Modena Nunnery, MD  Calcium-Vitamin D 600-200 MG-UNIT tablet TAKE 1 TABLET BY MOUTH TWICE DAILY. 12/14/17  Yes [place], Modena Nunnery, MD  carvedilol (COREG) 6.25 MG tablet TAKE (1) TABLET BY MOUTH TWICE A DAY WITH MEALS. 12/14/17  Yes [place], Modena Nunnery, MD  cloNIDine (CATAPRES) 0.1 MG tablet TAKE 1 TABLET BY MOUTH IN THE EVENING. 06/05/17  Yes [place], Modena Nunnery, MD  clopidogrel (PLAVIX) 75 MG tablet TAKE (1) TABLET BY MOUTH ONCE DAILY. 12/14/17  Yes [place], Modena Nunnery, MD  FANAPT 4 MG TABS tablet TAKE 1 TABLET BY MOUTH TWICE DAILY. 06/05/17  Yes [place], Modena Nunnery, MD  furosemide (LASIX) 20 MG tablet TAKE (1) TABLET BY MOUTH ONCE DAILY. 12/14/17  Yes [place], Modena Nunnery, MD  Glucose Blood (BLOOD GLUCOSE TEST STRIPS) STRP Use to monitor FSBS 1x daily. Dx:  E11.9. 06/19/14  Yes [place], Modena Nunnery, MD  hydrocortisone (PROCTOCORT) 1 % CREA Apply to rectal area BID PRN for pain. Please dispense rectal tip. 05/11/17  Yes [place], Modena Nunnery, MD  ketorolac (ACULAR) 0.5 % ophthalmic solution Place 1 drop into both eyes 4 (four) times daily.   Yes [provider]  latanoprost (XALATAN) 0.005 % ophthalmic solution INSTILL 1 DROP INTO RIGHT EYE AT BEDTIME. 05/11/17  Yes [place], Modena Nunnery, MD  loratadine (CLARITIN) 10 MG tablet Take 1 tablet (10 mg total) by mouth daily as needed for allergies. 05/11/17  Yes [place], Modena Nunnery, MD  OLANZapine (ZYPREXA) 5 MG tablet TAKE 1  TABLET BY MOUTH AT BEDTIME. 06/05/17  Yes [place], Modena Nunnery, MD  perphenazine (TRILAFON) 2 MG tablet TAKE 1 TABLET BY MOUTH TWICE DAILY. 06/22/17  Yes [place], Modena Nunnery, MD  polyethylene glycol powder (GLYCOLAX/MIRALAX) powder Take 17 g by mouth 2 (two) times daily as needed. 09/04/17  Yes [place], Modena Nunnery, MD  pravastatin (PRAVACHOL) 20 MG tablet TAKE 1 TABLET BY MOUTH IN THE EVENING. 12/14/17  Yes [place], Modena Nunnery, MD  temazepam (RESTORIL) 30 MG capsule Take 1 capsule (30 mg total) by mouth at bedtime as needed for sleep. 05/11/17  Yes [place], Modena Nunnery, MD  timolol (TIMOPTIC) 0.5 % ophthalmic solution INSTILL 1 DROP INTO RIGHT EYE EVERY MORNING. 05/11/17  Yes [place], Modena Nunnery, MD  topiramate (TOPAMAX) 50 MG tablet TAKE 1 TABLET BY MOUTH 3 TIMES DAILY. 08/20/17  Yes [place], Modena Nunnery, MD  traMADol (ULTRAM) 50 MG tablet Take 1 tablet (50 mg total) by mouth every 12 (twelve) hours as needed. 05/08/17  Yes [place], Modena Nunnery, MD     Allergies  Allergen Reactions  . Codeine      Family History  Problem Relation Age of Onset  . Other Unknown        unknown  . Thyroid disease Daughter        Partial thyroid removal/tumor      Social History   Socioeconomic History  . Marital status: Divorced    Spouse name: Not on file  . Number of children: Not on file  . Years of education: Not on file  . Highest education level: Not on file  Occupational History  . Not on file  Social Needs  . Financial resource strain: Not on file  . Food insecurity:    Worry: Not on file    Inability: Not on file  . Transportation needs:    Medical: Not on file    Non-medical: Not on file  Tobacco Use  . Smoking status: Never Smoker  . Smokeless tobacco: Never Used  Substance and Sexual Activity  . Alcohol use: No  . Drug use: No  . Sexual activity: Not on file  Lifestyle  . Physical activity:    Days per week: Not on file    Minutes per session: Not on file  . Stress: Not on file  Relationships  .  Social connections:    Talks on phone: Not on file    Gets together: Not on file    Attends religious service: Not on file    Active member of club or organization: Not on file    Attends meetings of clubs or organizations: Not on file    Relationship status: Not on file  . Intimate partner violence:    Fear of current or ex partner: Not on file    Emotionally abused: Not on file  Physically abused: Not on file    Forced sexual activity: Not on file  Other Topics Concern  . Not on file  Social History Narrative   Lives in [facility]   She used to work as a Education officer, museum.    Divorced, married twice, has two children     Review of Systems  Constitutional: Negative.   HENT: Negative.   Eyes: Negative.   Respiratory: Negative.   Cardiovascular: Negative.   Gastrointestinal: Negative.   Endocrine: Negative.   Genitourinary: Negative.   Musculoskeletal: Negative.   Skin: Negative.   Allergic/Immunologic: Negative.   Neurological: Negative.   Hematological: Negative.   Psychiatric/Behavioral: Negative.   All other systems reviewed and are negative.      Objective:    Vitals:   12/21/17 1132  BP: 126/62  Pulse: (!) 56  Resp: 14  Temp: 97.7 F (36.5 C)  TempSrc: Oral  SpO2: 100%  Weight: 128 lb (58.1 kg)  Height: 5' (1.524 m)     Physical Exam  Constitutional: She appears well-developed and well-nourished. No distress.  Elderly female, appears slightly older than stated age, seated in chair in exam room, slouched forward, alert, more easily engaged in conversation  HENT:  Head: Normocephalic and atraumatic.  Nose: Nose normal.  Mouth/Throat: Oropharynx is clear and moist.  MMM  Neck: No tracheal deviation present.  Cardiovascular: Normal rate, regular rhythm, normal heart sounds and intact distal pulses. Exam reveals no gallop and no friction rub.  No murmur heard. Pulmonary/Chest: Effort normal and breath sounds normal. No stridor. No  respiratory distress.  Abdominal: Soft. Bowel sounds are normal. She exhibits no distension. There is no tenderness.  Neurological: She is alert. She displays no tremor. She displays no seizure activity. Gait abnormal.  Short small steps with rolling walker   Skin: Skin is warm and dry. No rash noted. She is not diaphoretic.  Left buttocks 2 x 2 cm area of stage I pressure ulcer with a very small area (5x5 mm) of stage II, no drainage, no surrounding edema, induration or erythema  Psychiatric: She has a normal mood and affect. Her speech is normal and behavior is normal. Cognition and memory are normal.  Nursing note and vitals reviewed.         Assessment & Plan:      ICD-10-CM   1. Moderate protein-calorie malnutrition (HCC) E44.0   2. Pressure injury of sacral region, stage 2 ([place]) L89.152   3. Encounter for immunization Z23 Flu vaccine HIGH DOSE PF    Patient with small amount of weight loss but she is more energetic, getting up from the chair and walking faster, more engaged in conversation and her transportation aide is here reporting that she is walking a lot more around the home where she is living.  She is eating her insurance but not eating all the food given to her.  She was encouraged to continue to drink 2-3 ensures a day and try to finish her food on her plate, recheck weight in 4 weeks  Does have a new pressure ulcer to her left buttocks/sacral region with small amount area that is stage II and the remaining is stage I, home health has been ordered to help do dressings, DuoDERM patch was applied today in clinic, encouraged to adjust positioning keep her weight changed every 1-2 hours so she is not having constant pressure on that area of her buttocks.  Pt well appearing, vital signs are good, she appears  in good spirits, will recheck her in 4 weeks.  Delsa Grana, PA-C 12/21/17 11:47 AM

## 2017-12-21 NOTE — Patient Instructions (Addendum)
Continue drinking 2-3 ensure a day, and do your best to eat your food.    Recheck your weight here in 4 weeks.  Home health is ordered and will be scheduled  Duoderm patch applied to sacral ulcer, leave on for 3-4 days and replace with provided patch.  Hopefully home health will be there in one week to take over management of the sore.  Need ot reposition frequently when sitting or laying, do not lay or sit in one position for more than 2 hours, this can create and/or worsen the sores.

## 2017-12-23 DIAGNOSIS — L89322 Pressure ulcer of left buttock, stage 2: Secondary | ICD-10-CM | POA: Diagnosis not present

## 2017-12-23 DIAGNOSIS — Z4800 Encounter for change or removal of nonsurgical wound dressing: Secondary | ICD-10-CM | POA: Diagnosis not present

## 2017-12-23 DIAGNOSIS — Z79899 Other long term (current) drug therapy: Secondary | ICD-10-CM | POA: Diagnosis not present

## 2017-12-23 DIAGNOSIS — Z79891 Long term (current) use of opiate analgesic: Secondary | ICD-10-CM | POA: Diagnosis not present

## 2017-12-23 DIAGNOSIS — I10 Essential (primary) hypertension: Secondary | ICD-10-CM | POA: Diagnosis not present

## 2017-12-23 DIAGNOSIS — F209 Schizophrenia, unspecified: Secondary | ICD-10-CM | POA: Diagnosis not present

## 2017-12-23 DIAGNOSIS — E440 Moderate protein-calorie malnutrition: Secondary | ICD-10-CM | POA: Diagnosis not present

## 2017-12-23 DIAGNOSIS — F319 Bipolar disorder, unspecified: Secondary | ICD-10-CM | POA: Diagnosis not present

## 2017-12-23 DIAGNOSIS — E1142 Type 2 diabetes mellitus with diabetic polyneuropathy: Secondary | ICD-10-CM | POA: Diagnosis not present

## 2017-12-23 DIAGNOSIS — Z7982 Long term (current) use of aspirin: Secondary | ICD-10-CM | POA: Diagnosis not present

## 2017-12-23 DIAGNOSIS — Z7902 Long term (current) use of antithrombotics/antiplatelets: Secondary | ICD-10-CM | POA: Diagnosis not present

## 2017-12-27 DIAGNOSIS — I10 Essential (primary) hypertension: Secondary | ICD-10-CM | POA: Diagnosis not present

## 2017-12-27 DIAGNOSIS — E1142 Type 2 diabetes mellitus with diabetic polyneuropathy: Secondary | ICD-10-CM | POA: Diagnosis not present

## 2017-12-27 DIAGNOSIS — F319 Bipolar disorder, unspecified: Secondary | ICD-10-CM | POA: Diagnosis not present

## 2017-12-27 DIAGNOSIS — F209 Schizophrenia, unspecified: Secondary | ICD-10-CM | POA: Diagnosis not present

## 2017-12-27 DIAGNOSIS — E440 Moderate protein-calorie malnutrition: Secondary | ICD-10-CM | POA: Diagnosis not present

## 2017-12-27 DIAGNOSIS — L89322 Pressure ulcer of left buttock, stage 2: Secondary | ICD-10-CM | POA: Diagnosis not present

## 2017-12-31 DIAGNOSIS — L89322 Pressure ulcer of left buttock, stage 2: Secondary | ICD-10-CM | POA: Diagnosis not present

## 2017-12-31 DIAGNOSIS — E1142 Type 2 diabetes mellitus with diabetic polyneuropathy: Secondary | ICD-10-CM | POA: Diagnosis not present

## 2017-12-31 DIAGNOSIS — F209 Schizophrenia, unspecified: Secondary | ICD-10-CM | POA: Diagnosis not present

## 2017-12-31 DIAGNOSIS — E440 Moderate protein-calorie malnutrition: Secondary | ICD-10-CM | POA: Diagnosis not present

## 2017-12-31 DIAGNOSIS — I10 Essential (primary) hypertension: Secondary | ICD-10-CM | POA: Diagnosis not present

## 2017-12-31 DIAGNOSIS — F319 Bipolar disorder, unspecified: Secondary | ICD-10-CM | POA: Diagnosis not present

## 2018-01-03 IMAGING — CT CT RENAL STONE PROTOCOL
2 of 4 series · 16 of 46 positions shown, 18 images · non-contrast
Comparison: None.

CLINICAL DATA: Right flank pain

EXAM:
CT ABDOMEN AND PELVIS WITHOUT CONTRAST
TECHNIQUE: Multidetector CT imaging of the abdomen and pelvis was performed
following the standard protocol without IV contrast.

[Series 2: axial st · axial · 0.72mm/px · z∈[+843,+1248]mm · 13 of 89 slices shown, 15 images]
[im 4/89  soft-tissue]
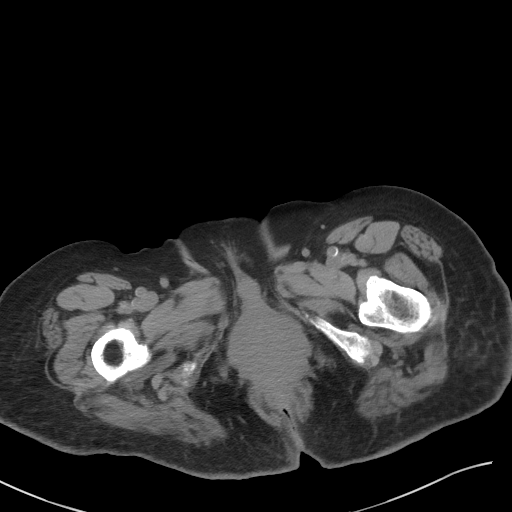
[im 4/89  bone]
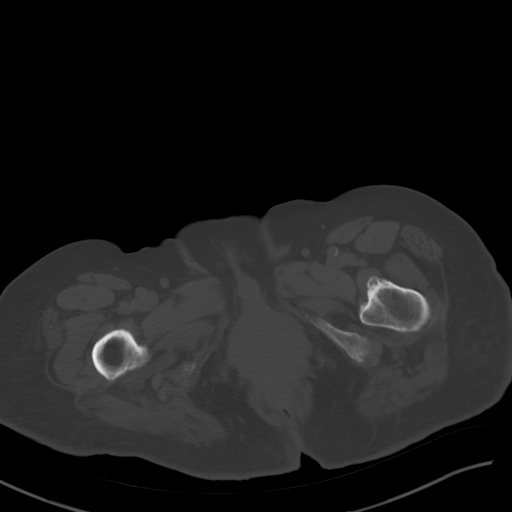
[im 12/89  soft-tissue]
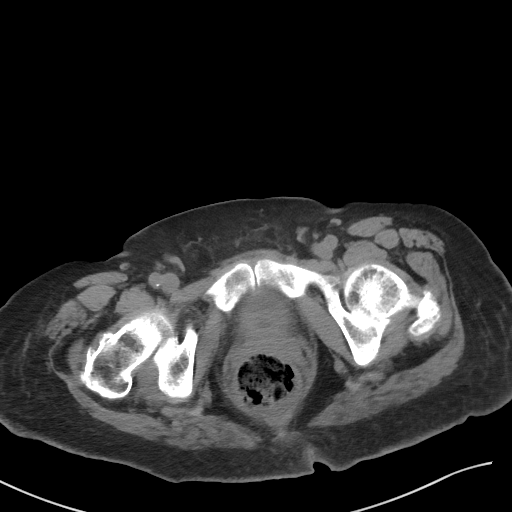
[im 19/89  soft-tissue]
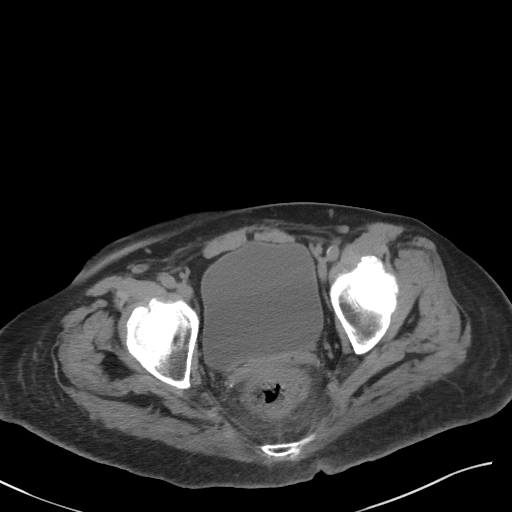
[im 26/89  soft-tissue]
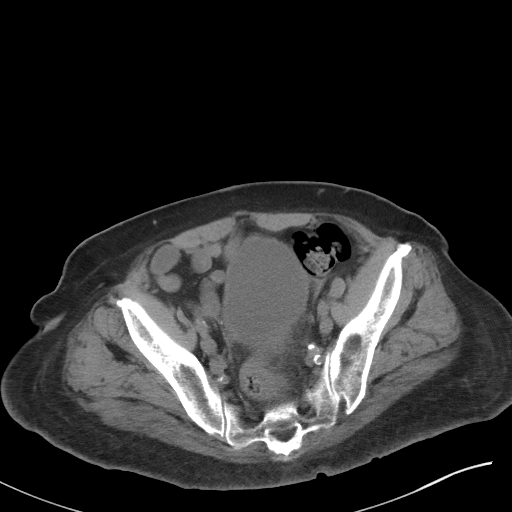
[im 30/89  soft-tissue]
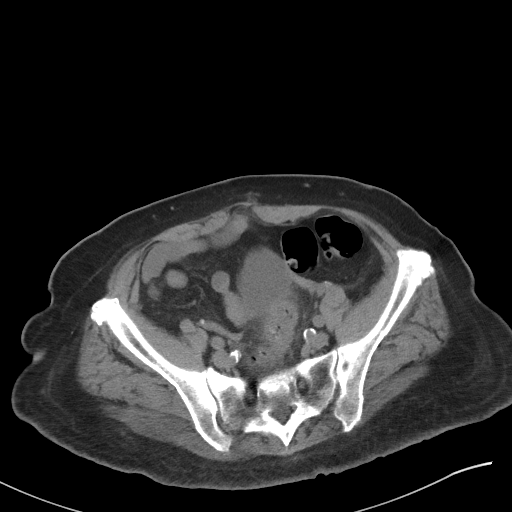
[im 37/89  soft-tissue]
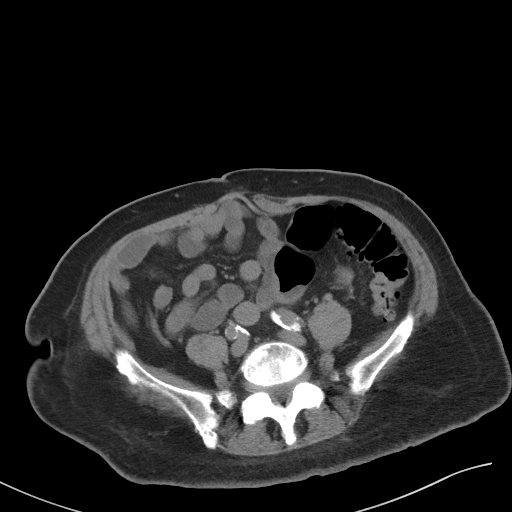
[im 45/89  soft-tissue]
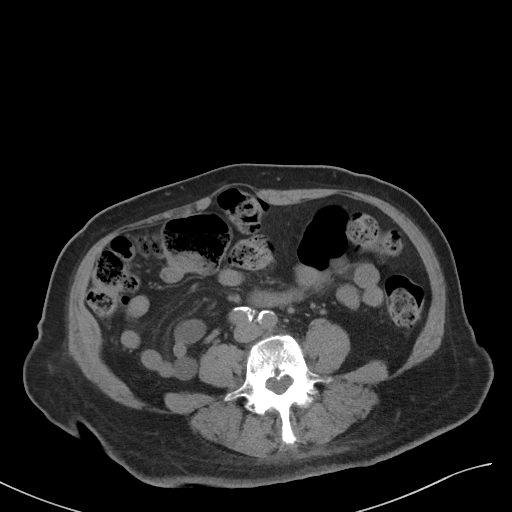
[im 52/89  soft-tissue]
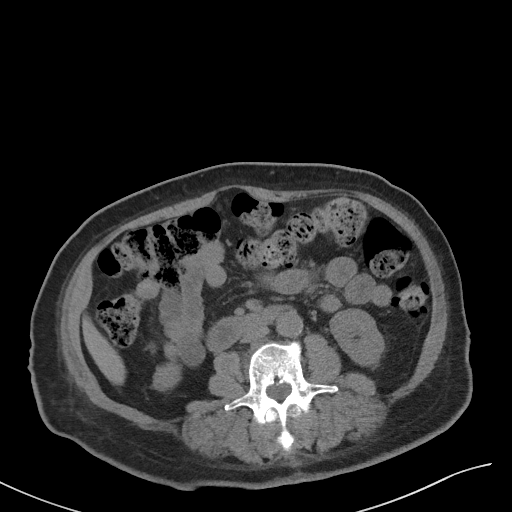
[im 59/89  soft-tissue]
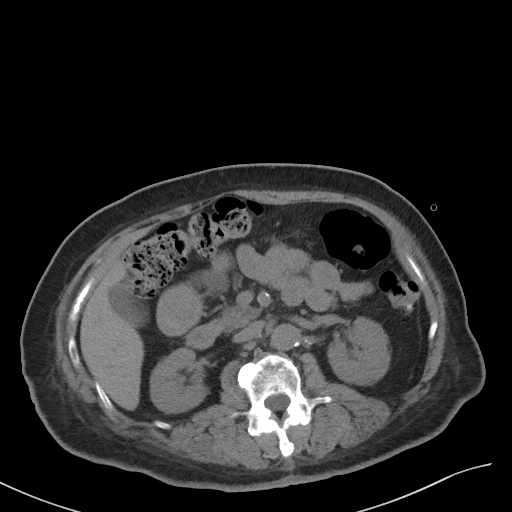
[im 59/89  bone]
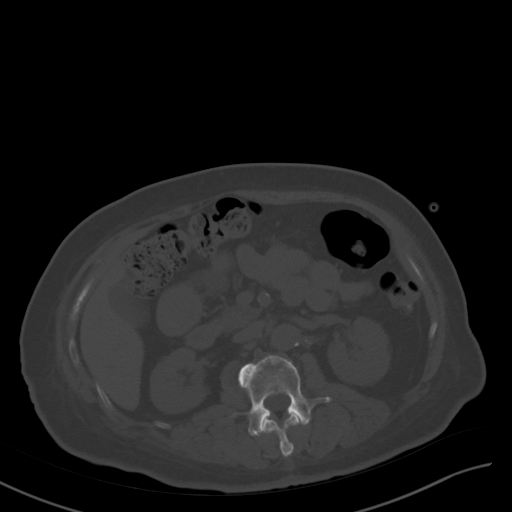
[im 63/89  soft-tissue]
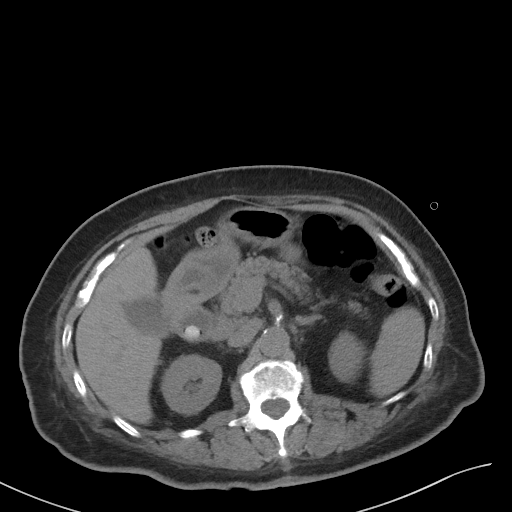
[im 70/89  soft-tissue]
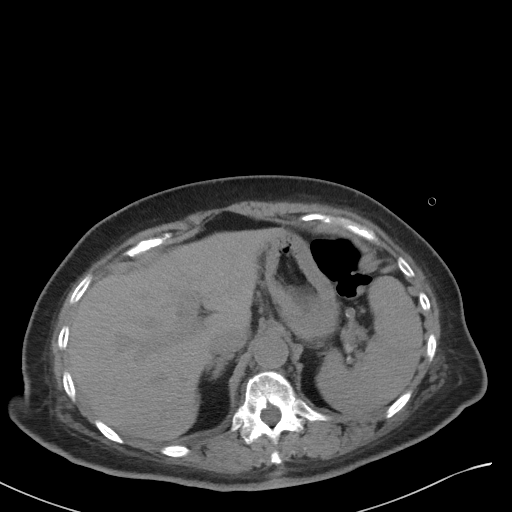
[im 78/89  soft-tissue]
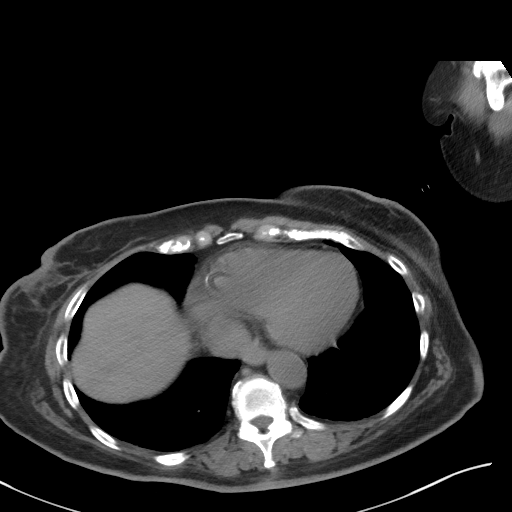
[im 85/89  soft-tissue]
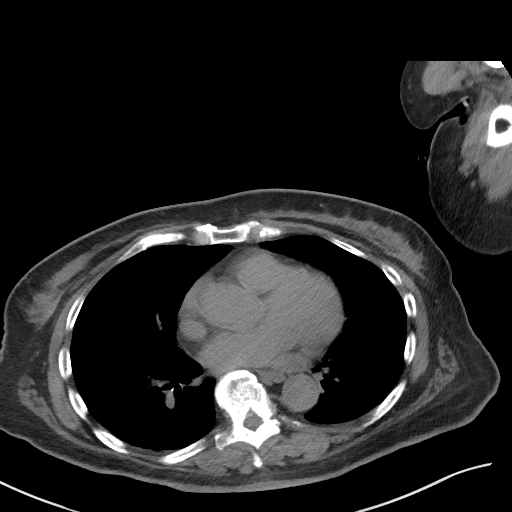

[Series 5: coronal st · coronal · 0.63mm/px · 3 of 75 slices shown]
[im 25/75  soft-tissue]
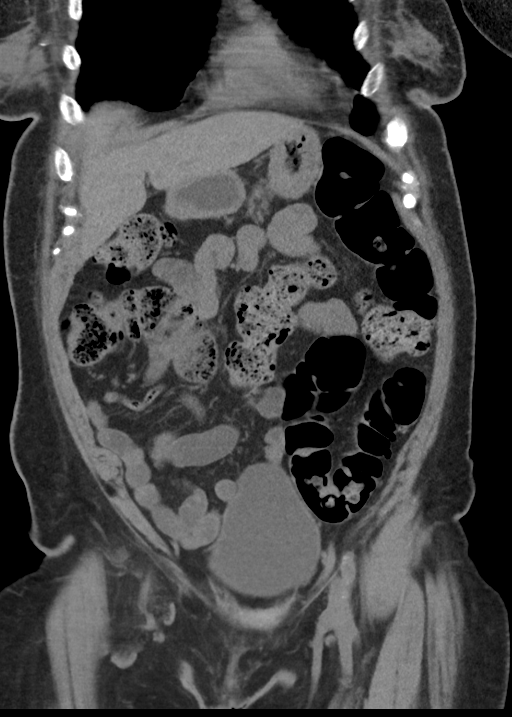
[im 33/75  soft-tissue]
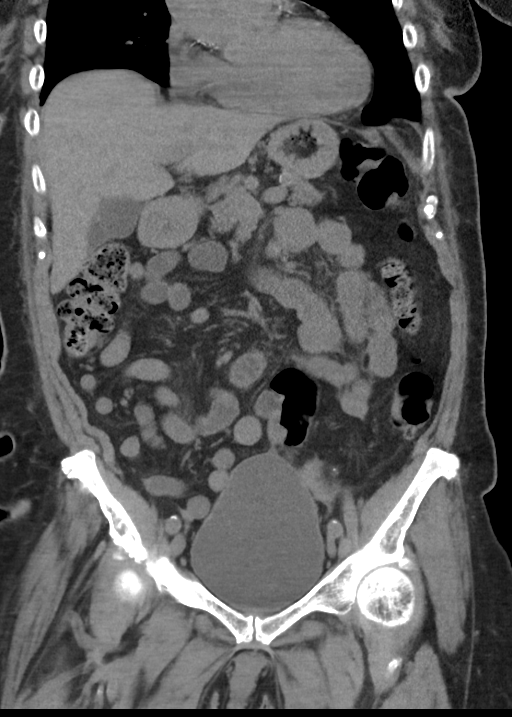
[im 42/75  soft-tissue]
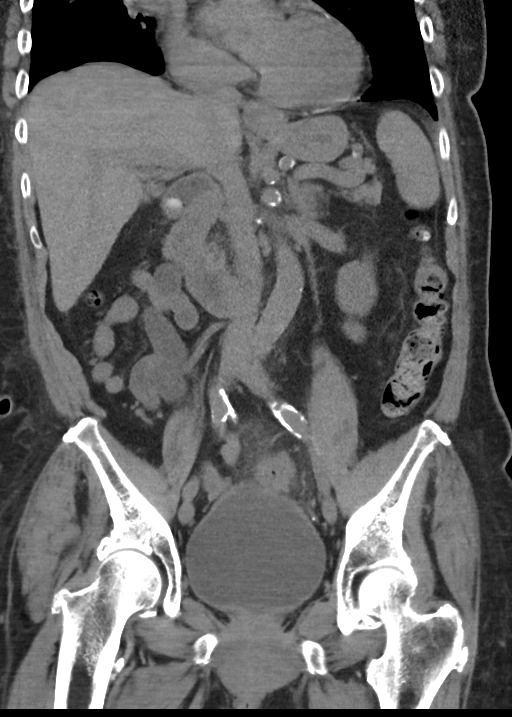

[16 of 46 positions shown; findings below may reference images not displayed]

FINDINGS: Lower chest: Lung bases clear

Hepatobiliary: Gallbladder liver and bile ducts normal.

Pancreas: Negative

Spleen: Negative

Adrenals/Urinary Tract: Tiny 1 mm nonobstructing renal calculi by
bilaterally. No ureteral calculus. Normal bladder.

Stomach/Bowel: Normal stomach. Normal small bowel. Stool throughout
the colon. Sigmoid diverticulosis. Normal appendix.

Thickening of the rectum and lower sigmoid. There is edema in the
surrounding fat. This may be due to acute inflammation.

Vascular/Lymphatic: Atherosclerotic disease without aortic aneurysm.
Negative for lymphadenopathy.

Reproductive: Hysterectomy.  No pelvic mass.

Other: No free fluid.  Negative for hernia.

Musculoskeletal: Lumbar degenerative changes. No acute skeletal
lesion.
IMPRESSION: Thickening of the rectum with edema in the surrounding fat
suggesting proctitis.

Constipation

Normal appendix

Atherosclerotic disease.

Small bilateral renal calculi without obstruction or ureteral stone.

## 2018-01-04 DIAGNOSIS — F209 Schizophrenia, unspecified: Secondary | ICD-10-CM | POA: Diagnosis not present

## 2018-01-04 DIAGNOSIS — F319 Bipolar disorder, unspecified: Secondary | ICD-10-CM | POA: Diagnosis not present

## 2018-01-04 DIAGNOSIS — E440 Moderate protein-calorie malnutrition: Secondary | ICD-10-CM | POA: Diagnosis not present

## 2018-01-04 DIAGNOSIS — E1142 Type 2 diabetes mellitus with diabetic polyneuropathy: Secondary | ICD-10-CM | POA: Diagnosis not present

## 2018-01-04 DIAGNOSIS — L89322 Pressure ulcer of left buttock, stage 2: Secondary | ICD-10-CM | POA: Diagnosis not present

## 2018-01-04 DIAGNOSIS — I10 Essential (primary) hypertension: Secondary | ICD-10-CM | POA: Diagnosis not present

## 2018-01-09 DIAGNOSIS — E1142 Type 2 diabetes mellitus with diabetic polyneuropathy: Secondary | ICD-10-CM | POA: Diagnosis not present

## 2018-01-09 DIAGNOSIS — E440 Moderate protein-calorie malnutrition: Secondary | ICD-10-CM | POA: Diagnosis not present

## 2018-01-09 DIAGNOSIS — F209 Schizophrenia, unspecified: Secondary | ICD-10-CM | POA: Diagnosis not present

## 2018-01-09 DIAGNOSIS — F319 Bipolar disorder, unspecified: Secondary | ICD-10-CM | POA: Diagnosis not present

## 2018-01-09 DIAGNOSIS — L89322 Pressure ulcer of left buttock, stage 2: Secondary | ICD-10-CM | POA: Diagnosis not present

## 2018-01-09 DIAGNOSIS — I10 Essential (primary) hypertension: Secondary | ICD-10-CM | POA: Diagnosis not present

## 2018-01-10 ENCOUNTER — Other Ambulatory Visit: Payer: Self-pay | Admitting: Family Medicine

## 2018-01-11 DIAGNOSIS — E1142 Type 2 diabetes mellitus with diabetic polyneuropathy: Secondary | ICD-10-CM | POA: Diagnosis not present

## 2018-01-11 DIAGNOSIS — F319 Bipolar disorder, unspecified: Secondary | ICD-10-CM | POA: Diagnosis not present

## 2018-01-11 DIAGNOSIS — I10 Essential (primary) hypertension: Secondary | ICD-10-CM | POA: Diagnosis not present

## 2018-01-11 DIAGNOSIS — E440 Moderate protein-calorie malnutrition: Secondary | ICD-10-CM | POA: Diagnosis not present

## 2018-01-11 DIAGNOSIS — L89322 Pressure ulcer of left buttock, stage 2: Secondary | ICD-10-CM | POA: Diagnosis not present

## 2018-01-11 DIAGNOSIS — F209 Schizophrenia, unspecified: Secondary | ICD-10-CM | POA: Diagnosis not present

## 2018-01-15 DIAGNOSIS — I10 Essential (primary) hypertension: Secondary | ICD-10-CM | POA: Diagnosis not present

## 2018-01-15 DIAGNOSIS — E440 Moderate protein-calorie malnutrition: Secondary | ICD-10-CM | POA: Diagnosis not present

## 2018-01-15 DIAGNOSIS — F319 Bipolar disorder, unspecified: Secondary | ICD-10-CM | POA: Diagnosis not present

## 2018-01-15 DIAGNOSIS — E1142 Type 2 diabetes mellitus with diabetic polyneuropathy: Secondary | ICD-10-CM | POA: Diagnosis not present

## 2018-01-15 DIAGNOSIS — F209 Schizophrenia, unspecified: Secondary | ICD-10-CM | POA: Diagnosis not present

## 2018-01-15 DIAGNOSIS — L89322 Pressure ulcer of left buttock, stage 2: Secondary | ICD-10-CM | POA: Diagnosis not present

## 2018-01-22 ENCOUNTER — Ambulatory Visit: Payer: Medicare Other | Admitting: Family Medicine

## 2018-01-22 DIAGNOSIS — E1142 Type 2 diabetes mellitus with diabetic polyneuropathy: Secondary | ICD-10-CM | POA: Diagnosis not present

## 2018-01-22 DIAGNOSIS — L89322 Pressure ulcer of left buttock, stage 2: Secondary | ICD-10-CM | POA: Diagnosis not present

## 2018-01-22 DIAGNOSIS — E440 Moderate protein-calorie malnutrition: Secondary | ICD-10-CM | POA: Diagnosis not present

## 2018-01-22 DIAGNOSIS — F209 Schizophrenia, unspecified: Secondary | ICD-10-CM | POA: Diagnosis not present

## 2018-01-22 DIAGNOSIS — F319 Bipolar disorder, unspecified: Secondary | ICD-10-CM | POA: Diagnosis not present

## 2018-01-22 DIAGNOSIS — I10 Essential (primary) hypertension: Secondary | ICD-10-CM | POA: Diagnosis not present

## 2018-01-30 ENCOUNTER — Encounter: Payer: Self-pay | Admitting: Family Medicine

## 2018-01-30 ENCOUNTER — Other Ambulatory Visit: Payer: Self-pay

## 2018-01-30 ENCOUNTER — Ambulatory Visit (INDEPENDENT_AMBULATORY_CARE_PROVIDER_SITE_OTHER): Payer: Medicare Other | Admitting: Family Medicine

## 2018-01-30 VITALS — BP 128/64 | HR 58 | Temp 98.2°F | Resp 16 | Ht 60.0 in | Wt 128.8 lb

## 2018-01-30 DIAGNOSIS — L89159 Pressure ulcer of sacral region, unspecified stage: Secondary | ICD-10-CM | POA: Diagnosis not present

## 2018-01-30 DIAGNOSIS — E440 Moderate protein-calorie malnutrition: Secondary | ICD-10-CM | POA: Diagnosis not present

## 2018-01-30 DIAGNOSIS — F319 Bipolar disorder, unspecified: Secondary | ICD-10-CM

## 2018-01-30 DIAGNOSIS — E039 Hypothyroidism, unspecified: Secondary | ICD-10-CM

## 2018-01-30 DIAGNOSIS — L89322 Pressure ulcer of left buttock, stage 2: Secondary | ICD-10-CM | POA: Diagnosis not present

## 2018-01-30 DIAGNOSIS — E119 Type 2 diabetes mellitus without complications: Secondary | ICD-10-CM

## 2018-01-30 DIAGNOSIS — Z1239 Encounter for other screening for malignant neoplasm of breast: Secondary | ICD-10-CM

## 2018-01-30 DIAGNOSIS — I10 Essential (primary) hypertension: Secondary | ICD-10-CM | POA: Diagnosis not present

## 2018-01-30 DIAGNOSIS — E1142 Type 2 diabetes mellitus with diabetic polyneuropathy: Secondary | ICD-10-CM | POA: Diagnosis not present

## 2018-01-30 DIAGNOSIS — E46 Unspecified protein-calorie malnutrition: Secondary | ICD-10-CM | POA: Insufficient documentation

## 2018-01-30 DIAGNOSIS — F209 Schizophrenia, unspecified: Secondary | ICD-10-CM | POA: Diagnosis not present

## 2018-01-30 LAB — COMPREHENSIVE METABOLIC PANEL
AG RATIO: 1.4 (calc) (ref 1.0–2.5)
ALT: 4 U/L — AB (ref 6–29)
AST: 11 U/L (ref 10–35)
Albumin: 3.6 g/dL (ref 3.6–5.1)
Alkaline phosphatase (APISO): 67 U/L (ref 33–130)
BUN / CREAT RATIO: 13 (calc) (ref 6–22)
BUN: 18 mg/dL (ref 7–25)
CO2: 27 mmol/L (ref 20–32)
CREATININE: 1.38 mg/dL — AB (ref 0.60–0.93)
Calcium: 9.2 mg/dL (ref 8.6–10.4)
Chloride: 110 mmol/L (ref 98–110)
Globulin: 2.6 g/dL (calc) (ref 1.9–3.7)
Glucose, Bld: 84 mg/dL (ref 65–99)
Potassium: 4 mmol/L (ref 3.5–5.3)
Sodium: 146 mmol/L (ref 135–146)
Total Bilirubin: 0.3 mg/dL (ref 0.2–1.2)
Total Protein: 6.2 g/dL (ref 6.1–8.1)

## 2018-01-30 LAB — CBC WITH DIFFERENTIAL/PLATELET
BASOS ABS: 28 {cells}/uL (ref 0–200)
Basophils Relative: 0.4 %
EOS ABS: 161 {cells}/uL (ref 15–500)
Eosinophils Relative: 2.3 %
HCT: 36.3 % (ref 35.0–45.0)
Hemoglobin: 11.8 g/dL (ref 11.7–15.5)
Lymphs Abs: 2373 cells/uL (ref 850–3900)
MCH: 28 pg (ref 27.0–33.0)
MCHC: 32.5 g/dL (ref 32.0–36.0)
MCV: 86 fL (ref 80.0–100.0)
MPV: 12 fL (ref 7.5–12.5)
Monocytes Relative: 9.7 %
NEUTROS PCT: 53.7 %
Neutro Abs: 3759 cells/uL (ref 1500–7800)
PLATELETS: 232 10*3/uL (ref 140–400)
RBC: 4.22 10*6/uL (ref 3.80–5.10)
RDW: 13.1 % (ref 11.0–15.0)
TOTAL LYMPHOCYTE: 33.9 %
WBC mixed population: 679 cells/uL (ref 200–950)
WBC: 7 10*3/uL (ref 3.8–10.8)

## 2018-01-30 NOTE — Patient Instructions (Addendum)
Mammogram to be arranged Call and schedule with Dr. Modesta Messing We will call with lab results Drink the ensure twice a day  Flu shot done F/U 2 months

## 2018-01-30 NOTE — Progress Notes (Addendum)
   Subjective:    Patient ID: [name], female    DOB: 1945-08-25, 72 y.o.   MRN: [id number]  Patient presents for Follow-up (is not fasting)  Pt here to f/u weight loss, she has maintained her weight over the past few months within 1-2 pounds, often does not complete her protein shakes especially when she gets upset with care givers Here today with one of the caregivers from the facility She denies any pain, no difficulty eating. She is getting skilled nursing care for sacral decub Due for labs  She would like to get mammogram  Due for f/u with her psychiatrist    Review Of Systems:  GEN- denies fatigue, fever, weight loss,weakness, recent illness HEENT- denies eye drainage, change in vision, nasal discharge, CVS- denies chest pain, palpitations RESP- denies SOB, cough, wheeze ABD- denies N/V, change in stools, abd pain GU- denies dysuria, hematuria, dribbling, incontinence MSK- denies joint pain, muscle aches, injury Neuro- denies headache, dizziness, syncope, seizure activity       Objective:    BP 128/64   Pulse (!) 58   Temp 98.2 F (36.8 C) (Oral)   Resp 16   Ht 5' (1.524 m)   Wt 128 lb 12.8 oz (58.4 kg)   SpO2 100%   BMI 25.15 kg/m  GEN- NAD, alert and oriented x3,sits slumped over- normal posture for her HEENT- Right eye reactive, EOMI, non injected sclera, pink conjunctiva, MMM, oropharynx clear Neck- Supple, no thyromegaly, no LAD CVS- RRR, no murmur RESP-CTAB ABD-NABS,soft,NT,ND EXT- No edema Pulses- Radial  2+        Assessment & Plan:      Note pt has had 2 flu shots this season, given in Sept and then October I did let her know, I dont feel she has had any adverse affects from both doses. Problem List Items Addressed This Visit      Unprioritized   Bipolar I disorder ([place]) - Primary    Facility to schedule a recheck with her psychiatrist, she missed her September appointment      Diabetes mellitus [facility])    Diet controlled ' Last  A1C 5.6% this year      HTN (hypertension)    controlled      Relevant Orders   CBC with Differential/Platelet (Completed)   Comprehensive metabolic panel (Completed)   Hypothyroidism    Followed by endocrinology      Protein-calorie malnutrition ([place])    Her weight loss has stopped which is good She is eating more regulary, but could defintely increase her protein supplements. When she gets upset at caregivers will not eat at times Her labs have been fairly unremarkable She has not had extensive cancer workup but will obtain mammogram for now No other symptoms to lead to other imaging at this time and weight has been stable       Relevant Orders   Comprehensive metabolic panel (Completed)   Sacral decubitus ulcer    Continue HH dressing changes       Other Visit Diagnoses    Breast cancer screening       Relevant Orders   MM 3D SCREEN BREAST BILATERAL      Note: This dictation was prepared with Dragon dictation along with smaller phrase technology. Any transcriptional errors that result from this process are unintentional.

## 2018-01-31 ENCOUNTER — Encounter: Payer: Self-pay | Admitting: Family Medicine

## 2018-01-31 ENCOUNTER — Encounter: Payer: Self-pay | Admitting: *Deleted

## 2018-01-31 DIAGNOSIS — L89159 Pressure ulcer of sacral region, unspecified stage: Secondary | ICD-10-CM | POA: Insufficient documentation

## 2018-01-31 NOTE — Assessment & Plan Note (Signed)
Continue HH dressing changes

## 2018-01-31 NOTE — Assessment & Plan Note (Addendum)
Her weight loss has stopped which is good She is eating more regulary, but could defintely increase her protein supplements. When she gets upset at caregivers will not eat at times Her labs have been fairly unremarkable She has not had extensive cancer workup but will obtain mammogram for now No other symptoms to lead to other imaging at this time and weight has been stable

## 2018-01-31 NOTE — Assessment & Plan Note (Signed)
Followed by endocrinology 

## 2018-01-31 NOTE — Assessment & Plan Note (Signed)
Facility to schedule a recheck with her psychiatrist, she missed her September appointment

## 2018-01-31 NOTE — Assessment & Plan Note (Signed)
controlled 

## 2018-01-31 NOTE — Assessment & Plan Note (Addendum)
Diet controlled ' Last A1C 5.6% this year

## 2018-02-04 ENCOUNTER — Encounter (HOSPITAL_COMMUNITY): Payer: Self-pay | Admitting: Emergency Medicine

## 2018-02-04 ENCOUNTER — Other Ambulatory Visit: Payer: Self-pay

## 2018-02-04 ENCOUNTER — Emergency Department (HOSPITAL_COMMUNITY)
Admission: EM | Admit: 2018-02-04 | Discharge: 2018-02-05 | Disposition: A | Discharge status: Home / Self Care | Payer: Medicare Other | Attending: Emergency Medicine | Admitting: Emergency Medicine

## 2018-02-04 DIAGNOSIS — F209 Schizophrenia, unspecified: Secondary | ICD-10-CM | POA: Diagnosis not present

## 2018-02-04 DIAGNOSIS — I10 Essential (primary) hypertension: Secondary | ICD-10-CM | POA: Insufficient documentation

## 2018-02-04 DIAGNOSIS — F3131 Bipolar disorder, current episode depressed, mild: Secondary | ICD-10-CM | POA: Insufficient documentation

## 2018-02-04 DIAGNOSIS — Z7902 Long term (current) use of antithrombotics/antiplatelets: Secondary | ICD-10-CM | POA: Insufficient documentation

## 2018-02-04 DIAGNOSIS — F329 Major depressive disorder, single episode, unspecified: Secondary | ICD-10-CM

## 2018-02-04 DIAGNOSIS — Z79899 Other long term (current) drug therapy: Secondary | ICD-10-CM | POA: Insufficient documentation

## 2018-02-04 DIAGNOSIS — E039 Hypothyroidism, unspecified: Secondary | ICD-10-CM | POA: Diagnosis not present

## 2018-02-04 DIAGNOSIS — Z7982 Long term (current) use of aspirin: Secondary | ICD-10-CM | POA: Diagnosis not present

## 2018-02-04 DIAGNOSIS — Z8673 Personal history of transient ischemic attack (TIA), and cerebral infarction without residual deficits: Secondary | ICD-10-CM | POA: Insufficient documentation

## 2018-02-04 DIAGNOSIS — F32A Depression, unspecified: Secondary | ICD-10-CM

## 2018-02-04 LAB — URINALYSIS, ROUTINE W REFLEX MICROSCOPIC
BILIRUBIN URINE: NEGATIVE
GLUCOSE, UA: NEGATIVE mg/dL
Hgb urine dipstick: NEGATIVE
Ketones, ur: 5 mg/dL — AB
NITRITE: NEGATIVE
PH: 6 (ref 5.0–8.0)
Protein, ur: NEGATIVE mg/dL
SPECIFIC GRAVITY, URINE: 1.012 (ref 1.005–1.030)

## 2018-02-04 LAB — CBC WITH DIFFERENTIAL/PLATELET
ABS IMMATURE GRANULOCYTES: 0.02 10*3/uL (ref 0.00–0.07)
Basophils Absolute: 0 10*3/uL (ref 0.0–0.1)
Basophils Relative: 1 %
Eosinophils Absolute: 0.2 10*3/uL (ref 0.0–0.5)
Eosinophils Relative: 2 %
HEMATOCRIT: 40.4 % (ref 36.0–46.0)
HEMOGLOBIN: 12.3 g/dL (ref 12.0–15.0)
Immature Granulocytes: 0 %
LYMPHS PCT: 24 %
Lymphs Abs: 1.6 10*3/uL (ref 0.7–4.0)
MCH: 27 pg (ref 26.0–34.0)
MCHC: 30.4 g/dL (ref 30.0–36.0)
MCV: 88.8 fL (ref 80.0–100.0)
MONO ABS: 0.6 10*3/uL (ref 0.1–1.0)
Monocytes Relative: 9 %
NEUTROS ABS: 4.2 10*3/uL (ref 1.7–7.7)
Neutrophils Relative %: 64 %
Platelets: 236 10*3/uL (ref 150–400)
RBC: 4.55 MIL/uL (ref 3.87–5.11)
RDW: 14 % (ref 11.5–15.5)
WBC: 6.5 10*3/uL (ref 4.0–10.5)
nRBC: 0 % (ref 0.0–0.2)

## 2018-02-04 LAB — BASIC METABOLIC PANEL
Anion gap: 10 (ref 5–15)
BUN: 20 mg/dL (ref 8–23)
CO2: 24 mmol/L (ref 22–32)
Calcium: 9.5 mg/dL (ref 8.9–10.3)
Chloride: 108 mmol/L (ref 98–111)
Creatinine, Ser: 1.31 mg/dL — ABNORMAL HIGH (ref 0.44–1.00)
GFR calc Af Amer: 46 mL/min — ABNORMAL LOW (ref >60)
GFR calc non Af Amer: 39 mL/min — ABNORMAL LOW (ref >60)
GLUCOSE: 112 mg/dL — AB (ref 70–99)
POTASSIUM: 3.5 mmol/L (ref 3.5–5.1)
Sodium: 142 mmol/L (ref 135–145)

## 2018-02-04 MED ORDER — TIMOLOL MALEATE 0.5 % OP SOLN
1.0000 [drp] | Freq: Every morning | OPHTHALMIC | Status: DC
  Filled 2018-02-04: qty 5

## 2018-02-04 MED ORDER — ASPIRIN EC 325 MG PO TBEC
325.0000 mg | DELAYED_RELEASE_TABLET | Freq: Every day | ORAL | Status: DC
  Administered 2018-02-05: 325 mg via ORAL
  Filled 2018-02-04: qty 1

## 2018-02-04 MED ORDER — AMLODIPINE BESYLATE 5 MG PO TABS
5.0000 mg | ORAL_TABLET | Freq: Every day | ORAL | Status: DC
  Administered 2018-02-05: 5 mg via ORAL
  Filled 2018-02-04: qty 1

## 2018-02-04 MED ORDER — LATANOPROST 0.005 % OP SOLN
1.0000 [drp] | Freq: Every day | OPHTHALMIC | Status: DC
  Filled 2018-02-04: qty 2.5

## 2018-02-04 MED ORDER — ILOPERIDONE 4 MG PO TABS
4.0000 mg | ORAL_TABLET | Freq: Two times a day (BID) | ORAL | Status: DC
  Filled 2018-02-04 (×3): qty 1

## 2018-02-04 MED ORDER — PERPHENAZINE 2 MG PO TABS
2.0000 mg | ORAL_TABLET | Freq: Two times a day (BID) | ORAL | Status: DC
  Filled 2018-02-04 (×4): qty 1

## 2018-02-04 MED ORDER — METHIMAZOLE 5 MG PO TABS
5.0000 mg | ORAL_TABLET | Freq: Every day | ORAL | Status: DC
  Filled 2018-02-04 (×2): qty 1

## 2018-02-04 MED ORDER — FUROSEMIDE 40 MG PO TABS
20.0000 mg | ORAL_TABLET | Freq: Every day | ORAL | Status: DC
  Administered 2018-02-05: 20 mg via ORAL
  Filled 2018-02-04: qty 1

## 2018-02-04 MED ORDER — OLANZAPINE 5 MG PO TABS
5.0000 mg | ORAL_TABLET | Freq: Every day | ORAL | Status: DC
  Administered 2018-02-05: 5 mg via ORAL
  Filled 2018-02-04: qty 1

## 2018-02-04 MED ORDER — CLONIDINE HCL 0.1 MG PO TABS: 0.1000 mg | ORAL_TABLET | Freq: Every evening | ORAL | Status: DC

## 2018-02-04 MED ORDER — CARVEDILOL 12.5 MG PO TABS
6.2500 mg | ORAL_TABLET | Freq: Two times a day (BID) | ORAL | Status: DC
  Administered 2018-02-05: 6.25 mg via ORAL
  Filled 2018-02-04: qty 1

## 2018-02-04 MED ORDER — TEMAZEPAM 15 MG PO CAPS: 30.0000 mg | ORAL_CAPSULE | Freq: Every evening | ORAL | Status: DC | PRN

## 2018-02-04 MED ORDER — TOPIRAMATE 25 MG PO TABS
50.0000 mg | ORAL_TABLET | Freq: Three times a day (TID) | ORAL | Status: DC
  Administered 2018-02-05 (×2): 50 mg via ORAL
  Filled 2018-02-04 (×2): qty 2

## 2018-02-04 MED ORDER — CLOPIDOGREL BISULFATE 75 MG PO TABS
75.0000 mg | ORAL_TABLET | Freq: Every day | ORAL | Status: DC
  Administered 2018-02-05: 75 mg via ORAL
  Filled 2018-02-04: qty 1

## 2018-02-04 MED ORDER — PRAVASTATIN SODIUM 20 MG PO TABS
20.0000 mg | ORAL_TABLET | Freq: Every evening | ORAL | Status: DC
  Filled 2018-02-04: qty 1
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
## 2018-02-04 NOTE — Progress Notes (Signed)
Contacted Dr. Thurnell Garbe. Will attempt again.

## 2018-02-04 NOTE — ED Notes (Signed)
Pt given sprite 

## 2018-02-04 NOTE — ED Triage Notes (Signed)
Pt from [facility]. Pt here on the insistence of her daughter who lives in [place]. Pt's caregiver reports that she is depressed due to no family coming to pick her up for the holidays. Pt is not eating. Caregiver reports pt will do this when she is depressed. Denies SI.

## 2018-02-04 NOTE — Progress Notes (Signed)
Nurse, Nira Conn, informed of pt disposition.

## 2018-02-05 ENCOUNTER — Encounter (HOSPITAL_COMMUNITY): Payer: Self-pay | Admitting: Registered Nurse

## 2018-02-05 DIAGNOSIS — F3131 Bipolar disorder, current episode depressed, mild: Secondary | ICD-10-CM | POA: Diagnosis not present

## 2018-02-05 NOTE — ED Notes (Signed)
Heather, Education officer, museum, stated patient is cleared and ready for discharge upon provider approval.

## 2018-02-05 NOTE — Discharge Instructions (Addendum)
Follow-up with community mental health resources °

## 2018-02-05 NOTE — ED Notes (Signed)
Pt cleared from TTS.  Social worker in room at this time

## 2018-02-05 NOTE — Progress Notes (Signed)
Contacted [facility], voicemail picked up. Left a VM for them to return call so that report can be given.
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
## 2018-02-05 NOTE — ED Provider Notes (Signed)
Follow-up with community mental health resources.   Nat Christen, MD 02/05/18 [phone]

## 2018-02-05 NOTE — ED Notes (Signed)
Patient caregiver stated she was concerned about patient's medications being received at [facility]. Stated the patient seems overly sedated. Encouraged caregiver to express concerns.

## 2018-02-05 NOTE — ED Notes (Signed)
Patient's caregiver tearful. Stated she is a diabetic and has not eaten, and that she is super stressed out right now. She stated she felt like she bothered the nurses by asking too many questions. Provided emotional support to caregiver. Caregiver states she now feels very relieved to have someone to talk to.

## 2018-02-06 ENCOUNTER — Other Ambulatory Visit: Payer: Self-pay | Admitting: Family Medicine

## 2018-02-06 LAB — URINE CULTURE: Culture: NO GROWTH

## 2018-02-20 ENCOUNTER — Ambulatory Visit: Payer: Medicare Other | Admitting: "Endocrinology

## 2018-02-20 DIAGNOSIS — E0590 Thyrotoxicosis, unspecified without thyrotoxic crisis or storm: Secondary | ICD-10-CM | POA: Diagnosis not present
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
## 2018-02-22 ENCOUNTER — Ambulatory Visit (HOSPITAL_COMMUNITY): Payer: Self-pay

## 2018-02-22 ENCOUNTER — Other Ambulatory Visit (HOSPITAL_COMMUNITY): Payer: Self-pay | Admitting: Family Medicine

## 2018-02-22 DIAGNOSIS — Z1231 Encounter for screening mammogram for malignant neoplasm of breast: Secondary | ICD-10-CM

## 2018-02-22 DIAGNOSIS — Z1239 Encounter for other screening for malignant neoplasm of breast: Secondary | ICD-10-CM

## 2018-02-28 ENCOUNTER — Encounter (HOSPITAL_COMMUNITY): Payer: Self-pay | Admitting: Psychiatry

## 2018-02-28 ENCOUNTER — Ambulatory Visit (INDEPENDENT_AMBULATORY_CARE_PROVIDER_SITE_OTHER): Payer: Medicare Other | Admitting: Psychiatry

## 2018-02-28 VITALS — BP 113/64 | HR 60 | Ht 60.0 in | Wt 124.8 lb

## 2018-02-28 DIAGNOSIS — F319 Bipolar disorder, unspecified: Secondary | ICD-10-CM | POA: Diagnosis not present

## 2018-02-28 NOTE — Patient Instructions (Signed)
1. Continue fanapt 4 mg BID 2. Decrease perphenazine 2 mg daily  3. Continue olanzapine 5 mg qhs  4.Return to clinic in three months for 15 mins

## 2018-03-15 ENCOUNTER — Ambulatory Visit (HOSPITAL_COMMUNITY): Payer: Self-pay

## 2018-03-18 ENCOUNTER — Ambulatory Visit (HOSPITAL_COMMUNITY): Payer: Self-pay

## 2018-03-21 ENCOUNTER — Ambulatory Visit: Payer: Medicare Other | Admitting: "Endocrinology

## 2018-04-01 ENCOUNTER — Ambulatory Visit: Payer: Medicare Other | Admitting: Family Medicine

## 2018-04-03 ENCOUNTER — Encounter: Payer: Self-pay | Admitting: Family Medicine

## 2018-04-03 ENCOUNTER — Ambulatory Visit (INDEPENDENT_AMBULATORY_CARE_PROVIDER_SITE_OTHER): Payer: Medicare Other | Admitting: Family Medicine

## 2018-04-03 ENCOUNTER — Other Ambulatory Visit: Payer: Self-pay

## 2018-04-03 VITALS — BP 126/70 | HR 52 | Temp 98.1°F | Resp 16 | Ht 60.0 in | Wt 125.4 lb

## 2018-04-03 DIAGNOSIS — E119 Type 2 diabetes mellitus without complications: Secondary | ICD-10-CM | POA: Diagnosis not present

## 2018-04-03 DIAGNOSIS — E0590 Thyrotoxicosis, unspecified without thyrotoxic crisis or storm: Secondary | ICD-10-CM

## 2018-04-03 DIAGNOSIS — E440 Moderate protein-calorie malnutrition: Secondary | ICD-10-CM

## 2018-04-03 DIAGNOSIS — I10 Essential (primary) hypertension: Secondary | ICD-10-CM

## 2018-04-03 DIAGNOSIS — E7800 Pure hypercholesterolemia, unspecified: Secondary | ICD-10-CM

## 2018-04-03 NOTE — Assessment & Plan Note (Signed)
Controlled no changes 

## 2018-04-03 NOTE — Assessment & Plan Note (Signed)
We called and scheduled endocrine follow up Obtain labs today

## 2018-04-03 NOTE — Patient Instructions (Addendum)
Endocrinology appointment to be scheduled We will call with lab results  F/U 4 months for physical

## 2018-04-03 NOTE — Progress Notes (Signed)
   Subjective:    Patient ID: [name], female    DOB: Oct 09, 1945, 73 y.o.   MRN: [id number]  Patient presents for Follow-up (is not fasting) Pt here to f/u chronic medical problems  No concerns noted from facility  weight down 2lbs in past 2 months, states she is eating a little better, drinking boost daily   Perphenazine decreased by psychiatry recently  Hyperthyroidism- she missed her appt with endocrine due for repeat TFT      Review Of Systems:  GEN- denies fatigue, fever, weight loss,weakness, recent illness HEENT- denies eye drainage, change in vision, nasal discharge, CVS- denies chest pain, palpitations RESP- denies SOB, cough, wheeze ABD- denies N/V, change in stools, abd pain GU- denies dysuria, hematuria, dribbling, incontinence MSK- denies joint pain, muscle aches, injury Neuro- denies headache, dizziness, syncope, seizure activity       Objective:    BP 126/70   Pulse (!) 52   Temp 98.1 F (36.7 C) (Oral)   Resp 16   Ht 5' (1.524 m)   Wt 125 lb 6.4 oz (56.9 kg)   SpO2 98%   BMI 24.49 kg/m  GEN- NAD, alert and oriented x3,sits slumped over- normal posture for pt HEENT- Right eye reactive, EOMI, non injected sclera, pink conjunctiva, MMM, oropharynx clear Neck- Supple, no thyromegaly, no LAD CVS- RRR, no murmur RESP-CTAB ABD-NABS,soft,NT,ND EXT- No edema Pulses- Radial  2+        Assessment & Plan:      Problem List Items Addressed This Visit      Unprioritized   Diabetes mellitus ([place]) - Primary    Diet controlled recheck A1C and non fasting lipid      Relevant Orders   CBC with Differential/Platelet (Completed)   Comprehensive metabolic panel   Hemoglobin A1c   Lipid panel   HTN (hypertension)    Controlled no changes       Hyperlipidemia   Relevant Orders   Lipid panel   Protein-calorie malnutrition ([place])    Will not add any meds, encourage regular eating Continue protein supplements  Check thyroid      Subclinical  hyperthyroidism    We called and scheduled endocrine follow up Obtain labs today      Relevant Orders   T3, free (Completed)   TSH (Completed)   T4, free (Completed)      Note: This dictation was prepared with Dragon dictation along with smaller phrase technology. Any transcriptional errors that result from this process are unintentional.

## 2018-04-03 NOTE — Assessment & Plan Note (Signed)
Diet controlled recheck A1C and non fasting lipid

## 2018-04-03 NOTE — Assessment & Plan Note (Addendum)
Will not add any meds, encourage regular eating Continue protein supplements  Check thyroid

## 2018-04-04 LAB — COMPREHENSIVE METABOLIC PANEL
AG Ratio: 1.4 (calc) (ref 1.0–2.5)
ALT: 4 U/L — ABNORMAL LOW (ref 6–29)
AST: 11 U/L (ref 10–35)
Albumin: 3.6 g/dL (ref 3.6–5.1)
Alkaline phosphatase (APISO): 63 U/L (ref 33–130)
BUN/Creatinine Ratio: 15 (calc) (ref 6–22)
BUN: 19 mg/dL (ref 7–25)
CO2: 28 mmol/L (ref 20–32)
Calcium: 9.8 mg/dL (ref 8.6–10.4)
Chloride: 109 mmol/L (ref 98–110)
Creat: 1.31 mg/dL — ABNORMAL HIGH (ref 0.60–0.93)
Globulin: 2.6 g/dL (calc) (ref 1.9–3.7)
Glucose, Bld: 113 mg/dL — ABNORMAL HIGH (ref 65–99)
Potassium: 3.8 mmol/L (ref 3.5–5.3)
Sodium: 146 mmol/L (ref 135–146)
Total Bilirubin: 0.3 mg/dL (ref 0.2–1.2)
Total Protein: 6.2 g/dL (ref 6.1–8.1)

## 2018-04-04 LAB — LIPID PANEL
CHOL/HDL RATIO: 2.5 (calc) (ref <5.0)
CHOLESTEROL: 132 mg/dL (ref <200)
HDL: 53 mg/dL (ref >50)
LDL CHOLESTEROL (CALC): 65 mg/dL
Non-HDL Cholesterol (Calc): 79 mg/dL (calc) (ref <130)
TRIGLYCERIDES: 68 mg/dL (ref <150)

## 2018-04-04 LAB — CBC WITH DIFFERENTIAL/PLATELET
Absolute Monocytes: 570 cells/uL (ref 200–950)
Basophils Absolute: 38 cells/uL (ref 0–200)
Basophils Relative: 0.6 %
Eosinophils Absolute: 160 cells/uL (ref 15–500)
Eosinophils Relative: 2.5 %
HCT: 37.6 % (ref 35.0–45.0)
Hemoglobin: 12 g/dL (ref 11.7–15.5)
Lymphs Abs: 1933 cells/uL (ref 850–3900)
MCH: 27.3 pg (ref 27.0–33.0)
MCHC: 31.9 g/dL — ABNORMAL LOW (ref 32.0–36.0)
MCV: 85.6 fL (ref 80.0–100.0)
MPV: 12 fL (ref 7.5–12.5)
Monocytes Relative: 8.9 %
Neutro Abs: 3699 cells/uL (ref 1500–7800)
Neutrophils Relative %: 57.8 %
Platelets: 232 10*3/uL (ref 140–400)
RBC: 4.39 10*6/uL (ref 3.80–5.10)
RDW: 12.8 % (ref 11.0–15.0)
Total Lymphocyte: 30.2 %
WBC: 6.4 10*3/uL (ref 3.8–10.8)

## 2018-04-04 LAB — HEMOGLOBIN A1C
Hgb A1c MFr Bld: 5.4 % of total Hgb (ref <5.7)
Mean Plasma Glucose: 108 (calc)
eAG (mmol/L): 6 (calc)

## 2018-04-04 LAB — TSH: TSH: 2.08 mIU/L (ref 0.40–4.50)

## 2018-04-04 LAB — T3, FREE: T3, Free: 2.5 pg/mL (ref 2.3–4.2)

## 2018-04-04 LAB — T4, FREE: Free T4: 1.2 ng/dL (ref 0.8–1.8)

## 2018-04-08 ENCOUNTER — Encounter: Payer: Self-pay | Admitting: *Deleted

## 2018-04-13 ENCOUNTER — Other Ambulatory Visit: Payer: Self-pay | Admitting: Family Medicine

## 2018-04-25 ENCOUNTER — Ambulatory Visit: Payer: Medicare Other | Admitting: "Endocrinology

## 2018-05-07 ENCOUNTER — Encounter: Payer: Self-pay | Admitting: "Endocrinology

## 2018-05-07 ENCOUNTER — Ambulatory Visit (INDEPENDENT_AMBULATORY_CARE_PROVIDER_SITE_OTHER): Payer: Medicare Other | Admitting: "Endocrinology

## 2018-05-07 VITALS — BP 117/75 | HR 60 | Ht 60.0 in

## 2018-05-07 DIAGNOSIS — E0590 Thyrotoxicosis, unspecified without thyrotoxic crisis or storm: Secondary | ICD-10-CM | POA: Diagnosis not present

## 2018-05-07 NOTE — Progress Notes (Signed)
Endocrinology follow-up note   Subjective:    Patient ID: [name], female    DOB: Feb 19, 1946,    Past Medical History:  Diagnosis Date  . Adenomatous colon polyp   . Bipolar 1 disorder ([place])   . Diabetes mellitus   . Diverticula of colon Colonoscopy 2013  . Glaucoma   . Hypertension   . OSA (obstructive sleep apnea)   . Schizophrenia ([place])   . Stroke [facility])    2 TIA  . Thyroid disease 2010  . Urinary incontinence, mixed    Past Surgical History:  Procedure Laterality Date  . ABDOMINAL HYSTERECTOMY    . COLONOSCOPY  2007   pancolonic diverticulosis, tcs in 2012 due to h/o adenomatous polyps  . COLONOSCOPY  11/15/2011   Procedure: COLONOSCOPY;  Surgeon: Daneil Dolin, MD;  Location: [facility] ENDO SU[facility];  Service: Endoscopy;  Laterality: N/A;  7:30  . 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 MULTIPLE TOOTH EXTRACTIONS     Social History   Socioeconomic History  . Marital status: Divorced    Spouse name: Not on file  . Number of children: Not on file  . Years of education: Not on file  . Highest education level: Not on file  Occupational History  . Not on file  Social Needs  . Financial resource strain: Not on file  . Food insecurity:    Worry: Not on file    Inability: Not on file  . Transportation needs:    Medical: Not on file    Non-medical: Not on file  Tobacco Use  . Smoking status: Never Smoker  . Smokeless tobacco: Never Used  Substance and Sexual Activity  . Alcohol use: No  . Drug use: No  . Sexual activity: Not on file  Lifestyle  . Physical activity:    Days per week: Not on file    Minutes per session: Not on file  . Stress: Not on file  Relationships  . Social connections:    Talks on phone: Not on file    Gets together: Not on file    Attends religious service: Not on file    Active member of club or organization: Not on file    Attends meetings of clubs or organizations: Not on file    Relationship status: Not on file  Other Topics  Concern  . Not on file  Social History Narrative   Lives in [facility]   She used to work as a Education officer, museum.    Divorced, married twice, has two children   Outpatient Encounter Medications as of 05/07/2018  Medication Sig  . amLODipine (NORVASC) 5 MG tablet TAKE (1) TABLET BY MOUTH ONCE DAILY.  Marland Kitchen aspirin EC 325 MG tablet TAKE (1) TABLET BY MOUTH ONCE DAILY. (Patient taking differently: Take 325 mg by mouth daily. )  . bisacodyl (DULCOLAX) 10 MG suppository Place 1 suppository (10 mg total) rectally every three (3) days as needed for moderate constipation (if patient has not had BM x3 days).  . Calcium-Vitamin D 600-200 MG-UNIT tablet TAKE 1 TABLET BY MOUTH TWICE DAILY. (Patient taking differently: Take 1 tablet by mouth 2 (two) times daily. )  . carvedilol (COREG) 6.25 MG tablet TAKE (1) TABLET BY MOUTH TWICE A DAY WITH MEALS. (Patient taking differently: Take 6.25 mg by mouth 2 (two) times daily with a meal. )  . cloNIDine (CATAPRES) 0.1 MG tablet TAKE 1 TABLET BY MOUTH IN THE  EVENING.  Marland Kitchen clopidogrel (PLAVIX) 75 MG tablet TAKE (1) TABLET BY MOUTH ONCE DAILY. (Patient taking differently: Take 75 mg by mouth daily. )  . FANAPT 4 MG TABS tablet TAKE 1 TABLET BY MOUTH TWICE DAILY.  . furosemide (LASIX) 20 MG tablet TAKE (1) TABLET BY MOUTH ONCE DAILY. (Patient taking differently: Take 20 mg by mouth daily. )  . Glucose Blood (BLOOD GLUCOSE TEST STRIPS) STRP Use to monitor FSBS 1x daily. Dx: E11.9.  Marland Kitchen hydrocortisone (PROCTOCORT) 1 % CREA Apply to rectal area BID PRN for pain. Please dispense rectal tip. (Patient taking differently: Apply 1 application topically 2 (two) times daily as needed (to rectum). )  . latanoprost (XALATAN) 0.005 % ophthalmic solution INSTILL 1 DROP INTO RIGHT EYE AT BEDTIME. (Patient taking differently: Place 1 drop into the right eye at bedtime. )  . loratadine (CLARITIN) 10 MG tablet Take 1 tablet (10 mg total) by mouth daily as needed for allergies.  .  methimazole (TAPAZOLE) 5 MG tablet Take 5 mg by mouth daily.  Marland Kitchen OLANZapine (ZYPREXA) 5 MG tablet TAKE 1 TABLET BY MOUTH AT BEDTIME.  Marland Kitchen perphenazine (TRILAFON) 2 MG tablet TAKE (1) TABLET BY MOUTH ONCE DAILY.  Marland Kitchen polyethylene glycol powder (GLYCOLAX/MIRALAX) powder Take 17 g by mouth 2 (two) times daily as needed. (Patient taking differently: Take 17 g by mouth 2 (two) times daily as needed for mild constipation. )  . pravastatin (PRAVACHOL) 20 MG tablet TAKE 1 TABLET BY MOUTH IN THE EVENING. (Patient taking differently: Take 20 mg by mouth every evening. )  . temazepam (RESTORIL) 30 MG capsule Take 1 capsule (30 mg total) by mouth at bedtime as needed for sleep.  Marland Kitchen timolol (TIMOPTIC) 0.5 % ophthalmic solution INSTILL 1 DROP INTO RIGHT EYE EVERY MORNING. (Patient taking differently: Place 1 drop into the right eye every morning. )  . topiramate (TOPAMAX) 50 MG tablet TAKE 1 TABLET BY MOUTH 3 TIMES DAILY. (Patient taking differently: Take 50 mg by mouth 3 (three) times daily. )  . traMADol (ULTRAM) 50 MG tablet Take 1 tablet (50 mg total) by mouth every 12 (twelve) hours as needed. (Patient taking differently: Take 50 mg by mouth every 12 (twelve) hours as needed for moderate pain. )   No facility-administered encounter medications on file as of 05/07/2018.    ALLERGIES: Allergies  Allergen Reactions  . Codeine     Listed on MAR but there is no reaction listed   VACCINATION STATUS: Immunization History  Administered Date(s) Administered  . Influenza Whole 11/24/2010  . Influenza, High Dose Seasonal PF 12/29/2016, 12/21/2017  . Influenza,inj,Quad PF,6+ Mos 01/29/2013, 01/02/2014, 04/18/2016, 11/16/2017  . Influenza-Unspecified 12/19/2011, 12/25/2013  . Pneumococcal Conjugate-13 01/29/2013    HPI  73 yr old female with multiple medical problems as above. She is here to follow-up  with repeat thyroid function test.  She was taken off of her low-dose methimazole during her last visit. Returns  with no acute complaints today.  He has a steady body weight, denies palpitations, tremors, nor heat intolerance.    she is still a nursing home resident due to failure to thrive.  Patient is a poor historian, accompanied by her driver.   her thyroid u/s is unremarkable. Her prior thyroid uptake was 13 % on 01/14/2014 with a dominant warm nodule on left lobe.   Review of Systems   Constitutional:  + Weight loss,  no fatigue, no subjective hyperthermia/hypothermia Eyes: no blurry vision, no xerophthalmia ENT: no sore throat, no  nodules palpated in throat, no dysphagia/odynophagia, no hoarseness Skin: no rashes Neurological: no tremors/numbness/tingling/dizziness Psychiatric: no depression/anxiety   Objective:    BP 117/75   Pulse 60   Ht 5' (1.524 m)   BMI 24.49 kg/m   Wt Readings from Last 3 Encounters:  04/03/18 125 lb 6.4 oz (56.9 kg)  02/04/18 127 lb 13.9 oz (58 kg)  01/30/18 128 lb 12.8 oz (58.4 kg)    Physical Exam  Constitutional: Stable state of mind, in NAD Eyes: PERRLA, EOMI, no exophthalmos ENT: moist mucous membranes, no thyromegaly, no cervical lymphadenopathy  Musculoskeletal: uses her walker due to disequilibrium, dependent edema on bilateral lower extremities. Skin: moist, warm, no rashes Neurological: No tremors of outstretched hands.    Results for orders placed or performed in visit on 04/03/18  T3, free  Result Value Ref Range   T3, Free 2.5 2.3 - 4.2 pg/mL  TSH  Result Value Ref Range   TSH 2.08 0.40 - 4.50 mIU/L  T4, free  Result Value Ref Range   Free T4 1.2 0.8 - 1.8 ng/dL  CBC with Differential/Platelet  Result Value Ref Range   WBC 6.4 3.8 - 10.8 Thousand/uL   RBC 4.39 3.80 - 5.10 Million/uL   Hemoglobin 12.0 11.7 - 15.5 g/dL   HCT 37.6 35.0 - 45.0 %   MCV 85.6 80.0 - 100.0 fL   MCH 27.3 27.0 - 33.0 pg   MCHC 31.9 (L) 32.0 - 36.0 g/dL   RDW 12.8 11.0 - 15.0 %   Platelets 232 140 - 400 Thousand/uL   MPV 12.0 7.5 - 12.5 fL   Neutro  Abs 3,699 1,500 - 7,800 cells/uL   Lymphs Abs 1,933 850 - 3,900 cells/uL   Absolute Monocytes 570 200 - 950 cells/uL   Eosinophils Absolute 160 15 - 500 cells/uL   Basophils Absolute 38 0 - 200 cells/uL   Neutrophils Relative % 57.8 %   Total Lymphocyte 30.2 %   Monocytes Relative 8.9 %   Eosinophils Relative 2.5 %   Basophils Relative 0.6 %  Comprehensive metabolic panel  Result Value Ref Range   Glucose, Bld 113 (H) 65 - 99 mg/dL   BUN 19 7 - 25 mg/dL   Creat 1.31 (H) 0.60 - 0.93 mg/dL   BUN/Creatinine Ratio 15 6 - 22 (calc)   Sodium 146 135 - 146 mmol/L   Potassium 3.8 3.5 - 5.3 mmol/L   Chloride 109 98 - 110 mmol/L   CO2 28 20 - 32 mmol/L   Calcium 9.8 8.6 - 10.4 mg/dL   Total Protein 6.2 6.1 - 8.1 g/dL   Albumin 3.6 3.6 - 5.1 g/dL   Globulin 2.6 1.9 - 3.7 g/dL (calc)   AG Ratio 1.4 1.0 - 2.5 (calc)   Total Bilirubin 0.3 0.2 - 1.2 mg/dL   Alkaline phosphatase (APISO) 63 33 - 130 U/L   AST 11 10 - 35 U/L   ALT 4 (L) 6 - 29 U/L  Hemoglobin A1c  Result Value Ref Range   Hgb A1c MFr Bld 5.4 <5.7 % of total Hgb   Mean Plasma Glucose 108 (calc)   eAG (mmol/L) 6.0 (calc)  Lipid panel  Result Value Ref Range   Cholesterol 132 <200 mg/dL   HDL 53 >50 mg/dL   Triglycerides 68 <150 mg/dL   LDL Cholesterol (Calc) 65 mg/dL (calc)   Total CHOL/HDL Ratio 2.5 <5.0 (calc)   Non-HDL Cholesterol (Calc) 79 <130 mg/dL (calc)   Complete Blood Count (Most recent):  Lab Results  Component Value Date   WBC 6.4 04/03/2018   HGB 12.0 04/03/2018   HCT 37.6 04/03/2018   MCV 85.6 04/03/2018   PLT 232 04/03/2018   Chemistry (most recent): Lab Results  Component Value Date   NA 146 04/03/2018   K 3.8 04/03/2018   CL 109 04/03/2018   CO2 28 04/03/2018   BUN 19 04/03/2018   CREATININE 1.31 (H) 04/03/2018   Diabetic Labs (most recent): Lab Results  Component Value Date   HGBA1C 5.4 04/03/2018   HGBA1C 5.6 06/01/2017   HGBA1C 5.4 04/20/2016      Assessment & Plan:    1.Subclinical hyperthyroidism: 2. Toxic nodule.  -Her previsit thyroid function tests are consistent with euthyroid presentation.  She would not require antithyroid treatment at this time.  She will have repeat thyroid function test in 6 months.    her prior u/s showed small nodules. -I encouraged one-on-one assistance during mealtimes.  I advised patient to maintain close follow up with her PCP for primary care needs. Follow up plan: Return in about 6 months (around 11/05/2018) for Follow up with Pre-visit Labs.  Glade Lloyd, MD Phone: [phone]  Fax: [phone]  -  This note was partially dictated with voice recognition software. Similar sounding words can be transcribed inadequately or may not  be corrected upon review.  05/07/2018, 12:05 PM

## 2018-05-14 ENCOUNTER — Other Ambulatory Visit: Payer: Self-pay | Admitting: Family Medicine

## 2018-05-20 ENCOUNTER — Other Ambulatory Visit: Payer: Self-pay | Admitting: Family Medicine

## 2018-05-27 NOTE — Progress Notes (Deleted)
[place] MD/PA/NP OP Progress Note  05/27/2018 12:42 PM [name]  MRN:  [id number]  Chief Complaint:  HPI: *** Visit Diagnosis: No diagnosis found.  Past Psychiatric History: Please see initial evaluation for full details. I have reviewed the history. No updates at this time.     Past Medical History:  Past Medical History:  Diagnosis Date  . Adenomatous colon polyp   . Bipolar 1 disorder ([place])   . Diabetes mellitus   . Diverticula of colon Colonoscopy 2013  . Glaucoma   . Hypertension   . OSA (obstructive sleep apnea)   . Schizophrenia ([place])   . Stroke [facility])    2 TIA  . Thyroid disease 2010  . Urinary incontinence, mixed     Past Surgical History:  Procedure Laterality Date  . ABDOMINAL HYSTERECTOMY    . COLONOSCOPY  2007   pancolonic diverticulosis, tcs in 2012 due to h/o adenomatous polyps  . COLONOSCOPY  11/15/2011   Procedure: COLONOSCOPY;  Surgeon: Daneil Dolin, MD;  Location: [facility] ENDO SUITE;  Service: Endoscopy;  Laterality: N/A;  7:30  . 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 MULTIPLE TOOTH EXTRACTIONS      Family Psychiatric History: Please see initial evaluation for full details. I have reviewed the history. No updates at this time.     Family History:  Family History  Problem Relation Age of Onset  . Other Other        unknown  . Thyroid disease Daughter        Partial thyroid removal/tumor     Social History:  Social History   Socioeconomic History  . Marital status: Divorced    Spouse name: Not on file  . Number of children: Not on file  . Years of education: Not on file  . Highest education level: Not on file  Occupational History  . Not on file  Social Needs  . Financial resource strain: Not on file  . Food insecurity:    Worry: Not on file    Inability: Not on file  . Transportation needs:    Medical: Not on file    Non-medical: Not on file  Tobacco Use  . Smoking status: Never Smoker  . Smokeless tobacco: Never Used  Substance  and Sexual Activity  . Alcohol use: No  . Drug use: No  . Sexual activity: Not on file  Lifestyle  . Physical activity:    Days per week: Not on file    Minutes per session: Not on file  . Stress: Not on file  Relationships  . Social connections:    Talks on phone: Not on file    Gets together: Not on file    Attends religious service: Not on file    Active member of club or organization: Not on file    Attends meetings of clubs or organizations: Not on file    Relationship status: Not on file  Other Topics Concern  . Not on file  Social History Narrative   Lives in [facility]   She used to work as a Education officer, museum.    Divorced, married twice, has two children    Allergies:  Allergies  Allergen Reactions  . Codeine     Listed on MAR but there is no reaction listed    Metabolic Disorder Labs: Lab Results  Component Value Date   HGBA1C 5.4 04/03/2018   MPG 108 04/03/2018   MPG 114  06/01/2017   No results found for: PROLACTIN Lab Results  Component Value Date   CHOL 132 04/03/2018   TRIG 68 04/03/2018   HDL 53 04/03/2018   CHOLHDL 2.5 04/03/2018   VLDL 13 04/20/2016   LDLCALC 65 04/03/2018   LDLCALC 56 06/01/2017   Lab Results  Component Value Date   TSH 2.08 04/03/2018   TSH 1.84 10/25/2017    Therapeutic Level Labs: No results found for: LITHIUM Lab Results  Component Value Date   VALPROATE 53.0 01/08/2009   No components found for:  CBMZ  Current Medications: Current Outpatient Medications  Medication Sig Dispense Refill  . amLODipine (NORVASC) 5 MG tablet TAKE (1) TABLET BY MOUTH ONCE DAILY. 90 tablet 1  . aspirin EC 325 MG tablet TAKE (1) TABLET BY MOUTH ONCE DAILY. (Patient taking differently: Take 325 mg by mouth daily. ) 30 tablet 11  . bisacodyl (DULCOLAX) 10 MG suppository Place 1 suppository (10 mg total) rectally every three (3) days as needed for moderate constipation (if patient has not had BM x3 days). 12 suppository 0  .  Calcium-Vitamin D 600-200 MG-UNIT tablet TAKE 1 TABLET BY MOUTH TWICE DAILY. (Patient taking differently: Take 1 tablet by mouth 2 (two) times daily. ) 60 tablet 11  . carvedilol (COREG) 6.25 MG tablet TAKE (1) TABLET BY MOUTH TWICE A DAY WITH MEALS. (Patient taking differently: Take 6.25 mg by mouth 2 (two) times daily with a meal. ) 60 tablet 11  . cloNIDine (CATAPRES) 0.1 MG tablet TAKE 1 TABLET BY MOUTH IN THE EVENING. 30 tablet 11  . clopidogrel (PLAVIX) 75 MG tablet TAKE (1) TABLET BY MOUTH ONCE DAILY. (Patient taking differently: Take 75 mg by mouth daily. ) 30 tablet 11  . FANAPT 4 MG TABS tablet TAKE 1 TABLET BY MOUTH TWICE DAILY. 60 tablet 11  . furosemide (LASIX) 20 MG tablet TAKE (1) TABLET BY MOUTH ONCE DAILY. (Patient taking differently: Take 20 mg by mouth daily. ) 30 tablet 11  . Glucose Blood (BLOOD GLUCOSE TEST STRIPS) STRP Use to monitor FSBS 1x daily. Dx: E11.9. 50 each 11  . hydrocortisone (PROCTOCORT) 1 % CREA Apply to rectal area BID PRN for pain. Please dispense rectal tip. (Patient taking differently: Apply 1 application topically 2 (two) times daily as needed (to rectum). ) 1 Tube 3  . latanoprost (XALATAN) 0.005 % ophthalmic solution INSTILL 1 DROP INTO RIGHT EYE AT BEDTIME. 2.5 mL 11  . loratadine (CLARITIN) 10 MG tablet Take 1 tablet (10 mg total) by mouth daily as needed for allergies. 30 tablet 6  . methimazole (TAPAZOLE) 5 MG tablet TAKE (1) TABLET BY MOUTH ONCE DAILY. 30 tablet 11  . OLANZapine (ZYPREXA) 5 MG tablet TAKE 1 TABLET BY MOUTH AT BEDTIME. 30 tablet 11  . perphenazine (TRILAFON) 2 MG tablet TAKE (1) TABLET BY MOUTH ONCE DAILY. 30 tablet 11  . polyethylene glycol powder (GLYCOLAX/MIRALAX) powder Take 17 g by mouth 2 (two) times daily as needed. (Patient taking differently: Take 17 g by mouth 2 (two) times daily as needed for mild constipation. ) 3350 g 1  . pravastatin (PRAVACHOL) 20 MG tablet TAKE 1 TABLET BY MOUTH IN THE EVENING. (Patient taking differently:  Take 20 mg by mouth every evening. ) 30 tablet 11  . temazepam (RESTORIL) 30 MG capsule Take 1 capsule (30 mg total) by mouth at bedtime as needed for sleep. 30 capsule 3  . timolol (TIMOPTIC) 0.5 % ophthalmic solution INSTILL 1 DROP INTO RIGHT EYE  EVERY MORNING 10 mL 11  . topiramate (TOPAMAX) 50 MG tablet TAKE 1 TABLET BY MOUTH 3 TIMES DAILY. (Patient taking differently: Take 50 mg by mouth 3 (three) times daily. ) 90 tablet 11  . traMADol (ULTRAM) 50 MG tablet Take 1 tablet (50 mg total) by mouth every 12 (twelve) hours as needed. (Patient taking differently: Take 50 mg by mouth every 12 (twelve) hours as needed for moderate pain. ) 30 tablet 0   No current facility-administered medications for this visit.      Musculoskeletal: Strength & Muscle Tone: within normal limits Gait & Station: normal Patient leans: N/A  Psychiatric Specialty Exam: ROS  There were no vitals taken for this visit.There is no height or weight on file to calculate BMI.  General Appearance: Fairly Groomed  Eye Contact:  Good  Speech:  Clear and Coherent  Volume:  Normal  Mood:  {BHH MOOD:22306}  Affect:  {Affect (PAA):22687}  Thought Process:  Coherent  Orientation:  Full (Time, Place, and Person)  Thought Content: Logical   Suicidal Thoughts:  {ST/HT (PAA):22692}  Homicidal Thoughts:  {ST/HT (PAA):22692}  Memory:  Immediate;   Good  Judgement:  {Judgement (PAA):22694}  Insight:  {Insight (PAA):22695}  Psychomotor Activity:  Normal  Concentration:  Concentration: Good and Attention Span: Good  Recall:  Good  Fund of Knowledge: Good  Language: Good  Akathisia:  No  Handed:  Right  AIMS (if indicated): not done  Assets:  Communication Skills Desire for Improvement  ADL's:  Intact  Cognition: WNL  Sleep:  {BHH GOOD/FAIR/POOR:22877}   Screenings: PHQ2-9     Office Visit from 01/30/2018 in [place] Office Visit from 11/16/2017 in [place] Office Visit from  06/01/2017 in [place] Office Visit from 11/28/2016 in [place] Office Visit from 01/21/2016 in [facility]  PHQ-2 Total Score  0  0  0  0  0  PHQ-9 Total Score  -  -  0  -  -       Assessment and Plan:  [name] is a 73 y.o. year old female with a history of bipolar I disorder, subclinical hyperthyroidism, hypertension, diabetes, TIA  , who presents for follow up appointment for No diagnosis found.  # Bipolar I disorder # r/o schizoaffective disorder  Although her speech is not spontaneous, she continues to demonstrate linear thought process when she is asked to elaborate.  The staff denies behavior issues since the patient visited ED in the context of change in her caregiver.  Her medication was unchanged since the last visit; will plan to taper down perphenazine to avoid polypharmacy given she has some parkinsonian symptoms.  Will continue Fanapt and olanzapine to target bipolar disorder.  Discussed risks including, but not limited to metabolic side effect and EPS.   Plan  1. Continue fanapt 4 mg BID 2. Decrease perphenazine 2 mg daily (used to take 4 mg BID 06/2017) 3.Continue olanzapine 5 mg qhs  4.Return to clinic inthree months for16mins (she is on topiramate 50 mg TID, clonidine 0.1 mg qpm, temazepam 30 mg qhs)  The patient demonstrates the following risk factors for suicide: Chronic risk factors for suicide include:psychiatric disorder ofbipolar Idisorder. Acute risk factorsfor suicide include: unemployment. Protective factorsfor this patient include: positive social support and hope for the future. Considering these factors, the overall suicide risk at this point appears to below. Patientisappropriate for outpatient follow up.  Norman [place], MD 05/27/2018, 12:42  PM

## 2018-05-30 ENCOUNTER — Ambulatory Visit (HOSPITAL_COMMUNITY): Payer: Medicare Other | Admitting: Psychiatry

## 2018-07-30 ENCOUNTER — Encounter: Payer: Medicare Other | Admitting: Family Medicine

## 2018-08-07 ENCOUNTER — Other Ambulatory Visit: Payer: Self-pay | Admitting: Family Medicine

## 2018-08-19 ENCOUNTER — Telehealth: Payer: Self-pay | Admitting: *Deleted

## 2018-08-19 DIAGNOSIS — I10 Essential (primary) hypertension: Secondary | ICD-10-CM | POA: Diagnosis not present

## 2018-08-19 DIAGNOSIS — I69354 Hemiplegia and hemiparesis following cerebral infarction affecting left non-dominant side: Secondary | ICD-10-CM | POA: Diagnosis not present

## 2018-08-19 DIAGNOSIS — R5381 Other malaise: Secondary | ICD-10-CM | POA: Diagnosis not present

## 2018-08-19 DIAGNOSIS — R638 Other symptoms and signs concerning food and fluid intake: Secondary | ICD-10-CM | POA: Diagnosis not present

## 2018-08-19 DIAGNOSIS — R404 Transient alteration of awareness: Secondary | ICD-10-CM | POA: Diagnosis not present

## 2018-08-19 DIAGNOSIS — H409 Unspecified glaucoma: Secondary | ICD-10-CM | POA: Diagnosis not present

## 2018-08-19 DIAGNOSIS — Z681 Body mass index (BMI) 19 or less, adult: Secondary | ICD-10-CM | POA: Diagnosis not present

## 2018-08-19 DIAGNOSIS — R001 Bradycardia, unspecified: Secondary | ICD-10-CM | POA: Diagnosis not present

## 2018-08-19 DIAGNOSIS — E876 Hypokalemia: Secondary | ICD-10-CM | POA: Diagnosis not present

## 2018-08-19 DIAGNOSIS — N390 Urinary tract infection, site not specified: Secondary | ICD-10-CM | POA: Diagnosis not present

## 2018-08-19 DIAGNOSIS — R531 Weakness: Secondary | ICD-10-CM | POA: Diagnosis not present

## 2018-08-19 DIAGNOSIS — F319 Bipolar disorder, unspecified: Secondary | ICD-10-CM | POA: Diagnosis not present

## 2018-08-19 DIAGNOSIS — R630 Anorexia: Secondary | ICD-10-CM | POA: Diagnosis not present

## 2018-08-19 NOTE — Telephone Encounter (Signed)
Call placed to facility and [name], tech made aware.

## 2018-08-19 NOTE — Telephone Encounter (Signed)
Received call from [facility] [phone]~ telephone.   Reports that patient has x2 days of loss of appetite. States that she is not eating or drinking. States that she puts her head down in her plate at the table and does not eat. States that she is not accepting any assistance to eat or drink. Refuses ensure as well.   Denies any other changes. No fever, SOB, urinary issues,.   MD please advise.

## 2018-08-19 NOTE — Telephone Encounter (Signed)
Please take her to ER to be evaluated if she refuses to eat and drink

## 2018-08-20 DIAGNOSIS — R001 Bradycardia, unspecified: Secondary | ICD-10-CM | POA: Diagnosis not present

## 2018-08-20 DIAGNOSIS — R627 Adult failure to thrive: Secondary | ICD-10-CM | POA: Diagnosis not present

## 2018-08-21 DIAGNOSIS — R279 Unspecified lack of coordination: Secondary | ICD-10-CM | POA: Diagnosis not present

## 2018-08-21 DIAGNOSIS — Z8673 Personal history of transient ischemic attack (TIA), and cerebral infarction without residual deficits: Secondary | ICD-10-CM | POA: Diagnosis not present

## 2018-08-21 DIAGNOSIS — I10 Essential (primary) hypertension: Secondary | ICD-10-CM | POA: Diagnosis present

## 2018-08-21 DIAGNOSIS — Z7401 Bed confinement status: Secondary | ICD-10-CM | POA: Diagnosis not present

## 2018-08-21 DIAGNOSIS — R001 Bradycardia, unspecified: Secondary | ICD-10-CM | POA: Diagnosis present

## 2018-08-21 DIAGNOSIS — Z970 Presence of artificial eye: Secondary | ICD-10-CM | POA: Diagnosis not present

## 2018-08-21 DIAGNOSIS — F319 Bipolar disorder, unspecified: Secondary | ICD-10-CM | POA: Diagnosis present

## 2018-08-21 DIAGNOSIS — E785 Hyperlipidemia, unspecified: Secondary | ICD-10-CM | POA: Diagnosis not present

## 2018-08-21 DIAGNOSIS — E876 Hypokalemia: Secondary | ICD-10-CM | POA: Diagnosis present

## 2018-08-21 DIAGNOSIS — I69354 Hemiplegia and hemiparesis following cerebral infarction affecting left non-dominant side: Secondary | ICD-10-CM | POA: Diagnosis not present

## 2018-08-21 DIAGNOSIS — I69391 Dysphagia following cerebral infarction: Secondary | ICD-10-CM | POA: Diagnosis not present

## 2018-08-21 DIAGNOSIS — E039 Hypothyroidism, unspecified: Secondary | ICD-10-CM | POA: Diagnosis not present

## 2018-08-21 DIAGNOSIS — R5381 Other malaise: Secondary | ICD-10-CM | POA: Diagnosis not present

## 2018-08-21 DIAGNOSIS — I69328 Other speech and language deficits following cerebral infarction: Secondary | ICD-10-CM | POA: Diagnosis not present

## 2018-08-21 DIAGNOSIS — H409 Unspecified glaucoma: Secondary | ICD-10-CM | POA: Diagnosis present

## 2018-08-21 DIAGNOSIS — Z7902 Long term (current) use of antithrombotics/antiplatelets: Secondary | ICD-10-CM | POA: Diagnosis not present

## 2018-08-21 DIAGNOSIS — R531 Weakness: Secondary | ICD-10-CM | POA: Diagnosis present

## 2018-08-21 DIAGNOSIS — Z20828 Contact with and (suspected) exposure to other viral communicable diseases: Secondary | ICD-10-CM | POA: Diagnosis present

## 2018-08-21 DIAGNOSIS — I6990 Unspecified sequelae of unspecified cerebrovascular disease: Secondary | ICD-10-CM | POA: Diagnosis not present

## 2018-08-21 DIAGNOSIS — M6281 Muscle weakness (generalized): Secondary | ICD-10-CM | POA: Diagnosis not present

## 2018-08-21 DIAGNOSIS — R630 Anorexia: Secondary | ICD-10-CM | POA: Diagnosis not present

## 2018-08-21 DIAGNOSIS — N390 Urinary tract infection, site not specified: Secondary | ICD-10-CM | POA: Diagnosis not present

## 2018-08-21 DIAGNOSIS — Z7982 Long term (current) use of aspirin: Secondary | ICD-10-CM | POA: Diagnosis not present

## 2018-08-21 DIAGNOSIS — R404 Transient alteration of awareness: Secondary | ICD-10-CM | POA: Diagnosis not present

## 2018-08-21 DIAGNOSIS — Z681 Body mass index (BMI) 19 or less, adult: Secondary | ICD-10-CM | POA: Diagnosis not present

## 2018-08-23 DIAGNOSIS — I69328 Other speech and language deficits following cerebral infarction: Secondary | ICD-10-CM | POA: Diagnosis not present

## 2018-08-23 DIAGNOSIS — Z8673 Personal history of transient ischemic attack (TIA), and cerebral infarction without residual deficits: Secondary | ICD-10-CM | POA: Diagnosis not present

## 2018-08-23 DIAGNOSIS — Z7401 Bed confinement status: Secondary | ICD-10-CM | POA: Diagnosis not present

## 2018-08-23 DIAGNOSIS — N390 Urinary tract infection, site not specified: Secondary | ICD-10-CM | POA: Diagnosis not present

## 2018-08-23 DIAGNOSIS — R404 Transient alteration of awareness: Secondary | ICD-10-CM | POA: Diagnosis not present

## 2018-08-23 DIAGNOSIS — F319 Bipolar disorder, unspecified: Secondary | ICD-10-CM | POA: Diagnosis not present

## 2018-08-23 DIAGNOSIS — H409 Unspecified glaucoma: Secondary | ICD-10-CM | POA: Diagnosis not present

## 2018-08-23 DIAGNOSIS — E119 Type 2 diabetes mellitus without complications: Secondary | ICD-10-CM | POA: Diagnosis not present

## 2018-08-23 DIAGNOSIS — E785 Hyperlipidemia, unspecified: Secondary | ICD-10-CM | POA: Diagnosis not present

## 2018-08-23 DIAGNOSIS — I69391 Dysphagia following cerebral infarction: Secondary | ICD-10-CM | POA: Diagnosis not present

## 2018-08-23 DIAGNOSIS — R509 Fever, unspecified: Secondary | ICD-10-CM | POA: Diagnosis not present

## 2018-08-23 DIAGNOSIS — E039 Hypothyroidism, unspecified: Secondary | ICD-10-CM | POA: Diagnosis not present

## 2018-08-23 DIAGNOSIS — R630 Anorexia: Secondary | ICD-10-CM | POA: Diagnosis not present

## 2018-08-23 DIAGNOSIS — M6281 Muscle weakness (generalized): Secondary | ICD-10-CM | POA: Diagnosis not present

## 2018-08-23 DIAGNOSIS — R279 Unspecified lack of coordination: Secondary | ICD-10-CM | POA: Diagnosis not present

## 2018-08-23 DIAGNOSIS — G2401 Drug induced subacute dyskinesia: Secondary | ICD-10-CM | POA: Diagnosis not present

## 2018-08-23 DIAGNOSIS — I69354 Hemiplegia and hemiparesis following cerebral infarction affecting left non-dominant side: Secondary | ICD-10-CM | POA: Diagnosis not present

## 2018-08-23 DIAGNOSIS — I6990 Unspecified sequelae of unspecified cerebrovascular disease: Secondary | ICD-10-CM | POA: Diagnosis not present

## 2018-08-23 DIAGNOSIS — I10 Essential (primary) hypertension: Secondary | ICD-10-CM | POA: Diagnosis not present

## 2018-08-26 DIAGNOSIS — N390 Urinary tract infection, site not specified: Secondary | ICD-10-CM | POA: Diagnosis not present

## 2018-08-26 DIAGNOSIS — I10 Essential (primary) hypertension: Secondary | ICD-10-CM | POA: Diagnosis not present

## 2018-08-26 DIAGNOSIS — R630 Anorexia: Secondary | ICD-10-CM | POA: Diagnosis not present

## 2018-08-26 DIAGNOSIS — M6281 Muscle weakness (generalized): Secondary | ICD-10-CM | POA: Diagnosis not present

## 2018-08-27 ENCOUNTER — Telehealth: Payer: Self-pay | Admitting: *Deleted

## 2018-08-27 DIAGNOSIS — N390 Urinary tract infection, site not specified: Secondary | ICD-10-CM | POA: Diagnosis not present

## 2018-08-27 DIAGNOSIS — I10 Essential (primary) hypertension: Secondary | ICD-10-CM | POA: Diagnosis not present

## 2018-08-27 DIAGNOSIS — G2401 Drug induced subacute dyskinesia: Secondary | ICD-10-CM | POA: Diagnosis not present

## 2018-08-27 DIAGNOSIS — F319 Bipolar disorder, unspecified: Secondary | ICD-10-CM | POA: Diagnosis not present

## 2018-08-27 NOTE — Telephone Encounter (Signed)
noted 

## 2018-08-27 NOTE — Telephone Encounter (Signed)
Received call from Zollie Beckers, MD with [facility]. [phone]~ telephone.   Reports that patient was admitted to the [facility] in [place] after recent hospitalization at [facility] for rehab and PT.   MD had concerns as follows: 1. Ambulatory status- states that patient is currently lying in bed and has not gotten out since arriving at facility. States that she is hesitant to interact with staff.   2. Polypharmacy for Bipolar- states that patient has Parkinson's like tremor and he is concerned it may be tardive dyskinesia. Reports that new medications can cause Sx. Advised that prior medications have been prescribed by Dr. Modesta Messing at behavioral health. Advised that only recent changes were decreasing medications, not adding or increasing.   3. Verbal Skills- states that is not conversing with staff or MD. Durward Fortes that patient will talk if she likes you, but is very strong willed and will not converse with staff if she is not comfortable with them.   Answered all concerns via telephone.   Requested last OV for review. Faxed to [facility] [phone] fax.  MD to be made aware.

## 2018-08-29 ENCOUNTER — Telehealth: Payer: Self-pay | Admitting: Family Medicine

## 2018-08-29 ENCOUNTER — Other Ambulatory Visit: Payer: Self-pay | Admitting: *Deleted

## 2018-08-29 NOTE — Telephone Encounter (Signed)
Returned call to [place], [place] with the [facility] in [place].   Inquired as to Fu and PNA immunizations.   Immunization record faxed.

## 2018-08-29 NOTE — Telephone Encounter (Signed)
Nurse [facility] called wanting to know about pts immunizations. Please call her back at your earliest convenience.

## 2018-08-29 NOTE — Patient Outreach (Signed)
Member assessed for potential [facility] Care Management program services as benefit of her [employer] Medicare insurance.  Member discussed in the telephonic IDT meeting with [facility] team and [facility] discharge planner.   Discharge planner indicates member is from ALF. However, disposition plans are likely for long term care placement.   Will plan to sign off once disposition plans are definitive for either ALF or LTC.  Will continue to follow in the meantime and collaborate with [facility] UM team and facility staff.    Marthenia Rolling, MSN-Ed, RN,BSN [place] Acute Care Coordinator [phone] [facility]) [phone]  (Toll free office)

## 2018-09-02 DIAGNOSIS — N390 Urinary tract infection, site not specified: Secondary | ICD-10-CM | POA: Diagnosis not present

## 2018-09-02 DIAGNOSIS — G2401 Drug induced subacute dyskinesia: Secondary | ICD-10-CM | POA: Diagnosis not present

## 2018-09-02 DIAGNOSIS — I10 Essential (primary) hypertension: Secondary | ICD-10-CM | POA: Diagnosis not present

## 2018-09-05 ENCOUNTER — Other Ambulatory Visit: Payer: Self-pay | Admitting: *Deleted

## 2018-09-05 NOTE — Patient Outreach (Signed)
Member assessed for potential [facility] Care Management needs as a benefit of  [place] [employer].  Member is currently at [facility] receiving rehab therapy.   Member discussed in weekly telephonic IDT meeting with facility discharge planner, [facility] UM RN, and Probation officer.  Facility discharge planner confirms disposition plans are for long term care.   Writer to sign off. No identifiable [facility] Care Management needs at this time.   Marthenia Rolling, MSN-Ed, RN,BSN [place] Acute Care Coordinator [phone] [facility]) [phone]  (Toll free office)

## 2018-09-10 DIAGNOSIS — E119 Type 2 diabetes mellitus without complications: Secondary | ICD-10-CM | POA: Diagnosis not present

## 2018-09-10 DIAGNOSIS — F319 Bipolar disorder, unspecified: Secondary | ICD-10-CM | POA: Diagnosis not present

## 2018-09-11 DIAGNOSIS — I69391 Dysphagia following cerebral infarction: Secondary | ICD-10-CM | POA: Diagnosis not present

## 2018-09-11 DIAGNOSIS — F319 Bipolar disorder, unspecified: Secondary | ICD-10-CM | POA: Diagnosis not present

## 2018-09-11 DIAGNOSIS — I69328 Other speech and language deficits following cerebral infarction: Secondary | ICD-10-CM | POA: Diagnosis not present

## 2018-09-11 DIAGNOSIS — I10 Essential (primary) hypertension: Secondary | ICD-10-CM | POA: Diagnosis not present

## 2018-09-11 DIAGNOSIS — I69354 Hemiplegia and hemiparesis following cerebral infarction affecting left non-dominant side: Secondary | ICD-10-CM | POA: Diagnosis not present

## 2018-09-11 DIAGNOSIS — M6281 Muscle weakness (generalized): Secondary | ICD-10-CM | POA: Diagnosis not present

## 2018-09-11 DIAGNOSIS — R279 Unspecified lack of coordination: Secondary | ICD-10-CM | POA: Diagnosis not present

## 2018-09-12 DIAGNOSIS — I69354 Hemiplegia and hemiparesis following cerebral infarction affecting left non-dominant side: Secondary | ICD-10-CM | POA: Diagnosis not present

## 2018-09-12 DIAGNOSIS — I69328 Other speech and language deficits following cerebral infarction: Secondary | ICD-10-CM | POA: Diagnosis not present

## 2018-09-12 DIAGNOSIS — I69391 Dysphagia following cerebral infarction: Secondary | ICD-10-CM | POA: Diagnosis not present

## 2018-09-12 DIAGNOSIS — R279 Unspecified lack of coordination: Secondary | ICD-10-CM | POA: Diagnosis not present

## 2018-09-12 DIAGNOSIS — M6281 Muscle weakness (generalized): Secondary | ICD-10-CM | POA: Diagnosis not present

## 2018-09-13 DIAGNOSIS — I69328 Other speech and language deficits following cerebral infarction: Secondary | ICD-10-CM | POA: Diagnosis not present

## 2018-09-13 DIAGNOSIS — M6281 Muscle weakness (generalized): Secondary | ICD-10-CM | POA: Diagnosis not present

## 2018-09-13 DIAGNOSIS — R400 Somnolence: Secondary | ICD-10-CM | POA: Diagnosis not present

## 2018-09-13 DIAGNOSIS — E039 Hypothyroidism, unspecified: Secondary | ICD-10-CM | POA: Diagnosis not present

## 2018-09-13 DIAGNOSIS — R630 Anorexia: Secondary | ICD-10-CM | POA: Diagnosis not present

## 2018-09-13 DIAGNOSIS — I69354 Hemiplegia and hemiparesis following cerebral infarction affecting left non-dominant side: Secondary | ICD-10-CM | POA: Diagnosis not present

## 2018-09-13 DIAGNOSIS — R279 Unspecified lack of coordination: Secondary | ICD-10-CM | POA: Diagnosis not present

## 2018-09-13 DIAGNOSIS — I69391 Dysphagia following cerebral infarction: Secondary | ICD-10-CM | POA: Diagnosis not present

## 2018-09-13 DIAGNOSIS — D649 Anemia, unspecified: Secondary | ICD-10-CM | POA: Diagnosis not present

## 2018-09-16 DIAGNOSIS — I69391 Dysphagia following cerebral infarction: Secondary | ICD-10-CM | POA: Diagnosis not present

## 2018-09-16 DIAGNOSIS — I69354 Hemiplegia and hemiparesis following cerebral infarction affecting left non-dominant side: Secondary | ICD-10-CM | POA: Diagnosis not present

## 2018-09-16 DIAGNOSIS — M6281 Muscle weakness (generalized): Secondary | ICD-10-CM | POA: Diagnosis not present

## 2018-09-16 DIAGNOSIS — R279 Unspecified lack of coordination: Secondary | ICD-10-CM | POA: Diagnosis not present

## 2018-09-16 DIAGNOSIS — I69328 Other speech and language deficits following cerebral infarction: Secondary | ICD-10-CM | POA: Diagnosis not present

## 2018-09-17 DIAGNOSIS — M6281 Muscle weakness (generalized): Secondary | ICD-10-CM | POA: Diagnosis not present

## 2018-09-17 DIAGNOSIS — I69354 Hemiplegia and hemiparesis following cerebral infarction affecting left non-dominant side: Secondary | ICD-10-CM | POA: Diagnosis not present

## 2018-09-17 DIAGNOSIS — D649 Anemia, unspecified: Secondary | ICD-10-CM | POA: Diagnosis not present

## 2018-09-17 DIAGNOSIS — I10 Essential (primary) hypertension: Secondary | ICD-10-CM | POA: Diagnosis not present

## 2018-09-17 DIAGNOSIS — I69328 Other speech and language deficits following cerebral infarction: Secondary | ICD-10-CM | POA: Diagnosis not present

## 2018-09-17 DIAGNOSIS — R279 Unspecified lack of coordination: Secondary | ICD-10-CM | POA: Diagnosis not present

## 2018-09-17 DIAGNOSIS — E039 Hypothyroidism, unspecified: Secondary | ICD-10-CM | POA: Diagnosis not present

## 2018-09-17 DIAGNOSIS — I69391 Dysphagia following cerebral infarction: Secondary | ICD-10-CM | POA: Diagnosis not present

## 2018-09-18 DIAGNOSIS — I69391 Dysphagia following cerebral infarction: Secondary | ICD-10-CM | POA: Diagnosis not present

## 2018-09-18 DIAGNOSIS — I69354 Hemiplegia and hemiparesis following cerebral infarction affecting left non-dominant side: Secondary | ICD-10-CM | POA: Diagnosis not present

## 2018-09-18 DIAGNOSIS — R279 Unspecified lack of coordination: Secondary | ICD-10-CM | POA: Diagnosis not present

## 2018-09-18 DIAGNOSIS — I69328 Other speech and language deficits following cerebral infarction: Secondary | ICD-10-CM | POA: Diagnosis not present

## 2018-09-18 DIAGNOSIS — M6281 Muscle weakness (generalized): Secondary | ICD-10-CM | POA: Diagnosis not present

## 2018-09-19 DIAGNOSIS — I69391 Dysphagia following cerebral infarction: Secondary | ICD-10-CM | POA: Diagnosis not present

## 2018-09-19 DIAGNOSIS — I69354 Hemiplegia and hemiparesis following cerebral infarction affecting left non-dominant side: Secondary | ICD-10-CM | POA: Diagnosis not present

## 2018-09-19 DIAGNOSIS — R279 Unspecified lack of coordination: Secondary | ICD-10-CM | POA: Diagnosis not present

## 2018-09-19 DIAGNOSIS — M6281 Muscle weakness (generalized): Secondary | ICD-10-CM | POA: Diagnosis not present

## 2018-09-19 DIAGNOSIS — I69328 Other speech and language deficits following cerebral infarction: Secondary | ICD-10-CM | POA: Diagnosis not present

## 2018-09-20 DIAGNOSIS — M6281 Muscle weakness (generalized): Secondary | ICD-10-CM | POA: Diagnosis not present

## 2018-09-20 DIAGNOSIS — R279 Unspecified lack of coordination: Secondary | ICD-10-CM | POA: Diagnosis not present

## 2018-09-20 DIAGNOSIS — I69328 Other speech and language deficits following cerebral infarction: Secondary | ICD-10-CM | POA: Diagnosis not present

## 2018-09-20 DIAGNOSIS — I69354 Hemiplegia and hemiparesis following cerebral infarction affecting left non-dominant side: Secondary | ICD-10-CM | POA: Diagnosis not present

## 2018-09-20 DIAGNOSIS — I69391 Dysphagia following cerebral infarction: Secondary | ICD-10-CM | POA: Diagnosis not present

## 2018-09-23 DIAGNOSIS — I69328 Other speech and language deficits following cerebral infarction: Secondary | ICD-10-CM | POA: Diagnosis not present

## 2018-09-23 DIAGNOSIS — I69354 Hemiplegia and hemiparesis following cerebral infarction affecting left non-dominant side: Secondary | ICD-10-CM | POA: Diagnosis not present

## 2018-09-23 DIAGNOSIS — I69391 Dysphagia following cerebral infarction: Secondary | ICD-10-CM | POA: Diagnosis not present

## 2018-09-23 DIAGNOSIS — M6281 Muscle weakness (generalized): Secondary | ICD-10-CM | POA: Diagnosis not present

## 2018-09-23 DIAGNOSIS — R279 Unspecified lack of coordination: Secondary | ICD-10-CM | POA: Diagnosis not present

## 2018-09-24 DIAGNOSIS — I69328 Other speech and language deficits following cerebral infarction: Secondary | ICD-10-CM | POA: Diagnosis not present

## 2018-09-24 DIAGNOSIS — M6281 Muscle weakness (generalized): Secondary | ICD-10-CM | POA: Diagnosis not present

## 2018-09-24 DIAGNOSIS — I69354 Hemiplegia and hemiparesis following cerebral infarction affecting left non-dominant side: Secondary | ICD-10-CM | POA: Diagnosis not present

## 2018-09-24 DIAGNOSIS — R279 Unspecified lack of coordination: Secondary | ICD-10-CM | POA: Diagnosis not present

## 2018-09-24 DIAGNOSIS — I69391 Dysphagia following cerebral infarction: Secondary | ICD-10-CM | POA: Diagnosis not present

## 2018-09-25 DIAGNOSIS — I69354 Hemiplegia and hemiparesis following cerebral infarction affecting left non-dominant side: Secondary | ICD-10-CM | POA: Diagnosis not present

## 2018-09-25 DIAGNOSIS — I69391 Dysphagia following cerebral infarction: Secondary | ICD-10-CM | POA: Diagnosis not present

## 2018-09-25 DIAGNOSIS — M6281 Muscle weakness (generalized): Secondary | ICD-10-CM | POA: Diagnosis not present

## 2018-09-25 DIAGNOSIS — I69328 Other speech and language deficits following cerebral infarction: Secondary | ICD-10-CM | POA: Diagnosis not present

## 2018-09-25 DIAGNOSIS — R279 Unspecified lack of coordination: Secondary | ICD-10-CM | POA: Diagnosis not present

## 2018-09-26 DIAGNOSIS — I69328 Other speech and language deficits following cerebral infarction: Secondary | ICD-10-CM | POA: Diagnosis not present

## 2018-09-26 DIAGNOSIS — M6281 Muscle weakness (generalized): Secondary | ICD-10-CM | POA: Diagnosis not present

## 2018-09-26 DIAGNOSIS — R279 Unspecified lack of coordination: Secondary | ICD-10-CM | POA: Diagnosis not present

## 2018-09-26 DIAGNOSIS — I69354 Hemiplegia and hemiparesis following cerebral infarction affecting left non-dominant side: Secondary | ICD-10-CM | POA: Diagnosis not present

## 2018-09-26 DIAGNOSIS — I69391 Dysphagia following cerebral infarction: Secondary | ICD-10-CM | POA: Diagnosis not present

## 2018-09-27 DIAGNOSIS — I69328 Other speech and language deficits following cerebral infarction: Secondary | ICD-10-CM | POA: Diagnosis not present

## 2018-09-27 DIAGNOSIS — R279 Unspecified lack of coordination: Secondary | ICD-10-CM | POA: Diagnosis not present

## 2018-09-27 DIAGNOSIS — M6281 Muscle weakness (generalized): Secondary | ICD-10-CM | POA: Diagnosis not present

## 2018-09-27 DIAGNOSIS — I69391 Dysphagia following cerebral infarction: Secondary | ICD-10-CM | POA: Diagnosis not present

## 2018-09-27 DIAGNOSIS — I69354 Hemiplegia and hemiparesis following cerebral infarction affecting left non-dominant side: Secondary | ICD-10-CM | POA: Diagnosis not present

## 2018-09-28 DIAGNOSIS — I69391 Dysphagia following cerebral infarction: Secondary | ICD-10-CM | POA: Diagnosis not present

## 2018-09-28 DIAGNOSIS — I69354 Hemiplegia and hemiparesis following cerebral infarction affecting left non-dominant side: Secondary | ICD-10-CM | POA: Diagnosis not present

## 2018-09-28 DIAGNOSIS — R279 Unspecified lack of coordination: Secondary | ICD-10-CM | POA: Diagnosis not present

## 2018-09-28 DIAGNOSIS — I69328 Other speech and language deficits following cerebral infarction: Secondary | ICD-10-CM | POA: Diagnosis not present

## 2018-09-28 DIAGNOSIS — M6281 Muscle weakness (generalized): Secondary | ICD-10-CM | POA: Diagnosis not present

## 2018-09-30 DIAGNOSIS — R279 Unspecified lack of coordination: Secondary | ICD-10-CM | POA: Diagnosis not present

## 2018-09-30 DIAGNOSIS — I69354 Hemiplegia and hemiparesis following cerebral infarction affecting left non-dominant side: Secondary | ICD-10-CM | POA: Diagnosis not present

## 2018-09-30 DIAGNOSIS — I69391 Dysphagia following cerebral infarction: Secondary | ICD-10-CM | POA: Diagnosis not present

## 2018-09-30 DIAGNOSIS — I69328 Other speech and language deficits following cerebral infarction: Secondary | ICD-10-CM | POA: Diagnosis not present

## 2018-09-30 DIAGNOSIS — M6281 Muscle weakness (generalized): Secondary | ICD-10-CM | POA: Diagnosis not present

## 2018-10-01 DIAGNOSIS — R279 Unspecified lack of coordination: Secondary | ICD-10-CM | POA: Diagnosis not present

## 2018-10-01 DIAGNOSIS — M6281 Muscle weakness (generalized): Secondary | ICD-10-CM | POA: Diagnosis not present

## 2018-10-01 DIAGNOSIS — I69391 Dysphagia following cerebral infarction: Secondary | ICD-10-CM | POA: Diagnosis not present

## 2018-10-01 DIAGNOSIS — I69354 Hemiplegia and hemiparesis following cerebral infarction affecting left non-dominant side: Secondary | ICD-10-CM | POA: Diagnosis not present

## 2018-10-01 DIAGNOSIS — I69328 Other speech and language deficits following cerebral infarction: Secondary | ICD-10-CM | POA: Diagnosis not present

## 2018-10-02 DIAGNOSIS — I69354 Hemiplegia and hemiparesis following cerebral infarction affecting left non-dominant side: Secondary | ICD-10-CM | POA: Diagnosis not present

## 2018-10-02 DIAGNOSIS — R279 Unspecified lack of coordination: Secondary | ICD-10-CM | POA: Diagnosis not present

## 2018-10-02 DIAGNOSIS — M6281 Muscle weakness (generalized): Secondary | ICD-10-CM | POA: Diagnosis not present

## 2018-10-02 DIAGNOSIS — I69391 Dysphagia following cerebral infarction: Secondary | ICD-10-CM | POA: Diagnosis not present

## 2018-10-02 DIAGNOSIS — I69328 Other speech and language deficits following cerebral infarction: Secondary | ICD-10-CM | POA: Diagnosis not present

## 2018-10-03 DIAGNOSIS — I69354 Hemiplegia and hemiparesis following cerebral infarction affecting left non-dominant side: Secondary | ICD-10-CM | POA: Diagnosis not present

## 2018-10-03 DIAGNOSIS — I69328 Other speech and language deficits following cerebral infarction: Secondary | ICD-10-CM | POA: Diagnosis not present

## 2018-10-03 DIAGNOSIS — R279 Unspecified lack of coordination: Secondary | ICD-10-CM | POA: Diagnosis not present

## 2018-10-03 DIAGNOSIS — I69391 Dysphagia following cerebral infarction: Secondary | ICD-10-CM | POA: Diagnosis not present

## 2018-10-03 DIAGNOSIS — M6281 Muscle weakness (generalized): Secondary | ICD-10-CM | POA: Diagnosis not present

## 2018-10-03 DIAGNOSIS — G2401 Drug induced subacute dyskinesia: Secondary | ICD-10-CM | POA: Diagnosis not present

## 2018-10-03 DIAGNOSIS — I10 Essential (primary) hypertension: Secondary | ICD-10-CM | POA: Diagnosis not present

## 2018-10-03 DIAGNOSIS — H409 Unspecified glaucoma: Secondary | ICD-10-CM | POA: Diagnosis not present

## 2018-10-04 DIAGNOSIS — I69354 Hemiplegia and hemiparesis following cerebral infarction affecting left non-dominant side: Secondary | ICD-10-CM | POA: Diagnosis not present

## 2018-10-04 DIAGNOSIS — I69391 Dysphagia following cerebral infarction: Secondary | ICD-10-CM | POA: Diagnosis not present

## 2018-10-04 DIAGNOSIS — M6281 Muscle weakness (generalized): Secondary | ICD-10-CM | POA: Diagnosis not present

## 2018-10-04 DIAGNOSIS — I69328 Other speech and language deficits following cerebral infarction: Secondary | ICD-10-CM | POA: Diagnosis not present

## 2018-10-04 DIAGNOSIS — R279 Unspecified lack of coordination: Secondary | ICD-10-CM | POA: Diagnosis not present

## 2018-10-07 DIAGNOSIS — I69354 Hemiplegia and hemiparesis following cerebral infarction affecting left non-dominant side: Secondary | ICD-10-CM | POA: Diagnosis not present

## 2018-10-07 DIAGNOSIS — I69391 Dysphagia following cerebral infarction: Secondary | ICD-10-CM | POA: Diagnosis not present

## 2018-10-07 DIAGNOSIS — R279 Unspecified lack of coordination: Secondary | ICD-10-CM | POA: Diagnosis not present

## 2018-10-07 DIAGNOSIS — M6281 Muscle weakness (generalized): Secondary | ICD-10-CM | POA: Diagnosis not present

## 2018-10-07 DIAGNOSIS — I69328 Other speech and language deficits following cerebral infarction: Secondary | ICD-10-CM | POA: Diagnosis not present

## 2018-10-07 DIAGNOSIS — F250 Schizoaffective disorder, bipolar type: Secondary | ICD-10-CM | POA: Diagnosis not present

## 2018-10-08 DIAGNOSIS — R279 Unspecified lack of coordination: Secondary | ICD-10-CM | POA: Diagnosis not present

## 2018-10-08 DIAGNOSIS — M6281 Muscle weakness (generalized): Secondary | ICD-10-CM | POA: Diagnosis not present

## 2018-10-08 DIAGNOSIS — I69391 Dysphagia following cerebral infarction: Secondary | ICD-10-CM | POA: Diagnosis not present

## 2018-10-08 DIAGNOSIS — I69328 Other speech and language deficits following cerebral infarction: Secondary | ICD-10-CM | POA: Diagnosis not present

## 2018-10-08 DIAGNOSIS — I69354 Hemiplegia and hemiparesis following cerebral infarction affecting left non-dominant side: Secondary | ICD-10-CM | POA: Diagnosis not present

## 2018-10-09 DIAGNOSIS — M6281 Muscle weakness (generalized): Secondary | ICD-10-CM | POA: Diagnosis not present

## 2018-10-09 DIAGNOSIS — R279 Unspecified lack of coordination: Secondary | ICD-10-CM | POA: Diagnosis not present

## 2018-10-09 DIAGNOSIS — I69328 Other speech and language deficits following cerebral infarction: Secondary | ICD-10-CM | POA: Diagnosis not present

## 2018-10-09 DIAGNOSIS — I69354 Hemiplegia and hemiparesis following cerebral infarction affecting left non-dominant side: Secondary | ICD-10-CM | POA: Diagnosis not present

## 2018-10-09 DIAGNOSIS — I69391 Dysphagia following cerebral infarction: Secondary | ICD-10-CM | POA: Diagnosis not present

## 2018-10-10 DIAGNOSIS — M549 Dorsalgia, unspecified: Secondary | ICD-10-CM | POA: Diagnosis not present

## 2018-10-10 DIAGNOSIS — R279 Unspecified lack of coordination: Secondary | ICD-10-CM | POA: Diagnosis not present

## 2018-10-10 DIAGNOSIS — M6281 Muscle weakness (generalized): Secondary | ICD-10-CM | POA: Diagnosis not present

## 2018-10-10 DIAGNOSIS — I69391 Dysphagia following cerebral infarction: Secondary | ICD-10-CM | POA: Diagnosis not present

## 2018-10-10 DIAGNOSIS — I69328 Other speech and language deficits following cerebral infarction: Secondary | ICD-10-CM | POA: Diagnosis not present

## 2018-10-10 DIAGNOSIS — I69354 Hemiplegia and hemiparesis following cerebral infarction affecting left non-dominant side: Secondary | ICD-10-CM | POA: Diagnosis not present

## 2018-10-11 DIAGNOSIS — R319 Hematuria, unspecified: Secondary | ICD-10-CM | POA: Diagnosis not present

## 2018-10-11 DIAGNOSIS — N390 Urinary tract infection, site not specified: Secondary | ICD-10-CM | POA: Diagnosis not present

## 2018-10-15 DIAGNOSIS — R262 Difficulty in walking, not elsewhere classified: Secondary | ICD-10-CM | POA: Diagnosis not present

## 2018-10-15 DIAGNOSIS — U071 COVID-19: Secondary | ICD-10-CM | POA: Diagnosis not present

## 2018-10-15 DIAGNOSIS — M6281 Muscle weakness (generalized): Secondary | ICD-10-CM | POA: Diagnosis not present

## 2018-10-15 DIAGNOSIS — I69391 Dysphagia following cerebral infarction: Secondary | ICD-10-CM | POA: Diagnosis not present

## 2018-10-15 DIAGNOSIS — I69328 Other speech and language deficits following cerebral infarction: Secondary | ICD-10-CM | POA: Diagnosis not present

## 2018-10-17 DIAGNOSIS — I69391 Dysphagia following cerebral infarction: Secondary | ICD-10-CM | POA: Diagnosis not present

## 2018-10-17 DIAGNOSIS — I69328 Other speech and language deficits following cerebral infarction: Secondary | ICD-10-CM | POA: Diagnosis not present

## 2018-10-17 DIAGNOSIS — R262 Difficulty in walking, not elsewhere classified: Secondary | ICD-10-CM | POA: Diagnosis not present

## 2018-10-17 DIAGNOSIS — U071 COVID-19: Secondary | ICD-10-CM | POA: Diagnosis not present

## 2018-10-17 DIAGNOSIS — M6281 Muscle weakness (generalized): Secondary | ICD-10-CM | POA: Diagnosis not present

## 2018-10-18 DIAGNOSIS — R262 Difficulty in walking, not elsewhere classified: Secondary | ICD-10-CM | POA: Diagnosis not present

## 2018-10-18 DIAGNOSIS — Z20828 Contact with and (suspected) exposure to other viral communicable diseases: Secondary | ICD-10-CM | POA: Diagnosis not present

## 2018-10-18 DIAGNOSIS — M6281 Muscle weakness (generalized): Secondary | ICD-10-CM | POA: Diagnosis not present

## 2018-10-18 DIAGNOSIS — I69328 Other speech and language deficits following cerebral infarction: Secondary | ICD-10-CM | POA: Diagnosis not present

## 2018-10-18 DIAGNOSIS — I69391 Dysphagia following cerebral infarction: Secondary | ICD-10-CM | POA: Diagnosis not present

## 2018-10-18 DIAGNOSIS — U071 COVID-19: Secondary | ICD-10-CM | POA: Diagnosis not present

## 2018-10-21 DIAGNOSIS — I69328 Other speech and language deficits following cerebral infarction: Secondary | ICD-10-CM | POA: Diagnosis not present

## 2018-10-21 DIAGNOSIS — U071 COVID-19: Secondary | ICD-10-CM | POA: Diagnosis not present

## 2018-10-21 DIAGNOSIS — I69391 Dysphagia following cerebral infarction: Secondary | ICD-10-CM | POA: Diagnosis not present

## 2018-10-21 DIAGNOSIS — R262 Difficulty in walking, not elsewhere classified: Secondary | ICD-10-CM | POA: Diagnosis not present

## 2018-10-21 DIAGNOSIS — M6281 Muscle weakness (generalized): Secondary | ICD-10-CM | POA: Diagnosis not present

## 2018-10-22 DIAGNOSIS — M6281 Muscle weakness (generalized): Secondary | ICD-10-CM | POA: Diagnosis not present

## 2018-10-22 DIAGNOSIS — U071 COVID-19: Secondary | ICD-10-CM | POA: Diagnosis not present

## 2018-10-22 DIAGNOSIS — R262 Difficulty in walking, not elsewhere classified: Secondary | ICD-10-CM | POA: Diagnosis not present

## 2018-10-22 DIAGNOSIS — I69391 Dysphagia following cerebral infarction: Secondary | ICD-10-CM | POA: Diagnosis not present

## 2018-10-22 DIAGNOSIS — I69328 Other speech and language deficits following cerebral infarction: Secondary | ICD-10-CM | POA: Diagnosis not present

## 2018-10-25 DIAGNOSIS — R262 Difficulty in walking, not elsewhere classified: Secondary | ICD-10-CM | POA: Diagnosis not present

## 2018-10-25 DIAGNOSIS — U071 COVID-19: Secondary | ICD-10-CM | POA: Diagnosis not present

## 2018-10-25 DIAGNOSIS — I69391 Dysphagia following cerebral infarction: Secondary | ICD-10-CM | POA: Diagnosis not present

## 2018-10-25 DIAGNOSIS — I69328 Other speech and language deficits following cerebral infarction: Secondary | ICD-10-CM | POA: Diagnosis not present

## 2018-10-25 DIAGNOSIS — M6281 Muscle weakness (generalized): Secondary | ICD-10-CM | POA: Diagnosis not present

## 2018-10-28 DIAGNOSIS — I69328 Other speech and language deficits following cerebral infarction: Secondary | ICD-10-CM | POA: Diagnosis not present

## 2018-10-28 DIAGNOSIS — I69391 Dysphagia following cerebral infarction: Secondary | ICD-10-CM | POA: Diagnosis not present

## 2018-10-28 DIAGNOSIS — U071 COVID-19: Secondary | ICD-10-CM | POA: Diagnosis not present

## 2018-10-28 DIAGNOSIS — M6281 Muscle weakness (generalized): Secondary | ICD-10-CM | POA: Diagnosis not present

## 2018-10-28 DIAGNOSIS — R262 Difficulty in walking, not elsewhere classified: Secondary | ICD-10-CM | POA: Diagnosis not present

## 2018-10-29 DIAGNOSIS — U071 COVID-19: Secondary | ICD-10-CM | POA: Diagnosis not present

## 2018-10-29 DIAGNOSIS — R262 Difficulty in walking, not elsewhere classified: Secondary | ICD-10-CM | POA: Diagnosis not present

## 2018-10-29 DIAGNOSIS — I69391 Dysphagia following cerebral infarction: Secondary | ICD-10-CM | POA: Diagnosis not present

## 2018-10-29 DIAGNOSIS — I69328 Other speech and language deficits following cerebral infarction: Secondary | ICD-10-CM | POA: Diagnosis not present

## 2018-10-29 DIAGNOSIS — M6281 Muscle weakness (generalized): Secondary | ICD-10-CM | POA: Diagnosis not present

## 2018-10-30 DIAGNOSIS — U071 COVID-19: Secondary | ICD-10-CM | POA: Diagnosis not present

## 2018-10-30 DIAGNOSIS — I69391 Dysphagia following cerebral infarction: Secondary | ICD-10-CM | POA: Diagnosis not present

## 2018-10-30 DIAGNOSIS — R262 Difficulty in walking, not elsewhere classified: Secondary | ICD-10-CM | POA: Diagnosis not present

## 2018-10-30 DIAGNOSIS — M6281 Muscle weakness (generalized): Secondary | ICD-10-CM | POA: Diagnosis not present

## 2018-10-30 DIAGNOSIS — I69328 Other speech and language deficits following cerebral infarction: Secondary | ICD-10-CM | POA: Diagnosis not present

## 2018-10-31 DIAGNOSIS — U071 COVID-19: Secondary | ICD-10-CM | POA: Diagnosis not present

## 2018-10-31 DIAGNOSIS — M6281 Muscle weakness (generalized): Secondary | ICD-10-CM | POA: Diagnosis not present

## 2018-10-31 DIAGNOSIS — I69328 Other speech and language deficits following cerebral infarction: Secondary | ICD-10-CM | POA: Diagnosis not present

## 2018-10-31 DIAGNOSIS — I69391 Dysphagia following cerebral infarction: Secondary | ICD-10-CM | POA: Diagnosis not present

## 2018-10-31 DIAGNOSIS — R262 Difficulty in walking, not elsewhere classified: Secondary | ICD-10-CM | POA: Diagnosis not present

## 2018-11-01 DIAGNOSIS — M6281 Muscle weakness (generalized): Secondary | ICD-10-CM | POA: Diagnosis not present

## 2018-11-01 DIAGNOSIS — R262 Difficulty in walking, not elsewhere classified: Secondary | ICD-10-CM | POA: Diagnosis not present

## 2018-11-01 DIAGNOSIS — U071 COVID-19: Secondary | ICD-10-CM | POA: Diagnosis not present

## 2018-11-01 DIAGNOSIS — I69328 Other speech and language deficits following cerebral infarction: Secondary | ICD-10-CM | POA: Diagnosis not present

## 2018-11-01 DIAGNOSIS — Z20828 Contact with and (suspected) exposure to other viral communicable diseases: Secondary | ICD-10-CM | POA: Diagnosis not present

## 2018-11-01 DIAGNOSIS — I69391 Dysphagia following cerebral infarction: Secondary | ICD-10-CM | POA: Diagnosis not present

## 2018-11-04 DIAGNOSIS — Z7401 Bed confinement status: Secondary | ICD-10-CM | POA: Diagnosis not present

## 2018-11-04 DIAGNOSIS — E039 Hypothyroidism, unspecified: Secondary | ICD-10-CM | POA: Diagnosis not present

## 2018-11-04 DIAGNOSIS — E119 Type 2 diabetes mellitus without complications: Secondary | ICD-10-CM | POA: Diagnosis present

## 2018-11-04 DIAGNOSIS — R001 Bradycardia, unspecified: Secondary | ICD-10-CM | POA: Diagnosis not present

## 2018-11-04 DIAGNOSIS — N3001 Acute cystitis with hematuria: Secondary | ICD-10-CM | POA: Diagnosis not present

## 2018-11-04 DIAGNOSIS — E46 Unspecified protein-calorie malnutrition: Secondary | ICD-10-CM | POA: Diagnosis not present

## 2018-11-04 DIAGNOSIS — E878 Other disorders of electrolyte and fluid balance, not elsewhere classified: Secondary | ICD-10-CM | POA: Diagnosis not present

## 2018-11-04 DIAGNOSIS — F319 Bipolar disorder, unspecified: Secondary | ICD-10-CM | POA: Diagnosis present

## 2018-11-04 DIAGNOSIS — R630 Anorexia: Secondary | ICD-10-CM | POA: Diagnosis not present

## 2018-11-04 DIAGNOSIS — Z79899 Other long term (current) drug therapy: Secondary | ICD-10-CM | POA: Diagnosis not present

## 2018-11-04 DIAGNOSIS — Z8673 Personal history of transient ischemic attack (TIA), and cerebral infarction without residual deficits: Secondary | ICD-10-CM | POA: Diagnosis not present

## 2018-11-04 DIAGNOSIS — E871 Hypo-osmolality and hyponatremia: Secondary | ICD-10-CM | POA: Diagnosis not present

## 2018-11-04 DIAGNOSIS — H409 Unspecified glaucoma: Secondary | ICD-10-CM | POA: Diagnosis not present

## 2018-11-04 DIAGNOSIS — J9600 Acute respiratory failure, unspecified whether with hypoxia or hypercapnia: Secondary | ICD-10-CM | POA: Diagnosis not present

## 2018-11-04 DIAGNOSIS — I6990 Unspecified sequelae of unspecified cerebrovascular disease: Secondary | ICD-10-CM | POA: Diagnosis not present

## 2018-11-04 DIAGNOSIS — Z970 Presence of artificial eye: Secondary | ICD-10-CM | POA: Diagnosis not present

## 2018-11-04 DIAGNOSIS — F0390 Unspecified dementia without behavioral disturbance: Secondary | ICD-10-CM | POA: Diagnosis present

## 2018-11-04 DIAGNOSIS — R262 Difficulty in walking, not elsewhere classified: Secondary | ICD-10-CM | POA: Diagnosis not present

## 2018-11-04 DIAGNOSIS — N390 Urinary tract infection, site not specified: Secondary | ICD-10-CM | POA: Diagnosis not present

## 2018-11-04 DIAGNOSIS — E870 Hyperosmolality and hypernatremia: Secondary | ICD-10-CM | POA: Diagnosis not present

## 2018-11-04 DIAGNOSIS — E785 Hyperlipidemia, unspecified: Secondary | ICD-10-CM | POA: Diagnosis not present

## 2018-11-04 DIAGNOSIS — R4182 Altered mental status, unspecified: Secondary | ICD-10-CM | POA: Diagnosis not present

## 2018-11-04 DIAGNOSIS — Z885 Allergy status to narcotic agent status: Secondary | ICD-10-CM | POA: Diagnosis not present

## 2018-11-04 DIAGNOSIS — I69328 Other speech and language deficits following cerebral infarction: Secondary | ICD-10-CM | POA: Diagnosis not present

## 2018-11-04 DIAGNOSIS — M6281 Muscle weakness (generalized): Secondary | ICD-10-CM | POA: Diagnosis not present

## 2018-11-04 DIAGNOSIS — U071 COVID-19: Secondary | ICD-10-CM | POA: Diagnosis not present

## 2018-11-04 DIAGNOSIS — Z4682 Encounter for fitting and adjustment of non-vascular catheter: Secondary | ICD-10-CM | POA: Diagnosis not present

## 2018-11-04 DIAGNOSIS — R000 Tachycardia, unspecified: Secondary | ICD-10-CM | POA: Diagnosis not present

## 2018-11-04 DIAGNOSIS — R400 Somnolence: Secondary | ICD-10-CM | POA: Diagnosis not present

## 2018-11-04 DIAGNOSIS — Z7982 Long term (current) use of aspirin: Secondary | ICD-10-CM | POA: Diagnosis not present

## 2018-11-04 DIAGNOSIS — I69354 Hemiplegia and hemiparesis following cerebral infarction affecting left non-dominant side: Secondary | ICD-10-CM | POA: Diagnosis not present

## 2018-11-04 DIAGNOSIS — I69391 Dysphagia following cerebral infarction: Secondary | ICD-10-CM | POA: Diagnosis not present

## 2018-11-04 DIAGNOSIS — Z7902 Long term (current) use of antithrombotics/antiplatelets: Secondary | ICD-10-CM | POA: Diagnosis not present

## 2018-11-04 DIAGNOSIS — R918 Other nonspecific abnormal finding of lung field: Secondary | ICD-10-CM | POA: Diagnosis not present

## 2018-11-04 DIAGNOSIS — I10 Essential (primary) hypertension: Secondary | ICD-10-CM | POA: Diagnosis not present

## 2018-11-04 DIAGNOSIS — Z23 Encounter for immunization: Secondary | ICD-10-CM | POA: Diagnosis not present

## 2018-11-04 DIAGNOSIS — R404 Transient alteration of awareness: Secondary | ICD-10-CM | POA: Diagnosis not present

## 2018-11-05 ENCOUNTER — Encounter: Payer: Medicare Other | Admitting: "Endocrinology

## 2018-11-06 NOTE — Progress Notes (Signed)
This encounter was created in error - please disregard.

## 2018-11-13 DIAGNOSIS — R4182 Altered mental status, unspecified: Secondary | ICD-10-CM | POA: Diagnosis not present

## 2018-11-13 DIAGNOSIS — E039 Hypothyroidism, unspecified: Secondary | ICD-10-CM | POA: Diagnosis not present

## 2018-11-14 DIAGNOSIS — N390 Urinary tract infection, site not specified: Secondary | ICD-10-CM | POA: Diagnosis not present

## 2018-11-14 DIAGNOSIS — H409 Unspecified glaucoma: Secondary | ICD-10-CM | POA: Diagnosis present

## 2018-11-14 DIAGNOSIS — F0390 Unspecified dementia without behavioral disturbance: Secondary | ICD-10-CM | POA: Diagnosis present

## 2018-11-14 DIAGNOSIS — Z7982 Long term (current) use of aspirin: Secondary | ICD-10-CM | POA: Diagnosis not present

## 2018-11-14 DIAGNOSIS — Z7401 Bed confinement status: Secondary | ICD-10-CM | POA: Diagnosis not present

## 2018-11-14 DIAGNOSIS — R001 Bradycardia, unspecified: Secondary | ICD-10-CM | POA: Diagnosis not present

## 2018-11-14 DIAGNOSIS — Z970 Presence of artificial eye: Secondary | ICD-10-CM | POA: Diagnosis not present

## 2018-11-14 DIAGNOSIS — F319 Bipolar disorder, unspecified: Secondary | ICD-10-CM | POA: Diagnosis not present

## 2018-11-14 DIAGNOSIS — U071 COVID-19: Secondary | ICD-10-CM | POA: Diagnosis not present

## 2018-11-14 DIAGNOSIS — E870 Hyperosmolality and hypernatremia: Secondary | ICD-10-CM | POA: Diagnosis not present

## 2018-11-14 DIAGNOSIS — Z23 Encounter for immunization: Secondary | ICD-10-CM | POA: Diagnosis not present

## 2018-11-14 DIAGNOSIS — R000 Tachycardia, unspecified: Secondary | ICD-10-CM | POA: Diagnosis not present

## 2018-11-14 DIAGNOSIS — E878 Other disorders of electrolyte and fluid balance, not elsewhere classified: Secondary | ICD-10-CM | POA: Diagnosis not present

## 2018-11-14 DIAGNOSIS — N3001 Acute cystitis with hematuria: Secondary | ICD-10-CM | POA: Diagnosis not present

## 2018-11-14 DIAGNOSIS — E039 Hypothyroidism, unspecified: Secondary | ICD-10-CM | POA: Diagnosis not present

## 2018-11-14 DIAGNOSIS — R404 Transient alteration of awareness: Secondary | ICD-10-CM | POA: Diagnosis not present

## 2018-11-14 DIAGNOSIS — E119 Type 2 diabetes mellitus without complications: Secondary | ICD-10-CM | POA: Diagnosis present

## 2018-11-14 DIAGNOSIS — Z7902 Long term (current) use of antithrombotics/antiplatelets: Secondary | ICD-10-CM | POA: Diagnosis not present

## 2018-11-14 DIAGNOSIS — E871 Hypo-osmolality and hyponatremia: Secondary | ICD-10-CM | POA: Diagnosis not present

## 2018-11-14 DIAGNOSIS — I10 Essential (primary) hypertension: Secondary | ICD-10-CM | POA: Diagnosis not present

## 2018-11-15 DIAGNOSIS — U071 COVID-19: Secondary | ICD-10-CM | POA: Diagnosis not present

## 2018-11-15 DIAGNOSIS — R000 Tachycardia, unspecified: Secondary | ICD-10-CM | POA: Diagnosis not present

## 2018-11-15 DIAGNOSIS — E870 Hyperosmolality and hypernatremia: Secondary | ICD-10-CM | POA: Diagnosis not present

## 2018-11-15 DIAGNOSIS — N390 Urinary tract infection, site not specified: Secondary | ICD-10-CM | POA: Diagnosis not present

## 2018-11-15 DIAGNOSIS — E878 Other disorders of electrolyte and fluid balance, not elsewhere classified: Secondary | ICD-10-CM | POA: Diagnosis not present

## 2018-11-20 DIAGNOSIS — E870 Hyperosmolality and hypernatremia: Secondary | ICD-10-CM | POA: Diagnosis not present

## 2018-11-20 DIAGNOSIS — E119 Type 2 diabetes mellitus without complications: Secondary | ICD-10-CM | POA: Diagnosis not present

## 2018-11-20 DIAGNOSIS — Z4682 Encounter for fitting and adjustment of non-vascular catheter: Secondary | ICD-10-CM | POA: Diagnosis not present

## 2018-11-20 DIAGNOSIS — Z885 Allergy status to narcotic agent status: Secondary | ICD-10-CM | POA: Diagnosis not present

## 2018-11-20 DIAGNOSIS — R918 Other nonspecific abnormal finding of lung field: Secondary | ICD-10-CM | POA: Diagnosis not present

## 2018-11-20 DIAGNOSIS — F0390 Unspecified dementia without behavioral disturbance: Secondary | ICD-10-CM | POA: Diagnosis not present

## 2018-11-20 DIAGNOSIS — Z7902 Long term (current) use of antithrombotics/antiplatelets: Secondary | ICD-10-CM | POA: Diagnosis not present

## 2018-11-20 DIAGNOSIS — Z7982 Long term (current) use of aspirin: Secondary | ICD-10-CM | POA: Diagnosis not present

## 2018-11-20 DIAGNOSIS — F319 Bipolar disorder, unspecified: Secondary | ICD-10-CM | POA: Diagnosis not present

## 2018-11-20 DIAGNOSIS — R404 Transient alteration of awareness: Secondary | ICD-10-CM | POA: Diagnosis not present

## 2018-11-20 DIAGNOSIS — Z79899 Other long term (current) drug therapy: Secondary | ICD-10-CM | POA: Diagnosis not present

## 2018-12-09 ENCOUNTER — Ambulatory Visit: Payer: Medicare Other | Admitting: "Endocrinology

## 2018-12-10 DIAGNOSIS — F250 Schizoaffective disorder, bipolar type: Secondary | ICD-10-CM | POA: Diagnosis not present

## 2018-12-17 DIAGNOSIS — I69328 Other speech and language deficits following cerebral infarction: Secondary | ICD-10-CM | POA: Diagnosis not present

## 2018-12-17 DIAGNOSIS — R262 Difficulty in walking, not elsewhere classified: Secondary | ICD-10-CM | POA: Diagnosis not present

## 2018-12-17 DIAGNOSIS — M6281 Muscle weakness (generalized): Secondary | ICD-10-CM | POA: Diagnosis not present

## 2018-12-17 DIAGNOSIS — I69391 Dysphagia following cerebral infarction: Secondary | ICD-10-CM | POA: Diagnosis not present

## 2018-12-17 DIAGNOSIS — I69354 Hemiplegia and hemiparesis following cerebral infarction affecting left non-dominant side: Secondary | ICD-10-CM | POA: Diagnosis not present

## 2018-12-18 ENCOUNTER — Ambulatory Visit: Payer: Medicare Other | Admitting: "Endocrinology

## 2018-12-18 DIAGNOSIS — I69354 Hemiplegia and hemiparesis following cerebral infarction affecting left non-dominant side: Secondary | ICD-10-CM | POA: Diagnosis not present

## 2018-12-18 DIAGNOSIS — R262 Difficulty in walking, not elsewhere classified: Secondary | ICD-10-CM | POA: Diagnosis not present

## 2018-12-18 DIAGNOSIS — I69391 Dysphagia following cerebral infarction: Secondary | ICD-10-CM | POA: Diagnosis not present

## 2018-12-18 DIAGNOSIS — I69328 Other speech and language deficits following cerebral infarction: Secondary | ICD-10-CM | POA: Diagnosis not present

## 2018-12-18 DIAGNOSIS — M6281 Muscle weakness (generalized): Secondary | ICD-10-CM | POA: Diagnosis not present

## 2018-12-19 DIAGNOSIS — M6281 Muscle weakness (generalized): Secondary | ICD-10-CM | POA: Diagnosis not present

## 2018-12-19 DIAGNOSIS — I69328 Other speech and language deficits following cerebral infarction: Secondary | ICD-10-CM | POA: Diagnosis not present

## 2018-12-19 DIAGNOSIS — I69354 Hemiplegia and hemiparesis following cerebral infarction affecting left non-dominant side: Secondary | ICD-10-CM | POA: Diagnosis not present

## 2018-12-19 DIAGNOSIS — I69391 Dysphagia following cerebral infarction: Secondary | ICD-10-CM | POA: Diagnosis not present

## 2018-12-19 DIAGNOSIS — R262 Difficulty in walking, not elsewhere classified: Secondary | ICD-10-CM | POA: Diagnosis not present

## 2018-12-20 DIAGNOSIS — I69354 Hemiplegia and hemiparesis following cerebral infarction affecting left non-dominant side: Secondary | ICD-10-CM | POA: Diagnosis not present

## 2018-12-20 DIAGNOSIS — I69391 Dysphagia following cerebral infarction: Secondary | ICD-10-CM | POA: Diagnosis not present

## 2018-12-20 DIAGNOSIS — M6281 Muscle weakness (generalized): Secondary | ICD-10-CM | POA: Diagnosis not present

## 2018-12-20 DIAGNOSIS — I69328 Other speech and language deficits following cerebral infarction: Secondary | ICD-10-CM | POA: Diagnosis not present

## 2018-12-20 DIAGNOSIS — R262 Difficulty in walking, not elsewhere classified: Secondary | ICD-10-CM | POA: Diagnosis not present

## 2018-12-23 DIAGNOSIS — R262 Difficulty in walking, not elsewhere classified: Secondary | ICD-10-CM | POA: Diagnosis not present

## 2018-12-23 DIAGNOSIS — I69354 Hemiplegia and hemiparesis following cerebral infarction affecting left non-dominant side: Secondary | ICD-10-CM | POA: Diagnosis not present

## 2018-12-23 DIAGNOSIS — I69328 Other speech and language deficits following cerebral infarction: Secondary | ICD-10-CM | POA: Diagnosis not present

## 2018-12-23 DIAGNOSIS — I69391 Dysphagia following cerebral infarction: Secondary | ICD-10-CM | POA: Diagnosis not present

## 2018-12-23 DIAGNOSIS — M6281 Muscle weakness (generalized): Secondary | ICD-10-CM | POA: Diagnosis not present

## 2018-12-24 DIAGNOSIS — M6281 Muscle weakness (generalized): Secondary | ICD-10-CM | POA: Diagnosis not present

## 2018-12-24 DIAGNOSIS — I69328 Other speech and language deficits following cerebral infarction: Secondary | ICD-10-CM | POA: Diagnosis not present

## 2018-12-24 DIAGNOSIS — R262 Difficulty in walking, not elsewhere classified: Secondary | ICD-10-CM | POA: Diagnosis not present

## 2018-12-24 DIAGNOSIS — I69354 Hemiplegia and hemiparesis following cerebral infarction affecting left non-dominant side: Secondary | ICD-10-CM | POA: Diagnosis not present

## 2018-12-24 DIAGNOSIS — I69391 Dysphagia following cerebral infarction: Secondary | ICD-10-CM | POA: Diagnosis not present

## 2018-12-25 DIAGNOSIS — I69391 Dysphagia following cerebral infarction: Secondary | ICD-10-CM | POA: Diagnosis not present

## 2018-12-25 DIAGNOSIS — R262 Difficulty in walking, not elsewhere classified: Secondary | ICD-10-CM | POA: Diagnosis not present

## 2018-12-25 DIAGNOSIS — I69354 Hemiplegia and hemiparesis following cerebral infarction affecting left non-dominant side: Secondary | ICD-10-CM | POA: Diagnosis not present

## 2018-12-25 DIAGNOSIS — M6281 Muscle weakness (generalized): Secondary | ICD-10-CM | POA: Diagnosis not present

## 2018-12-25 DIAGNOSIS — I69328 Other speech and language deficits following cerebral infarction: Secondary | ICD-10-CM | POA: Diagnosis not present

## 2018-12-26 DIAGNOSIS — I69328 Other speech and language deficits following cerebral infarction: Secondary | ICD-10-CM | POA: Diagnosis not present

## 2018-12-26 DIAGNOSIS — I69354 Hemiplegia and hemiparesis following cerebral infarction affecting left non-dominant side: Secondary | ICD-10-CM | POA: Diagnosis not present

## 2018-12-26 DIAGNOSIS — I69391 Dysphagia following cerebral infarction: Secondary | ICD-10-CM | POA: Diagnosis not present

## 2018-12-26 DIAGNOSIS — R262 Difficulty in walking, not elsewhere classified: Secondary | ICD-10-CM | POA: Diagnosis not present

## 2018-12-26 DIAGNOSIS — Z23 Encounter for immunization: Secondary | ICD-10-CM | POA: Diagnosis not present

## 2018-12-26 DIAGNOSIS — M6281 Muscle weakness (generalized): Secondary | ICD-10-CM | POA: Diagnosis not present

## 2018-12-27 DIAGNOSIS — I69391 Dysphagia following cerebral infarction: Secondary | ICD-10-CM | POA: Diagnosis not present

## 2018-12-27 DIAGNOSIS — M6281 Muscle weakness (generalized): Secondary | ICD-10-CM | POA: Diagnosis not present

## 2018-12-27 DIAGNOSIS — R262 Difficulty in walking, not elsewhere classified: Secondary | ICD-10-CM | POA: Diagnosis not present

## 2018-12-27 DIAGNOSIS — I69354 Hemiplegia and hemiparesis following cerebral infarction affecting left non-dominant side: Secondary | ICD-10-CM | POA: Diagnosis not present

## 2018-12-27 DIAGNOSIS — I69328 Other speech and language deficits following cerebral infarction: Secondary | ICD-10-CM | POA: Diagnosis not present

## 2018-12-30 DIAGNOSIS — I69354 Hemiplegia and hemiparesis following cerebral infarction affecting left non-dominant side: Secondary | ICD-10-CM | POA: Diagnosis not present

## 2018-12-30 DIAGNOSIS — M6281 Muscle weakness (generalized): Secondary | ICD-10-CM | POA: Diagnosis not present

## 2018-12-30 DIAGNOSIS — I69328 Other speech and language deficits following cerebral infarction: Secondary | ICD-10-CM | POA: Diagnosis not present

## 2018-12-30 DIAGNOSIS — R262 Difficulty in walking, not elsewhere classified: Secondary | ICD-10-CM | POA: Diagnosis not present

## 2018-12-30 DIAGNOSIS — I69391 Dysphagia following cerebral infarction: Secondary | ICD-10-CM | POA: Diagnosis not present

## 2018-12-31 DIAGNOSIS — M6281 Muscle weakness (generalized): Secondary | ICD-10-CM | POA: Diagnosis not present

## 2018-12-31 DIAGNOSIS — I10 Essential (primary) hypertension: Secondary | ICD-10-CM | POA: Diagnosis not present

## 2018-12-31 DIAGNOSIS — I69328 Other speech and language deficits following cerebral infarction: Secondary | ICD-10-CM | POA: Diagnosis not present

## 2018-12-31 DIAGNOSIS — I69354 Hemiplegia and hemiparesis following cerebral infarction affecting left non-dominant side: Secondary | ICD-10-CM | POA: Diagnosis not present

## 2018-12-31 DIAGNOSIS — H409 Unspecified glaucoma: Secondary | ICD-10-CM | POA: Diagnosis not present

## 2018-12-31 DIAGNOSIS — I69391 Dysphagia following cerebral infarction: Secondary | ICD-10-CM | POA: Diagnosis not present

## 2018-12-31 DIAGNOSIS — R262 Difficulty in walking, not elsewhere classified: Secondary | ICD-10-CM | POA: Diagnosis not present

## 2019-01-01 DIAGNOSIS — I69328 Other speech and language deficits following cerebral infarction: Secondary | ICD-10-CM | POA: Diagnosis not present

## 2019-01-01 DIAGNOSIS — I69354 Hemiplegia and hemiparesis following cerebral infarction affecting left non-dominant side: Secondary | ICD-10-CM | POA: Diagnosis not present

## 2019-01-01 DIAGNOSIS — M6281 Muscle weakness (generalized): Secondary | ICD-10-CM | POA: Diagnosis not present

## 2019-01-01 DIAGNOSIS — I69391 Dysphagia following cerebral infarction: Secondary | ICD-10-CM | POA: Diagnosis not present

## 2019-01-01 DIAGNOSIS — R262 Difficulty in walking, not elsewhere classified: Secondary | ICD-10-CM | POA: Diagnosis not present

## 2019-01-02 DIAGNOSIS — I69391 Dysphagia following cerebral infarction: Secondary | ICD-10-CM | POA: Diagnosis not present

## 2019-01-02 DIAGNOSIS — M6281 Muscle weakness (generalized): Secondary | ICD-10-CM | POA: Diagnosis not present

## 2019-01-02 DIAGNOSIS — I69354 Hemiplegia and hemiparesis following cerebral infarction affecting left non-dominant side: Secondary | ICD-10-CM | POA: Diagnosis not present

## 2019-01-02 DIAGNOSIS — I69328 Other speech and language deficits following cerebral infarction: Secondary | ICD-10-CM | POA: Diagnosis not present

## 2019-01-02 DIAGNOSIS — R262 Difficulty in walking, not elsewhere classified: Secondary | ICD-10-CM | POA: Diagnosis not present

## 2019-01-03 DIAGNOSIS — I69391 Dysphagia following cerebral infarction: Secondary | ICD-10-CM | POA: Diagnosis not present

## 2019-01-03 DIAGNOSIS — M6281 Muscle weakness (generalized): Secondary | ICD-10-CM | POA: Diagnosis not present

## 2019-01-03 DIAGNOSIS — R262 Difficulty in walking, not elsewhere classified: Secondary | ICD-10-CM | POA: Diagnosis not present

## 2019-01-03 DIAGNOSIS — I69328 Other speech and language deficits following cerebral infarction: Secondary | ICD-10-CM | POA: Diagnosis not present

## 2019-01-03 DIAGNOSIS — I69354 Hemiplegia and hemiparesis following cerebral infarction affecting left non-dominant side: Secondary | ICD-10-CM | POA: Diagnosis not present

## 2019-01-06 DIAGNOSIS — I69391 Dysphagia following cerebral infarction: Secondary | ICD-10-CM | POA: Diagnosis not present

## 2019-01-06 DIAGNOSIS — I69354 Hemiplegia and hemiparesis following cerebral infarction affecting left non-dominant side: Secondary | ICD-10-CM | POA: Diagnosis not present

## 2019-01-06 DIAGNOSIS — M6281 Muscle weakness (generalized): Secondary | ICD-10-CM | POA: Diagnosis not present

## 2019-01-06 DIAGNOSIS — R262 Difficulty in walking, not elsewhere classified: Secondary | ICD-10-CM | POA: Diagnosis not present

## 2019-01-06 DIAGNOSIS — I69328 Other speech and language deficits following cerebral infarction: Secondary | ICD-10-CM | POA: Diagnosis not present

## 2019-01-07 DIAGNOSIS — R262 Difficulty in walking, not elsewhere classified: Secondary | ICD-10-CM | POA: Diagnosis not present

## 2019-01-07 DIAGNOSIS — M6281 Muscle weakness (generalized): Secondary | ICD-10-CM | POA: Diagnosis not present

## 2019-01-07 DIAGNOSIS — F250 Schizoaffective disorder, bipolar type: Secondary | ICD-10-CM | POA: Diagnosis not present

## 2019-01-07 DIAGNOSIS — I69391 Dysphagia following cerebral infarction: Secondary | ICD-10-CM | POA: Diagnosis not present

## 2019-01-07 DIAGNOSIS — I69354 Hemiplegia and hemiparesis following cerebral infarction affecting left non-dominant side: Secondary | ICD-10-CM | POA: Diagnosis not present

## 2019-01-07 DIAGNOSIS — I69328 Other speech and language deficits following cerebral infarction: Secondary | ICD-10-CM | POA: Diagnosis not present

## 2019-01-08 DIAGNOSIS — R262 Difficulty in walking, not elsewhere classified: Secondary | ICD-10-CM | POA: Diagnosis not present

## 2019-01-08 DIAGNOSIS — M6281 Muscle weakness (generalized): Secondary | ICD-10-CM | POA: Diagnosis not present

## 2019-01-08 DIAGNOSIS — I69391 Dysphagia following cerebral infarction: Secondary | ICD-10-CM | POA: Diagnosis not present

## 2019-01-08 DIAGNOSIS — I69354 Hemiplegia and hemiparesis following cerebral infarction affecting left non-dominant side: Secondary | ICD-10-CM | POA: Diagnosis not present

## 2019-01-08 DIAGNOSIS — I69328 Other speech and language deficits following cerebral infarction: Secondary | ICD-10-CM | POA: Diagnosis not present

## 2019-01-09 DIAGNOSIS — M6281 Muscle weakness (generalized): Secondary | ICD-10-CM | POA: Diagnosis not present

## 2019-01-09 DIAGNOSIS — I69391 Dysphagia following cerebral infarction: Secondary | ICD-10-CM | POA: Diagnosis not present

## 2019-01-09 DIAGNOSIS — R262 Difficulty in walking, not elsewhere classified: Secondary | ICD-10-CM | POA: Diagnosis not present

## 2019-01-09 DIAGNOSIS — I69354 Hemiplegia and hemiparesis following cerebral infarction affecting left non-dominant side: Secondary | ICD-10-CM | POA: Diagnosis not present

## 2019-01-09 DIAGNOSIS — I69328 Other speech and language deficits following cerebral infarction: Secondary | ICD-10-CM | POA: Diagnosis not present

## 2019-01-15 DIAGNOSIS — I69354 Hemiplegia and hemiparesis following cerebral infarction affecting left non-dominant side: Secondary | ICD-10-CM | POA: Diagnosis not present

## 2019-01-15 DIAGNOSIS — I69328 Other speech and language deficits following cerebral infarction: Secondary | ICD-10-CM | POA: Diagnosis not present

## 2019-01-15 DIAGNOSIS — R262 Difficulty in walking, not elsewhere classified: Secondary | ICD-10-CM | POA: Diagnosis not present

## 2019-01-15 DIAGNOSIS — I69391 Dysphagia following cerebral infarction: Secondary | ICD-10-CM | POA: Diagnosis not present

## 2019-01-15 DIAGNOSIS — M6281 Muscle weakness (generalized): Secondary | ICD-10-CM | POA: Diagnosis not present

## 2019-01-15 DIAGNOSIS — R1312 Dysphagia, oropharyngeal phase: Secondary | ICD-10-CM | POA: Diagnosis not present

## 2019-01-15 DIAGNOSIS — R41841 Cognitive communication deficit: Secondary | ICD-10-CM | POA: Diagnosis not present

## 2019-01-17 DIAGNOSIS — R262 Difficulty in walking, not elsewhere classified: Secondary | ICD-10-CM | POA: Diagnosis not present

## 2019-01-17 DIAGNOSIS — I69354 Hemiplegia and hemiparesis following cerebral infarction affecting left non-dominant side: Secondary | ICD-10-CM | POA: Diagnosis not present

## 2019-01-17 DIAGNOSIS — M6281 Muscle weakness (generalized): Secondary | ICD-10-CM | POA: Diagnosis not present

## 2019-01-17 DIAGNOSIS — R1312 Dysphagia, oropharyngeal phase: Secondary | ICD-10-CM | POA: Diagnosis not present

## 2019-01-17 DIAGNOSIS — I69328 Other speech and language deficits following cerebral infarction: Secondary | ICD-10-CM | POA: Diagnosis not present

## 2019-01-17 DIAGNOSIS — I69391 Dysphagia following cerebral infarction: Secondary | ICD-10-CM | POA: Diagnosis not present

## 2019-01-20 DIAGNOSIS — I69328 Other speech and language deficits following cerebral infarction: Secondary | ICD-10-CM | POA: Diagnosis not present

## 2019-01-20 DIAGNOSIS — I69354 Hemiplegia and hemiparesis following cerebral infarction affecting left non-dominant side: Secondary | ICD-10-CM | POA: Diagnosis not present

## 2019-01-20 DIAGNOSIS — M6281 Muscle weakness (generalized): Secondary | ICD-10-CM | POA: Diagnosis not present

## 2019-01-20 DIAGNOSIS — I69391 Dysphagia following cerebral infarction: Secondary | ICD-10-CM | POA: Diagnosis not present

## 2019-01-20 DIAGNOSIS — R262 Difficulty in walking, not elsewhere classified: Secondary | ICD-10-CM | POA: Diagnosis not present

## 2019-01-20 DIAGNOSIS — R1312 Dysphagia, oropharyngeal phase: Secondary | ICD-10-CM | POA: Diagnosis not present

## 2019-01-23 DIAGNOSIS — R1312 Dysphagia, oropharyngeal phase: Secondary | ICD-10-CM | POA: Diagnosis not present

## 2019-01-23 DIAGNOSIS — R262 Difficulty in walking, not elsewhere classified: Secondary | ICD-10-CM | POA: Diagnosis not present

## 2019-01-23 DIAGNOSIS — I69354 Hemiplegia and hemiparesis following cerebral infarction affecting left non-dominant side: Secondary | ICD-10-CM | POA: Diagnosis not present

## 2019-01-23 DIAGNOSIS — M6281 Muscle weakness (generalized): Secondary | ICD-10-CM | POA: Diagnosis not present

## 2019-01-23 DIAGNOSIS — I69328 Other speech and language deficits following cerebral infarction: Secondary | ICD-10-CM | POA: Diagnosis not present

## 2019-01-23 DIAGNOSIS — I69391 Dysphagia following cerebral infarction: Secondary | ICD-10-CM | POA: Diagnosis not present

## 2019-01-24 DIAGNOSIS — R262 Difficulty in walking, not elsewhere classified: Secondary | ICD-10-CM | POA: Diagnosis not present

## 2019-01-24 DIAGNOSIS — M6281 Muscle weakness (generalized): Secondary | ICD-10-CM | POA: Diagnosis not present

## 2019-01-24 DIAGNOSIS — I69354 Hemiplegia and hemiparesis following cerebral infarction affecting left non-dominant side: Secondary | ICD-10-CM | POA: Diagnosis not present

## 2019-01-24 DIAGNOSIS — R1312 Dysphagia, oropharyngeal phase: Secondary | ICD-10-CM | POA: Diagnosis not present

## 2019-01-24 DIAGNOSIS — I69328 Other speech and language deficits following cerebral infarction: Secondary | ICD-10-CM | POA: Diagnosis not present

## 2019-01-24 DIAGNOSIS — I69391 Dysphagia following cerebral infarction: Secondary | ICD-10-CM | POA: Diagnosis not present

## 2019-01-27 DIAGNOSIS — I69391 Dysphagia following cerebral infarction: Secondary | ICD-10-CM | POA: Diagnosis not present

## 2019-01-27 DIAGNOSIS — I69354 Hemiplegia and hemiparesis following cerebral infarction affecting left non-dominant side: Secondary | ICD-10-CM | POA: Diagnosis not present

## 2019-01-27 DIAGNOSIS — M6281 Muscle weakness (generalized): Secondary | ICD-10-CM | POA: Diagnosis not present

## 2019-01-27 DIAGNOSIS — R1312 Dysphagia, oropharyngeal phase: Secondary | ICD-10-CM | POA: Diagnosis not present

## 2019-01-27 DIAGNOSIS — R262 Difficulty in walking, not elsewhere classified: Secondary | ICD-10-CM | POA: Diagnosis not present

## 2019-01-27 DIAGNOSIS — I69328 Other speech and language deficits following cerebral infarction: Secondary | ICD-10-CM | POA: Diagnosis not present

## 2019-01-29 DIAGNOSIS — M6281 Muscle weakness (generalized): Secondary | ICD-10-CM | POA: Diagnosis not present

## 2019-01-29 DIAGNOSIS — I69354 Hemiplegia and hemiparesis following cerebral infarction affecting left non-dominant side: Secondary | ICD-10-CM | POA: Diagnosis not present

## 2019-01-29 DIAGNOSIS — R262 Difficulty in walking, not elsewhere classified: Secondary | ICD-10-CM | POA: Diagnosis not present

## 2019-01-29 DIAGNOSIS — R1312 Dysphagia, oropharyngeal phase: Secondary | ICD-10-CM | POA: Diagnosis not present

## 2019-01-29 DIAGNOSIS — I69328 Other speech and language deficits following cerebral infarction: Secondary | ICD-10-CM | POA: Diagnosis not present

## 2019-01-29 DIAGNOSIS — I69391 Dysphagia following cerebral infarction: Secondary | ICD-10-CM | POA: Diagnosis not present

## 2019-01-31 DIAGNOSIS — I69391 Dysphagia following cerebral infarction: Secondary | ICD-10-CM | POA: Diagnosis not present

## 2019-01-31 DIAGNOSIS — I69328 Other speech and language deficits following cerebral infarction: Secondary | ICD-10-CM | POA: Diagnosis not present

## 2019-01-31 DIAGNOSIS — R1312 Dysphagia, oropharyngeal phase: Secondary | ICD-10-CM | POA: Diagnosis not present

## 2019-01-31 DIAGNOSIS — M6281 Muscle weakness (generalized): Secondary | ICD-10-CM | POA: Diagnosis not present

## 2019-01-31 DIAGNOSIS — R262 Difficulty in walking, not elsewhere classified: Secondary | ICD-10-CM | POA: Diagnosis not present

## 2019-01-31 DIAGNOSIS — I69354 Hemiplegia and hemiparesis following cerebral infarction affecting left non-dominant side: Secondary | ICD-10-CM | POA: Diagnosis not present

## 2019-02-03 DIAGNOSIS — H409 Unspecified glaucoma: Secondary | ICD-10-CM | POA: Diagnosis not present

## 2019-02-03 DIAGNOSIS — I10 Essential (primary) hypertension: Secondary | ICD-10-CM | POA: Diagnosis not present

## 2019-02-04 DIAGNOSIS — F250 Schizoaffective disorder, bipolar type: Secondary | ICD-10-CM | POA: Diagnosis not present

## 2019-02-13 DIAGNOSIS — F250 Schizoaffective disorder, bipolar type: Secondary | ICD-10-CM | POA: Diagnosis not present

## 2019-02-14 DIAGNOSIS — Z20828 Contact with and (suspected) exposure to other viral communicable diseases: Secondary | ICD-10-CM | POA: Diagnosis not present

## 2019-02-20 DIAGNOSIS — R630 Anorexia: Secondary | ICD-10-CM | POA: Diagnosis not present

## 2019-02-20 DIAGNOSIS — I10 Essential (primary) hypertension: Secondary | ICD-10-CM | POA: Diagnosis not present

## 2019-02-20 DIAGNOSIS — F319 Bipolar disorder, unspecified: Secondary | ICD-10-CM | POA: Diagnosis not present

## 2019-03-11 DIAGNOSIS — Z23 Encounter for immunization: Secondary | ICD-10-CM | POA: Diagnosis not present

## 2019-03-14 DIAGNOSIS — Z20828 Contact with and (suspected) exposure to other viral communicable diseases: Secondary | ICD-10-CM | POA: Diagnosis not present

## 2019-03-21 DIAGNOSIS — Z20828 Contact with and (suspected) exposure to other viral communicable diseases: Secondary | ICD-10-CM | POA: Diagnosis not present

## 2019-03-28 DIAGNOSIS — Z20828 Contact with and (suspected) exposure to other viral communicable diseases: Secondary | ICD-10-CM | POA: Diagnosis not present

## 2019-04-01 DIAGNOSIS — F250 Schizoaffective disorder, bipolar type: Secondary | ICD-10-CM | POA: Diagnosis not present

## 2019-04-03 DIAGNOSIS — I10 Essential (primary) hypertension: Secondary | ICD-10-CM | POA: Diagnosis not present

## 2019-04-03 DIAGNOSIS — M549 Dorsalgia, unspecified: Secondary | ICD-10-CM | POA: Diagnosis not present

## 2019-04-03 DIAGNOSIS — F319 Bipolar disorder, unspecified: Secondary | ICD-10-CM | POA: Diagnosis not present

## 2019-04-04 DIAGNOSIS — Z20828 Contact with and (suspected) exposure to other viral communicable diseases: Secondary | ICD-10-CM | POA: Diagnosis not present

## 2019-04-08 DIAGNOSIS — I10 Essential (primary) hypertension: Secondary | ICD-10-CM | POA: Diagnosis not present

## 2019-04-08 DIAGNOSIS — Z23 Encounter for immunization: Secondary | ICD-10-CM | POA: Diagnosis not present

## 2019-04-08 DIAGNOSIS — H409 Unspecified glaucoma: Secondary | ICD-10-CM | POA: Diagnosis not present

## 2019-04-11 DIAGNOSIS — Z20828 Contact with and (suspected) exposure to other viral communicable diseases: Secondary | ICD-10-CM | POA: Diagnosis not present

## 2019-04-18 DIAGNOSIS — Z20828 Contact with and (suspected) exposure to other viral communicable diseases: Secondary | ICD-10-CM | POA: Diagnosis not present

## 2019-04-29 DIAGNOSIS — F250 Schizoaffective disorder, bipolar type: Secondary | ICD-10-CM | POA: Diagnosis not present

## 2019-05-06 DIAGNOSIS — I10 Essential (primary) hypertension: Secondary | ICD-10-CM | POA: Diagnosis not present

## 2019-05-06 DIAGNOSIS — H409 Unspecified glaucoma: Secondary | ICD-10-CM | POA: Diagnosis not present

## 2019-05-08 DIAGNOSIS — N390 Urinary tract infection, site not specified: Secondary | ICD-10-CM | POA: Diagnosis not present

## 2019-05-08 DIAGNOSIS — I10 Essential (primary) hypertension: Secondary | ICD-10-CM | POA: Diagnosis not present

## 2019-05-08 DIAGNOSIS — M6281 Muscle weakness (generalized): Secondary | ICD-10-CM | POA: Diagnosis not present

## 2019-05-08 DIAGNOSIS — R630 Anorexia: Secondary | ICD-10-CM | POA: Diagnosis not present

## 2019-05-22 DIAGNOSIS — F319 Bipolar disorder, unspecified: Secondary | ICD-10-CM | POA: Diagnosis not present

## 2019-05-24 DIAGNOSIS — E039 Hypothyroidism, unspecified: Secondary | ICD-10-CM | POA: Diagnosis not present

## 2019-05-24 DIAGNOSIS — I10 Essential (primary) hypertension: Secondary | ICD-10-CM | POA: Diagnosis not present

## 2019-06-04 DIAGNOSIS — I10 Essential (primary) hypertension: Secondary | ICD-10-CM | POA: Diagnosis not present

## 2019-06-04 DIAGNOSIS — H409 Unspecified glaucoma: Secondary | ICD-10-CM | POA: Diagnosis not present

## 2019-06-04 DIAGNOSIS — E039 Hypothyroidism, unspecified: Secondary | ICD-10-CM | POA: Diagnosis not present

## 2019-06-11 DIAGNOSIS — E039 Hypothyroidism, unspecified: Secondary | ICD-10-CM | POA: Diagnosis not present

## 2019-06-11 DIAGNOSIS — F319 Bipolar disorder, unspecified: Secondary | ICD-10-CM | POA: Diagnosis not present

## 2019-06-11 DIAGNOSIS — R630 Anorexia: Secondary | ICD-10-CM | POA: Diagnosis not present

## 2019-06-11 DIAGNOSIS — I10 Essential (primary) hypertension: Secondary | ICD-10-CM | POA: Diagnosis not present

## 2019-06-19 DIAGNOSIS — F319 Bipolar disorder, unspecified: Secondary | ICD-10-CM | POA: Diagnosis not present

## 2019-06-19 DIAGNOSIS — F0391 Unspecified dementia with behavioral disturbance: Secondary | ICD-10-CM | POA: Diagnosis not present

## 2019-06-19 DIAGNOSIS — F250 Schizoaffective disorder, bipolar type: Secondary | ICD-10-CM | POA: Diagnosis not present

## 2019-07-08 DIAGNOSIS — E039 Hypothyroidism, unspecified: Secondary | ICD-10-CM | POA: Diagnosis not present

## 2019-07-08 DIAGNOSIS — H409 Unspecified glaucoma: Secondary | ICD-10-CM | POA: Diagnosis not present

## 2019-07-08 DIAGNOSIS — I10 Essential (primary) hypertension: Secondary | ICD-10-CM | POA: Diagnosis not present

## 2019-07-11 DIAGNOSIS — I10 Essential (primary) hypertension: Secondary | ICD-10-CM | POA: Diagnosis not present

## 2019-07-11 DIAGNOSIS — F319 Bipolar disorder, unspecified: Secondary | ICD-10-CM | POA: Diagnosis not present

## 2019-07-11 DIAGNOSIS — R630 Anorexia: Secondary | ICD-10-CM | POA: Diagnosis not present

## 2019-07-16 DIAGNOSIS — N390 Urinary tract infection, site not specified: Secondary | ICD-10-CM | POA: Diagnosis not present

## 2019-07-16 DIAGNOSIS — R319 Hematuria, unspecified: Secondary | ICD-10-CM | POA: Diagnosis not present

## 2019-07-16 DIAGNOSIS — F319 Bipolar disorder, unspecified: Secondary | ICD-10-CM | POA: Diagnosis not present

## 2019-07-16 DIAGNOSIS — I10 Essential (primary) hypertension: Secondary | ICD-10-CM | POA: Diagnosis not present

## 2019-07-21 DIAGNOSIS — F319 Bipolar disorder, unspecified: Secondary | ICD-10-CM | POA: Diagnosis not present

## 2019-07-21 DIAGNOSIS — R569 Unspecified convulsions: Secondary | ICD-10-CM | POA: Diagnosis not present

## 2019-07-23 DIAGNOSIS — Z79899 Other long term (current) drug therapy: Secondary | ICD-10-CM | POA: Diagnosis not present

## 2019-07-23 DIAGNOSIS — N390 Urinary tract infection, site not specified: Secondary | ICD-10-CM | POA: Diagnosis not present

## 2019-07-23 DIAGNOSIS — M6281 Muscle weakness (generalized): Secondary | ICD-10-CM | POA: Diagnosis not present

## 2019-07-23 DIAGNOSIS — I69328 Other speech and language deficits following cerebral infarction: Secondary | ICD-10-CM | POA: Diagnosis not present

## 2019-07-23 DIAGNOSIS — R262 Difficulty in walking, not elsewhere classified: Secondary | ICD-10-CM | POA: Diagnosis not present

## 2019-07-23 DIAGNOSIS — I69391 Dysphagia following cerebral infarction: Secondary | ICD-10-CM | POA: Diagnosis not present

## 2019-07-23 DIAGNOSIS — R319 Hematuria, unspecified: Secondary | ICD-10-CM | POA: Diagnosis not present

## 2019-07-23 DIAGNOSIS — I69354 Hemiplegia and hemiparesis following cerebral infarction affecting left non-dominant side: Secondary | ICD-10-CM | POA: Diagnosis not present

## 2019-07-24 DIAGNOSIS — F0391 Unspecified dementia with behavioral disturbance: Secondary | ICD-10-CM | POA: Diagnosis not present

## 2019-07-24 DIAGNOSIS — F250 Schizoaffective disorder, bipolar type: Secondary | ICD-10-CM | POA: Diagnosis not present

## 2019-07-24 DIAGNOSIS — F319 Bipolar disorder, unspecified: Secondary | ICD-10-CM | POA: Diagnosis not present

## 2019-07-25 DIAGNOSIS — N179 Acute kidney failure, unspecified: Secondary | ICD-10-CM | POA: Diagnosis not present

## 2019-07-25 DIAGNOSIS — R29810 Facial weakness: Secondary | ICD-10-CM | POA: Diagnosis not present

## 2019-07-25 DIAGNOSIS — R4182 Altered mental status, unspecified: Secondary | ICD-10-CM | POA: Diagnosis not present

## 2019-07-25 DIAGNOSIS — R410 Disorientation, unspecified: Secondary | ICD-10-CM | POA: Diagnosis not present

## 2019-07-25 DIAGNOSIS — E119 Type 2 diabetes mellitus without complications: Secondary | ICD-10-CM | POA: Diagnosis not present

## 2019-07-25 DIAGNOSIS — Z885 Allergy status to narcotic agent status: Secondary | ICD-10-CM | POA: Diagnosis not present

## 2019-07-25 DIAGNOSIS — I700 Atherosclerosis of aorta: Secondary | ICD-10-CM | POA: Diagnosis not present

## 2019-07-25 DIAGNOSIS — E870 Hyperosmolality and hypernatremia: Secondary | ICD-10-CM | POA: Diagnosis not present

## 2019-07-25 DIAGNOSIS — F319 Bipolar disorder, unspecified: Secondary | ICD-10-CM | POA: Diagnosis not present

## 2019-07-25 DIAGNOSIS — R29818 Other symptoms and signs involving the nervous system: Secondary | ICD-10-CM | POA: Diagnosis not present

## 2019-07-25 DIAGNOSIS — Z7982 Long term (current) use of aspirin: Secondary | ICD-10-CM | POA: Diagnosis not present

## 2019-07-25 DIAGNOSIS — Z7902 Long term (current) use of antithrombotics/antiplatelets: Secondary | ICD-10-CM | POA: Diagnosis not present

## 2019-07-25 DIAGNOSIS — R7989 Other specified abnormal findings of blood chemistry: Secondary | ICD-10-CM | POA: Diagnosis not present

## 2019-07-25 DIAGNOSIS — J181 Lobar pneumonia, unspecified organism: Secondary | ICD-10-CM | POA: Diagnosis not present

## 2019-07-25 DIAGNOSIS — Z20822 Contact with and (suspected) exposure to covid-19: Secondary | ICD-10-CM | POA: Diagnosis not present

## 2019-07-25 DIAGNOSIS — E860 Dehydration: Secondary | ICD-10-CM | POA: Diagnosis not present

## 2019-07-25 DIAGNOSIS — K5730 Diverticulosis of large intestine without perforation or abscess without bleeding: Secondary | ICD-10-CM | POA: Diagnosis not present

## 2019-07-25 DIAGNOSIS — F0390 Unspecified dementia without behavioral disturbance: Secondary | ICD-10-CM | POA: Diagnosis not present

## 2019-07-25 DIAGNOSIS — R4020 Unspecified coma: Secondary | ICD-10-CM | POA: Diagnosis not present

## 2019-07-25 DIAGNOSIS — G9340 Encephalopathy, unspecified: Secondary | ICD-10-CM | POA: Diagnosis not present

## 2019-07-25 DIAGNOSIS — J189 Pneumonia, unspecified organism: Secondary | ICD-10-CM | POA: Diagnosis not present

## 2019-07-25 DIAGNOSIS — R404 Transient alteration of awareness: Secondary | ICD-10-CM | POA: Diagnosis not present

## 2019-07-25 DIAGNOSIS — Z79899 Other long term (current) drug therapy: Secondary | ICD-10-CM | POA: Diagnosis not present

## 2019-07-25 DIAGNOSIS — R0902 Hypoxemia: Secondary | ICD-10-CM | POA: Diagnosis not present

## 2019-07-26 DIAGNOSIS — N179 Acute kidney failure, unspecified: Secondary | ICD-10-CM | POA: Diagnosis not present

## 2019-07-26 DIAGNOSIS — E870 Hyperosmolality and hypernatremia: Secondary | ICD-10-CM | POA: Diagnosis not present

## 2019-07-26 DIAGNOSIS — J189 Pneumonia, unspecified organism: Secondary | ICD-10-CM | POA: Diagnosis not present

## 2019-07-26 DIAGNOSIS — E872 Acidosis: Secondary | ICD-10-CM | POA: Diagnosis not present

## 2019-07-26 DIAGNOSIS — Z4682 Encounter for fitting and adjustment of non-vascular catheter: Secondary | ICD-10-CM | POA: Diagnosis not present

## 2019-07-26 DIAGNOSIS — R918 Other nonspecific abnormal finding of lung field: Secondary | ICD-10-CM | POA: Diagnosis not present

## 2019-07-26 DIAGNOSIS — R0603 Acute respiratory distress: Secondary | ICD-10-CM | POA: Diagnosis not present

## 2019-07-26 DIAGNOSIS — Z9889 Other specified postprocedural states: Secondary | ICD-10-CM | POA: Diagnosis not present

## 2019-07-26 DIAGNOSIS — Z9911 Dependence on respirator [ventilator] status: Secondary | ICD-10-CM | POA: Diagnosis not present

## 2019-07-26 DIAGNOSIS — J9600 Acute respiratory failure, unspecified whether with hypoxia or hypercapnia: Secondary | ICD-10-CM | POA: Diagnosis not present

## 2019-07-26 DIAGNOSIS — Z452 Encounter for adjustment and management of vascular access device: Secondary | ICD-10-CM | POA: Diagnosis not present

## 2019-07-26 DIAGNOSIS — N390 Urinary tract infection, site not specified: Secondary | ICD-10-CM | POA: Diagnosis not present

## 2019-07-27 DIAGNOSIS — N390 Urinary tract infection, site not specified: Secondary | ICD-10-CM | POA: Diagnosis not present

## 2019-07-27 DIAGNOSIS — E870 Hyperosmolality and hypernatremia: Secondary | ICD-10-CM | POA: Diagnosis not present

## 2019-07-27 DIAGNOSIS — N179 Acute kidney failure, unspecified: Secondary | ICD-10-CM | POA: Diagnosis not present

## 2019-07-27 DIAGNOSIS — J9600 Acute respiratory failure, unspecified whether with hypoxia or hypercapnia: Secondary | ICD-10-CM | POA: Diagnosis not present

## 2019-07-27 DIAGNOSIS — E872 Acidosis: Secondary | ICD-10-CM | POA: Diagnosis not present

## 2019-07-27 DIAGNOSIS — G9341 Metabolic encephalopathy: Secondary | ICD-10-CM | POA: Diagnosis not present

## 2019-07-28 DIAGNOSIS — J9601 Acute respiratory failure with hypoxia: Secondary | ICD-10-CM | POA: Diagnosis not present

## 2019-07-28 DIAGNOSIS — E870 Hyperosmolality and hypernatremia: Secondary | ICD-10-CM | POA: Diagnosis not present

## 2019-07-28 DIAGNOSIS — N390 Urinary tract infection, site not specified: Secondary | ICD-10-CM | POA: Diagnosis not present

## 2019-07-28 DIAGNOSIS — Z4682 Encounter for fitting and adjustment of non-vascular catheter: Secondary | ICD-10-CM | POA: Diagnosis not present

## 2019-07-28 DIAGNOSIS — N179 Acute kidney failure, unspecified: Secondary | ICD-10-CM | POA: Diagnosis not present

## 2019-07-28 DIAGNOSIS — J9602 Acute respiratory failure with hypercapnia: Secondary | ICD-10-CM | POA: Diagnosis not present

## 2019-07-28 DIAGNOSIS — Z9911 Dependence on respirator [ventilator] status: Secondary | ICD-10-CM | POA: Diagnosis not present

## 2019-07-28 DIAGNOSIS — G9340 Encephalopathy, unspecified: Secondary | ICD-10-CM | POA: Diagnosis not present

## 2019-07-28 DIAGNOSIS — J984 Other disorders of lung: Secondary | ICD-10-CM | POA: Diagnosis not present

## 2019-07-29 DIAGNOSIS — N179 Acute kidney failure, unspecified: Secondary | ICD-10-CM | POA: Diagnosis not present

## 2019-07-29 DIAGNOSIS — J9601 Acute respiratory failure with hypoxia: Secondary | ICD-10-CM | POA: Diagnosis not present

## 2019-07-29 DIAGNOSIS — E870 Hyperosmolality and hypernatremia: Secondary | ICD-10-CM | POA: Diagnosis not present

## 2019-07-29 DIAGNOSIS — N390 Urinary tract infection, site not specified: Secondary | ICD-10-CM | POA: Diagnosis not present

## 2019-07-29 DIAGNOSIS — G9340 Encephalopathy, unspecified: Secondary | ICD-10-CM | POA: Diagnosis not present

## 2019-07-29 DIAGNOSIS — J9602 Acute respiratory failure with hypercapnia: Secondary | ICD-10-CM | POA: Diagnosis not present

## 2019-07-30 DIAGNOSIS — N179 Acute kidney failure, unspecified: Secondary | ICD-10-CM | POA: Diagnosis not present

## 2019-07-30 DIAGNOSIS — E870 Hyperosmolality and hypernatremia: Secondary | ICD-10-CM | POA: Diagnosis not present

## 2019-07-30 DIAGNOSIS — Z794 Long term (current) use of insulin: Secondary | ICD-10-CM | POA: Diagnosis not present

## 2019-07-30 DIAGNOSIS — S0003XA Contusion of scalp, initial encounter: Secondary | ICD-10-CM | POA: Diagnosis not present

## 2019-07-30 DIAGNOSIS — N390 Urinary tract infection, site not specified: Secondary | ICD-10-CM | POA: Diagnosis not present

## 2019-07-30 DIAGNOSIS — J9601 Acute respiratory failure with hypoxia: Secondary | ICD-10-CM | POA: Diagnosis not present

## 2019-07-30 DIAGNOSIS — Z4682 Encounter for fitting and adjustment of non-vascular catheter: Secondary | ICD-10-CM | POA: Diagnosis not present

## 2019-07-30 DIAGNOSIS — G9340 Encephalopathy, unspecified: Secondary | ICD-10-CM | POA: Diagnosis not present

## 2019-07-30 DIAGNOSIS — J9602 Acute respiratory failure with hypercapnia: Secondary | ICD-10-CM | POA: Diagnosis not present

## 2019-07-30 DIAGNOSIS — I639 Cerebral infarction, unspecified: Secondary | ICD-10-CM | POA: Diagnosis not present

## 2019-07-30 DIAGNOSIS — I10 Essential (primary) hypertension: Secondary | ICD-10-CM | POA: Diagnosis not present

## 2019-07-30 DIAGNOSIS — I6381 Other cerebral infarction due to occlusion or stenosis of small artery: Secondary | ICD-10-CM | POA: Diagnosis not present

## 2019-07-30 DIAGNOSIS — Z452 Encounter for adjustment and management of vascular access device: Secondary | ICD-10-CM | POA: Diagnosis not present

## 2019-07-30 DIAGNOSIS — E119 Type 2 diabetes mellitus without complications: Secondary | ICD-10-CM | POA: Diagnosis not present

## 2019-07-31 DIAGNOSIS — N179 Acute kidney failure, unspecified: Secondary | ICD-10-CM | POA: Diagnosis not present

## 2019-07-31 DIAGNOSIS — J9602 Acute respiratory failure with hypercapnia: Secondary | ICD-10-CM | POA: Diagnosis not present

## 2019-07-31 DIAGNOSIS — I6389 Other cerebral infarction: Secondary | ICD-10-CM | POA: Diagnosis not present

## 2019-07-31 DIAGNOSIS — I361 Nonrheumatic tricuspid (valve) insufficiency: Secondary | ICD-10-CM | POA: Diagnosis not present

## 2019-07-31 DIAGNOSIS — E870 Hyperosmolality and hypernatremia: Secondary | ICD-10-CM | POA: Diagnosis not present

## 2019-07-31 DIAGNOSIS — N390 Urinary tract infection, site not specified: Secondary | ICD-10-CM | POA: Diagnosis not present

## 2019-07-31 DIAGNOSIS — J9601 Acute respiratory failure with hypoxia: Secondary | ICD-10-CM | POA: Diagnosis not present

## 2019-07-31 MED ORDER — INSULIN LISPRO 100 UNIT/ML ~~LOC~~ SOLN
0.00 | SUBCUTANEOUS | Status: DC
Start: 2019-07-31 — End: 2019-07-31

## 2019-07-31 MED ORDER — CHLORHEXIDINE GLUCONATE 0.12 % MT SOLN
5.00 | OROMUCOSAL | Status: DC
Start: 2019-07-31 — End: 2019-07-31

## 2019-07-31 MED ORDER — SODIUM CHLORIDE 0.9 % IV SOLN
3.00 | INTRAVENOUS | Status: DC
Start: ? — End: 2019-07-31

## 2019-07-31 MED ORDER — POLYETHYLENE GLYCOL 3350 17 GM/SCOOP PO POWD
17.00 | ORAL | Status: DC
Start: 2019-08-01 — End: 2019-07-31

## 2019-07-31 MED ORDER — ENOXAPARIN SODIUM 30 MG/0.3ML ~~LOC~~ SOLN
30.00 | SUBCUTANEOUS | Status: DC
Start: 2019-08-01 — End: 2019-07-31

## 2019-07-31 MED ORDER — ASPIRIN 81 MG PO CHEW
81.00 | CHEWABLE_TABLET | ORAL | Status: DC
Start: 2019-08-01 — End: 2019-07-31

## 2019-07-31 MED ORDER — DEXTROSE 50 % IV SOLN
12.50 | INTRAVENOUS | Status: DC
Start: ? — End: 2019-07-31

## 2019-07-31 MED ORDER — ATORVASTATIN CALCIUM 40 MG PO TABS
40.00 | ORAL_TABLET | ORAL | Status: DC
Start: 2019-08-01 — End: 2019-07-31

## 2019-07-31 MED ORDER — METHIMAZOLE 5 MG PO TABS
5.00 | ORAL_TABLET | ORAL | Status: DC
Start: 2019-08-01 — End: 2019-07-31

## 2019-07-31 MED ORDER — INSULIN NPH (HUMAN) (ISOPHANE) 100 UNIT/ML ~~LOC~~ SUSP
10.00 | SUBCUTANEOUS | Status: DC
Start: 2019-07-31 — End: 2019-07-31

## 2019-07-31 MED ORDER — VALPROIC ACID 250 MG/5ML PO SOLN
250.00 | ORAL | Status: DC
Start: 2019-07-31 — End: 2019-07-31

## 2019-07-31 MED ORDER — PROPOFOL 100 MG/10ML IV EMUL
0.00 | INTRAVENOUS | Status: DC
Start: ? — End: 2019-07-31

## 2019-07-31 MED ORDER — GENERIC EXTERNAL MEDICATION
1.00 | Status: DC
Start: 2019-08-01 — End: 2019-07-31

## 2019-08-01 DIAGNOSIS — G9340 Encephalopathy, unspecified: Secondary | ICD-10-CM | POA: Diagnosis not present

## 2019-08-01 DIAGNOSIS — N390 Urinary tract infection, site not specified: Secondary | ICD-10-CM | POA: Diagnosis not present

## 2019-08-01 DIAGNOSIS — J9602 Acute respiratory failure with hypercapnia: Secondary | ICD-10-CM | POA: Diagnosis not present

## 2019-08-01 DIAGNOSIS — J9601 Acute respiratory failure with hypoxia: Secondary | ICD-10-CM | POA: Diagnosis not present

## 2019-08-01 DIAGNOSIS — I6389 Other cerebral infarction: Secondary | ICD-10-CM | POA: Diagnosis not present

## 2019-08-01 DIAGNOSIS — N179 Acute kidney failure, unspecified: Secondary | ICD-10-CM | POA: Diagnosis not present

## 2019-08-02 DIAGNOSIS — N179 Acute kidney failure, unspecified: Secondary | ICD-10-CM | POA: Diagnosis not present

## 2019-08-02 DIAGNOSIS — J9601 Acute respiratory failure with hypoxia: Secondary | ICD-10-CM | POA: Diagnosis not present

## 2019-08-02 DIAGNOSIS — R569 Unspecified convulsions: Secondary | ICD-10-CM | POA: Diagnosis not present

## 2019-08-02 DIAGNOSIS — I6389 Other cerebral infarction: Secondary | ICD-10-CM | POA: Diagnosis not present

## 2019-08-02 DIAGNOSIS — N390 Urinary tract infection, site not specified: Secondary | ICD-10-CM | POA: Diagnosis not present

## 2019-08-02 DIAGNOSIS — G9340 Encephalopathy, unspecified: Secondary | ICD-10-CM | POA: Diagnosis not present

## 2019-08-02 DIAGNOSIS — J9602 Acute respiratory failure with hypercapnia: Secondary | ICD-10-CM | POA: Diagnosis not present

## 2019-08-03 DIAGNOSIS — N390 Urinary tract infection, site not specified: Secondary | ICD-10-CM | POA: Diagnosis not present

## 2019-08-03 DIAGNOSIS — I6389 Other cerebral infarction: Secondary | ICD-10-CM | POA: Diagnosis not present

## 2019-08-03 DIAGNOSIS — J9602 Acute respiratory failure with hypercapnia: Secondary | ICD-10-CM | POA: Diagnosis not present

## 2019-08-03 DIAGNOSIS — G9340 Encephalopathy, unspecified: Secondary | ICD-10-CM | POA: Diagnosis not present

## 2019-08-03 DIAGNOSIS — J9601 Acute respiratory failure with hypoxia: Secondary | ICD-10-CM | POA: Diagnosis not present

## 2019-08-03 DIAGNOSIS — N179 Acute kidney failure, unspecified: Secondary | ICD-10-CM | POA: Diagnosis not present

## 2019-08-04 DIAGNOSIS — J9601 Acute respiratory failure with hypoxia: Secondary | ICD-10-CM | POA: Diagnosis not present

## 2019-08-04 DIAGNOSIS — G9340 Encephalopathy, unspecified: Secondary | ICD-10-CM | POA: Diagnosis not present

## 2019-08-04 DIAGNOSIS — Z9911 Dependence on respirator [ventilator] status: Secondary | ICD-10-CM | POA: Diagnosis not present

## 2019-08-04 DIAGNOSIS — E43 Unspecified severe protein-calorie malnutrition: Secondary | ICD-10-CM | POA: Diagnosis not present

## 2019-08-05 DIAGNOSIS — Z4682 Encounter for fitting and adjustment of non-vascular catheter: Secondary | ICD-10-CM | POA: Diagnosis not present

## 2019-08-05 DIAGNOSIS — J9601 Acute respiratory failure with hypoxia: Secondary | ICD-10-CM | POA: Diagnosis not present

## 2019-08-05 DIAGNOSIS — E43 Unspecified severe protein-calorie malnutrition: Secondary | ICD-10-CM | POA: Diagnosis not present

## 2019-08-05 DIAGNOSIS — G9340 Encephalopathy, unspecified: Secondary | ICD-10-CM | POA: Diagnosis not present

## 2019-08-05 DIAGNOSIS — R918 Other nonspecific abnormal finding of lung field: Secondary | ICD-10-CM | POA: Diagnosis not present

## 2019-08-05 DIAGNOSIS — Z9911 Dependence on respirator [ventilator] status: Secondary | ICD-10-CM | POA: Diagnosis not present

## 2019-08-06 DIAGNOSIS — J9601 Acute respiratory failure with hypoxia: Secondary | ICD-10-CM | POA: Diagnosis not present

## 2019-08-06 DIAGNOSIS — E43 Unspecified severe protein-calorie malnutrition: Secondary | ICD-10-CM | POA: Diagnosis not present

## 2019-08-06 DIAGNOSIS — Z9911 Dependence on respirator [ventilator] status: Secondary | ICD-10-CM | POA: Diagnosis not present

## 2019-08-06 DIAGNOSIS — G9340 Encephalopathy, unspecified: Secondary | ICD-10-CM | POA: Diagnosis not present

## 2019-08-07 DIAGNOSIS — E43 Unspecified severe protein-calorie malnutrition: Secondary | ICD-10-CM | POA: Diagnosis not present

## 2019-08-07 DIAGNOSIS — G9340 Encephalopathy, unspecified: Secondary | ICD-10-CM | POA: Diagnosis not present

## 2019-08-07 DIAGNOSIS — Z9911 Dependence on respirator [ventilator] status: Secondary | ICD-10-CM | POA: Diagnosis not present

## 2019-08-07 DIAGNOSIS — J9601 Acute respiratory failure with hypoxia: Secondary | ICD-10-CM | POA: Diagnosis not present

## 2019-08-08 DIAGNOSIS — E43 Unspecified severe protein-calorie malnutrition: Secondary | ICD-10-CM | POA: Diagnosis not present

## 2019-08-08 DIAGNOSIS — J9601 Acute respiratory failure with hypoxia: Secondary | ICD-10-CM | POA: Diagnosis not present

## 2019-08-08 DIAGNOSIS — Z9911 Dependence on respirator [ventilator] status: Secondary | ICD-10-CM | POA: Diagnosis not present

## 2019-08-08 DIAGNOSIS — R001 Bradycardia, unspecified: Secondary | ICD-10-CM | POA: Diagnosis not present

## 2019-08-08 DIAGNOSIS — G9340 Encephalopathy, unspecified: Secondary | ICD-10-CM | POA: Diagnosis not present

## 2019-08-09 DIAGNOSIS — R001 Bradycardia, unspecified: Secondary | ICD-10-CM | POA: Diagnosis not present

## 2019-08-09 DIAGNOSIS — E43 Unspecified severe protein-calorie malnutrition: Secondary | ICD-10-CM | POA: Diagnosis not present

## 2019-08-09 DIAGNOSIS — J9601 Acute respiratory failure with hypoxia: Secondary | ICD-10-CM | POA: Diagnosis not present

## 2019-08-09 DIAGNOSIS — G9340 Encephalopathy, unspecified: Secondary | ICD-10-CM | POA: Diagnosis not present

## 2019-08-09 DIAGNOSIS — Z9911 Dependence on respirator [ventilator] status: Secondary | ICD-10-CM | POA: Diagnosis not present

## 2019-08-10 DIAGNOSIS — G9340 Encephalopathy, unspecified: Secondary | ICD-10-CM | POA: Diagnosis not present

## 2019-08-10 DIAGNOSIS — J9601 Acute respiratory failure with hypoxia: Secondary | ICD-10-CM | POA: Diagnosis not present

## 2019-08-10 DIAGNOSIS — Z9911 Dependence on respirator [ventilator] status: Secondary | ICD-10-CM | POA: Diagnosis not present

## 2019-08-10 DIAGNOSIS — E43 Unspecified severe protein-calorie malnutrition: Secondary | ICD-10-CM | POA: Diagnosis not present

## 2019-08-10 DIAGNOSIS — R001 Bradycardia, unspecified: Secondary | ICD-10-CM | POA: Diagnosis not present

## 2019-08-11 DIAGNOSIS — Z9911 Dependence on respirator [ventilator] status: Secondary | ICD-10-CM | POA: Diagnosis not present

## 2019-08-11 DIAGNOSIS — G9340 Encephalopathy, unspecified: Secondary | ICD-10-CM | POA: Diagnosis not present

## 2019-08-11 DIAGNOSIS — J9600 Acute respiratory failure, unspecified whether with hypoxia or hypercapnia: Secondary | ICD-10-CM | POA: Diagnosis not present

## 2019-08-11 DIAGNOSIS — I6389 Other cerebral infarction: Secondary | ICD-10-CM | POA: Diagnosis not present

## 2019-08-15 DIAGNOSIS — R001 Bradycardia, unspecified: Secondary | ICD-10-CM | POA: Diagnosis not present

## 2019-08-16 DIAGNOSIS — J9811 Atelectasis: Secondary | ICD-10-CM | POA: Diagnosis not present

## 2019-08-16 DIAGNOSIS — R4182 Altered mental status, unspecified: Secondary | ICD-10-CM | POA: Diagnosis not present

## 2019-08-19 DIAGNOSIS — R4182 Altered mental status, unspecified: Secondary | ICD-10-CM | POA: Diagnosis not present

## 2019-08-24 DIAGNOSIS — R571 Hypovolemic shock: Secondary | ICD-10-CM | POA: Diagnosis not present

## 2019-08-24 DIAGNOSIS — R918 Other nonspecific abnormal finding of lung field: Secondary | ICD-10-CM | POA: Diagnosis not present

## 2019-08-24 DIAGNOSIS — I499 Cardiac arrhythmia, unspecified: Secondary | ICD-10-CM | POA: Diagnosis not present

## 2019-08-26 DIAGNOSIS — R001 Bradycardia, unspecified: Secondary | ICD-10-CM | POA: Diagnosis not present

## 2019-09-02 DIAGNOSIS — G9340 Encephalopathy, unspecified: Secondary | ICD-10-CM | POA: Diagnosis not present

## 2019-09-02 DIAGNOSIS — R001 Bradycardia, unspecified: Secondary | ICD-10-CM | POA: Diagnosis not present

## 2019-09-02 DIAGNOSIS — F0390 Unspecified dementia without behavioral disturbance: Secondary | ICD-10-CM | POA: Diagnosis not present

## 2019-09-02 DIAGNOSIS — D649 Anemia, unspecified: Secondary | ICD-10-CM | POA: Diagnosis not present

## 2019-09-02 DIAGNOSIS — R4189 Other symptoms and signs involving cognitive functions and awareness: Secondary | ICD-10-CM | POA: Diagnosis not present

## 2019-09-02 DIAGNOSIS — I639 Cerebral infarction, unspecified: Secondary | ICD-10-CM | POA: Diagnosis not present

## 2019-09-02 DIAGNOSIS — E0590 Thyrotoxicosis, unspecified without thyrotoxic crisis or storm: Secondary | ICD-10-CM | POA: Diagnosis not present

## 2019-09-04 DIAGNOSIS — Z978 Presence of other specified devices: Secondary | ICD-10-CM | POA: Diagnosis not present

## 2019-09-09 DIAGNOSIS — I10 Essential (primary) hypertension: Secondary | ICD-10-CM | POA: Diagnosis not present

## 2019-09-09 DIAGNOSIS — Z931 Gastrostomy status: Secondary | ICD-10-CM | POA: Diagnosis not present

## 2019-09-09 DIAGNOSIS — I69354 Hemiplegia and hemiparesis following cerebral infarction affecting left non-dominant side: Secondary | ICD-10-CM | POA: Diagnosis not present

## 2019-09-09 DIAGNOSIS — F319 Bipolar disorder, unspecified: Secondary | ICD-10-CM | POA: Diagnosis not present

## 2019-09-09 DIAGNOSIS — I69328 Other speech and language deficits following cerebral infarction: Secondary | ICD-10-CM | POA: Diagnosis not present

## 2019-09-09 DIAGNOSIS — J189 Pneumonia, unspecified organism: Secondary | ICD-10-CM | POA: Diagnosis not present

## 2019-09-09 DIAGNOSIS — F0391 Unspecified dementia with behavioral disturbance: Secondary | ICD-10-CM | POA: Diagnosis not present

## 2019-09-09 DIAGNOSIS — I639 Cerebral infarction, unspecified: Secondary | ICD-10-CM | POA: Diagnosis not present

## 2019-09-09 DIAGNOSIS — F0390 Unspecified dementia without behavioral disturbance: Secondary | ICD-10-CM | POA: Diagnosis not present

## 2019-09-09 DIAGNOSIS — I69391 Dysphagia following cerebral infarction: Secondary | ICD-10-CM | POA: Diagnosis not present

## 2019-09-09 DIAGNOSIS — N390 Urinary tract infection, site not specified: Secondary | ICD-10-CM | POA: Diagnosis not present

## 2019-09-09 DIAGNOSIS — H409 Unspecified glaucoma: Secondary | ICD-10-CM | POA: Diagnosis not present

## 2019-09-09 DIAGNOSIS — I69320 Aphasia following cerebral infarction: Secondary | ICD-10-CM | POA: Diagnosis not present

## 2019-09-09 DIAGNOSIS — E785 Hyperlipidemia, unspecified: Secondary | ICD-10-CM | POA: Diagnosis not present

## 2019-09-09 DIAGNOSIS — I6990 Unspecified sequelae of unspecified cerebrovascular disease: Secondary | ICD-10-CM | POA: Diagnosis not present

## 2019-09-09 DIAGNOSIS — E039 Hypothyroidism, unspecified: Secondary | ICD-10-CM | POA: Diagnosis not present

## 2019-09-09 DIAGNOSIS — E46 Unspecified protein-calorie malnutrition: Secondary | ICD-10-CM | POA: Diagnosis not present

## 2019-09-09 DIAGNOSIS — R400 Somnolence: Secondary | ICD-10-CM | POA: Diagnosis not present

## 2019-09-09 DIAGNOSIS — Z8673 Personal history of transient ischemic attack (TIA), and cerebral infarction without residual deficits: Secondary | ICD-10-CM | POA: Diagnosis not present

## 2019-09-09 DIAGNOSIS — F250 Schizoaffective disorder, bipolar type: Secondary | ICD-10-CM | POA: Diagnosis not present

## 2019-09-09 DIAGNOSIS — R262 Difficulty in walking, not elsewhere classified: Secondary | ICD-10-CM | POA: Diagnosis not present

## 2019-09-09 DIAGNOSIS — R630 Anorexia: Secondary | ICD-10-CM | POA: Diagnosis not present

## 2019-09-09 DIAGNOSIS — M6281 Muscle weakness (generalized): Secondary | ICD-10-CM | POA: Diagnosis not present

## 2019-09-09 DIAGNOSIS — I6930 Unspecified sequelae of cerebral infarction: Secondary | ICD-10-CM | POA: Diagnosis not present

## 2019-09-09 DIAGNOSIS — G9340 Encephalopathy, unspecified: Secondary | ICD-10-CM | POA: Diagnosis not present

## 2019-10-07 DIAGNOSIS — I10 Essential (primary) hypertension: Secondary | ICD-10-CM | POA: Diagnosis not present

## 2019-10-07 DIAGNOSIS — F0390 Unspecified dementia without behavioral disturbance: Secondary | ICD-10-CM | POA: Diagnosis not present

## 2019-10-07 DIAGNOSIS — E039 Hypothyroidism, unspecified: Secondary | ICD-10-CM | POA: Diagnosis not present

## 2019-10-07 DIAGNOSIS — I639 Cerebral infarction, unspecified: Secondary | ICD-10-CM | POA: Diagnosis not present

## 2019-10-07 DIAGNOSIS — H409 Unspecified glaucoma: Secondary | ICD-10-CM | POA: Diagnosis not present

## 2019-10-09 DIAGNOSIS — F319 Bipolar disorder, unspecified: Secondary | ICD-10-CM | POA: Diagnosis not present

## 2019-10-09 DIAGNOSIS — F250 Schizoaffective disorder, bipolar type: Secondary | ICD-10-CM | POA: Diagnosis not present

## 2019-10-09 DIAGNOSIS — F0391 Unspecified dementia with behavioral disturbance: Secondary | ICD-10-CM | POA: Diagnosis not present

## 2019-10-10 DIAGNOSIS — F0390 Unspecified dementia without behavioral disturbance: Secondary | ICD-10-CM | POA: Diagnosis not present

## 2019-10-10 DIAGNOSIS — I69320 Aphasia following cerebral infarction: Secondary | ICD-10-CM | POA: Diagnosis not present

## 2019-10-10 DIAGNOSIS — G9340 Encephalopathy, unspecified: Secondary | ICD-10-CM | POA: Diagnosis not present

## 2019-10-10 DIAGNOSIS — M6281 Muscle weakness (generalized): Secondary | ICD-10-CM | POA: Diagnosis not present

## 2019-10-10 DIAGNOSIS — I6930 Unspecified sequelae of cerebral infarction: Secondary | ICD-10-CM | POA: Diagnosis not present

## 2019-10-10 DIAGNOSIS — J189 Pneumonia, unspecified organism: Secondary | ICD-10-CM | POA: Diagnosis not present

## 2019-10-10 DIAGNOSIS — I69391 Dysphagia following cerebral infarction: Secondary | ICD-10-CM | POA: Diagnosis not present

## 2019-10-10 DIAGNOSIS — M6282 Rhabdomyolysis: Secondary | ICD-10-CM | POA: Diagnosis not present

## 2019-10-10 DIAGNOSIS — E43 Unspecified severe protein-calorie malnutrition: Secondary | ICD-10-CM | POA: Diagnosis not present

## 2019-10-13 DIAGNOSIS — I69320 Aphasia following cerebral infarction: Secondary | ICD-10-CM | POA: Diagnosis not present

## 2019-10-13 DIAGNOSIS — G9340 Encephalopathy, unspecified: Secondary | ICD-10-CM | POA: Diagnosis not present

## 2019-10-13 DIAGNOSIS — I69391 Dysphagia following cerebral infarction: Secondary | ICD-10-CM | POA: Diagnosis not present

## 2019-10-13 DIAGNOSIS — I6930 Unspecified sequelae of cerebral infarction: Secondary | ICD-10-CM | POA: Diagnosis not present

## 2019-10-13 DIAGNOSIS — M6281 Muscle weakness (generalized): Secondary | ICD-10-CM | POA: Diagnosis not present

## 2019-10-16 DIAGNOSIS — M6281 Muscle weakness (generalized): Secondary | ICD-10-CM | POA: Diagnosis not present

## 2019-10-16 DIAGNOSIS — I69391 Dysphagia following cerebral infarction: Secondary | ICD-10-CM | POA: Diagnosis not present

## 2019-10-16 DIAGNOSIS — I69320 Aphasia following cerebral infarction: Secondary | ICD-10-CM | POA: Diagnosis not present

## 2019-10-16 DIAGNOSIS — G9340 Encephalopathy, unspecified: Secondary | ICD-10-CM | POA: Diagnosis not present

## 2019-10-16 DIAGNOSIS — I6930 Unspecified sequelae of cerebral infarction: Secondary | ICD-10-CM | POA: Diagnosis not present

## 2019-10-31 DIAGNOSIS — N390 Urinary tract infection, site not specified: Secondary | ICD-10-CM | POA: Diagnosis not present

## 2019-10-31 DIAGNOSIS — R630 Anorexia: Secondary | ICD-10-CM | POA: Diagnosis not present

## 2019-11-04 DIAGNOSIS — I10 Essential (primary) hypertension: Secondary | ICD-10-CM | POA: Diagnosis not present

## 2019-11-04 DIAGNOSIS — I639 Cerebral infarction, unspecified: Secondary | ICD-10-CM | POA: Diagnosis not present

## 2019-11-04 DIAGNOSIS — H409 Unspecified glaucoma: Secondary | ICD-10-CM | POA: Diagnosis not present

## 2019-11-04 DIAGNOSIS — F0390 Unspecified dementia without behavioral disturbance: Secondary | ICD-10-CM | POA: Diagnosis not present

## 2019-11-04 DIAGNOSIS — E039 Hypothyroidism, unspecified: Secondary | ICD-10-CM | POA: Diagnosis not present

## 2019-11-06 DIAGNOSIS — G894 Chronic pain syndrome: Secondary | ICD-10-CM | POA: Diagnosis not present

## 2019-11-06 DIAGNOSIS — F319 Bipolar disorder, unspecified: Secondary | ICD-10-CM | POA: Diagnosis not present

## 2019-11-06 DIAGNOSIS — F250 Schizoaffective disorder, bipolar type: Secondary | ICD-10-CM | POA: Diagnosis not present

## 2019-11-06 DIAGNOSIS — F0391 Unspecified dementia with behavioral disturbance: Secondary | ICD-10-CM | POA: Diagnosis not present

## 2019-11-20 DIAGNOSIS — N390 Urinary tract infection, site not specified: Secondary | ICD-10-CM | POA: Diagnosis not present

## 2019-11-20 DIAGNOSIS — G9340 Encephalopathy, unspecified: Secondary | ICD-10-CM | POA: Diagnosis not present

## 2019-11-27 DIAGNOSIS — R630 Anorexia: Secondary | ICD-10-CM | POA: Diagnosis not present

## 2019-11-27 DIAGNOSIS — I10 Essential (primary) hypertension: Secondary | ICD-10-CM | POA: Diagnosis not present

## 2019-11-27 DIAGNOSIS — M6281 Muscle weakness (generalized): Secondary | ICD-10-CM | POA: Diagnosis not present

## 2019-11-28 DIAGNOSIS — I10 Essential (primary) hypertension: Secondary | ICD-10-CM | POA: Diagnosis not present

## 2019-11-28 DIAGNOSIS — R630 Anorexia: Secondary | ICD-10-CM | POA: Diagnosis not present

## 2019-11-28 DIAGNOSIS — M6281 Muscle weakness (generalized): Secondary | ICD-10-CM | POA: Diagnosis not present

## 2019-11-28 DIAGNOSIS — N390 Urinary tract infection, site not specified: Secondary | ICD-10-CM | POA: Diagnosis not present

## 2019-12-06 DIAGNOSIS — F0391 Unspecified dementia with behavioral disturbance: Secondary | ICD-10-CM | POA: Diagnosis not present

## 2019-12-06 DIAGNOSIS — F319 Bipolar disorder, unspecified: Secondary | ICD-10-CM | POA: Diagnosis not present

## 2019-12-06 DIAGNOSIS — F250 Schizoaffective disorder, bipolar type: Secondary | ICD-10-CM | POA: Diagnosis not present

## 2019-12-06 DIAGNOSIS — G894 Chronic pain syndrome: Secondary | ICD-10-CM | POA: Diagnosis not present

## 2019-12-22 DIAGNOSIS — I7091 Generalized atherosclerosis: Secondary | ICD-10-CM | POA: Diagnosis not present

## 2019-12-25 DIAGNOSIS — I10 Essential (primary) hypertension: Secondary | ICD-10-CM | POA: Diagnosis not present

## 2019-12-25 DIAGNOSIS — R7989 Other specified abnormal findings of blood chemistry: Secondary | ICD-10-CM | POA: Diagnosis not present

## 2019-12-27 DIAGNOSIS — G9340 Encephalopathy, unspecified: Secondary | ICD-10-CM | POA: Diagnosis not present

## 2019-12-27 DIAGNOSIS — I69320 Aphasia following cerebral infarction: Secondary | ICD-10-CM | POA: Diagnosis not present

## 2019-12-27 DIAGNOSIS — I6930 Unspecified sequelae of cerebral infarction: Secondary | ICD-10-CM | POA: Diagnosis not present

## 2019-12-27 DIAGNOSIS — I69391 Dysphagia following cerebral infarction: Secondary | ICD-10-CM | POA: Diagnosis not present

## 2019-12-27 DIAGNOSIS — M6281 Muscle weakness (generalized): Secondary | ICD-10-CM | POA: Diagnosis not present

## 2019-12-31 DIAGNOSIS — F319 Bipolar disorder, unspecified: Secondary | ICD-10-CM | POA: Diagnosis not present

## 2019-12-31 DIAGNOSIS — E46 Unspecified protein-calorie malnutrition: Secondary | ICD-10-CM | POA: Diagnosis not present

## 2020-01-16 DIAGNOSIS — Z23 Encounter for immunization: Secondary | ICD-10-CM | POA: Diagnosis not present

## 2020-02-12 DIAGNOSIS — E785 Hyperlipidemia, unspecified: Secondary | ICD-10-CM | POA: Diagnosis not present

## 2020-02-12 DIAGNOSIS — I10 Essential (primary) hypertension: Secondary | ICD-10-CM | POA: Diagnosis not present

## 2020-02-12 DIAGNOSIS — F319 Bipolar disorder, unspecified: Secondary | ICD-10-CM | POA: Diagnosis not present

## 2020-02-12 DIAGNOSIS — E46 Unspecified protein-calorie malnutrition: Secondary | ICD-10-CM | POA: Diagnosis not present

## 2020-02-12 DIAGNOSIS — F0390 Unspecified dementia without behavioral disturbance: Secondary | ICD-10-CM | POA: Diagnosis not present

## 2020-02-12 DIAGNOSIS — E0590 Thyrotoxicosis, unspecified without thyrotoxic crisis or storm: Secondary | ICD-10-CM | POA: Diagnosis not present

## 2020-02-12 DIAGNOSIS — G8929 Other chronic pain: Secondary | ICD-10-CM | POA: Diagnosis not present

## 2020-02-12 DIAGNOSIS — I639 Cerebral infarction, unspecified: Secondary | ICD-10-CM | POA: Diagnosis not present

## 2020-03-10 DIAGNOSIS — Z23 Encounter for immunization: Secondary | ICD-10-CM | POA: Diagnosis not present

## 2020-03-12 DIAGNOSIS — E785 Hyperlipidemia, unspecified: Secondary | ICD-10-CM | POA: Diagnosis not present

## 2020-03-12 DIAGNOSIS — I10 Essential (primary) hypertension: Secondary | ICD-10-CM | POA: Diagnosis not present

## 2020-11-30 ENCOUNTER — Encounter: Payer: Self-pay | Admitting: General Surgery

## 2020-11-30 ENCOUNTER — Other Ambulatory Visit: Payer: Self-pay

## 2020-11-30 ENCOUNTER — Ambulatory Visit (INDEPENDENT_AMBULATORY_CARE_PROVIDER_SITE_OTHER): Payer: Medicare Other | Admitting: General Surgery

## 2020-11-30 VITALS — BP 176/100 | HR 68 | Temp 98.6°F

## 2020-11-30 DIAGNOSIS — K9423 Gastrostomy malfunction: Secondary | ICD-10-CM

## 2020-11-30 NOTE — Progress Notes (Signed)
[name]; [id number]; [birth year]   HPI Patient is a [phone] WF nursing home patient who was referred to my care from Dr. Buelah Manis for evaluation of a gastrostomy tube.  She has had multiple episodes of pulling the gastrostomy tube out and having to go to the emergency room to replace it.  The originally was placed at another facility.  She was recently seen in the emergency room and they could only put a 10 [place] feeding tube in.  She is being referred for possible PEG replacement. Past Medical History:  Diagnosis Date   Adenomatous colon polyp    Bipolar 1 disorder ([place])    Diabetes mellitus    Diverticula of colon Colonoscopy 2013   Glaucoma    Hypertension    OSA (obstructive sleep apnea)    Schizophrenia ([place])    Stroke ([place])    2 TIA   Thyroid disease 2010   Urinary incontinence, mixed     Past Surgical History:  Procedure Laterality Date   ABDOMINAL HYSTERECTOMY     COLONOSCOPY  2007   pancolonic diverticulosis, tcs in 2012 due to h/o adenomatous polyps   COLONOSCOPY  11/15/2011   Procedure: COLONOSCOPY;  Surgeon: Daneil Dolin, MD;  [facility];  Service: Endoscopy;  Laterality: N/A;  7:30   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 (Patient taking differently: Take 325 mg by mouth daily.) 30 tablet 11   ATIVAN 0.5 MG tablet SMARTSIG:1 Tablet(s) Gastro Tube Every 12 Hours PRN     atorvastatin (LIPITOR) 40 MG tablet 1 tablet (40 mg total) by G-tube route daily.     bisacodyl (DULCOLAX) 10 MG suppository Place 1 suppository (10 mg total) rectally every three (3) days as needed for moderate constipation (if patient has not had BM x3 days). 12  suppository 0   gabapentin (NEURONTIN) 100 MG capsule 1 capsule (100 mg total) by G-tube route Two (2) times a day.     lamoTRIgine (LAMICTAL) 25 MG tablet 1 tablet (25 mg total) by G-tube route Two (2) times a day.     latanoprost (XALATAN) 0.005 % ophthalmic solution Administer 1 drop to both eyes nightly.     methimazole (TAPAZOLE) 5 MG tablet TAKE (1) TABLET BY MOUTH ONCE DAILY. 30 tablet 11   Nutritional Supplements (FEEDING SUPPLEMENT, JEVITY 1.2 CAL,) LIQD Place 1,000 mLs into feeding tube continuous.     OLANZapine (ZYPREXA) 5 MG tablet TAKE 1 TABLET BY MOUTH AT BEDTIME. 30 tablet 11   polyethylene glycol powder (GLYCOLAX/MIRALAX) powder Take 17 g by mouth 2 (two) times daily as needed. (Patient taking differently: Take 17 g by mouth 2 (two) times daily as needed for mild constipation.) 3350 g 1   sertraline (ZOLOFT) 50 MG tablet Take 50 mg by mouth daily.     timolol (TIMOPTIC) 0.5 % ophthalmic solution INSTILL 1 DROP INTO RIGHT EYE EVERY MORNING 10 mL 11   Calcium-Vitamin D 600-200 MG-UNIT tablet TAKE 1 TABLET BY MOUTH TWICE DAILY. (Patient not taking: Reported on 11/30/2020) 60 tablet  11   carvedilol (COREG) 6.25 MG tablet TAKE (1) TABLET BY MOUTH TWICE A DAY WITH MEALS. (Patient not taking: Reported on 11/30/2020) 60 tablet 11   cloNIDine (CATAPRES) 0.1 MG tablet TAKE 1 TABLET BY MOUTH IN THE EVENING. (Patient not taking: Reported on 11/30/2020) 30 tablet 11   clopidogrel (PLAVIX) 75 MG tablet TAKE (1) TABLET BY MOUTH ONCE DAILY. (Patient not taking: Reported on 11/30/2020) 30 tablet 11   FANAPT 4 MG TABS tablet TAKE 1 TABLET BY MOUTH TWICE DAILY. (Patient not taking: Reported on 11/30/2020) 60 tablet 11   furosemide (LASIX) 20 MG tablet TAKE (1) TABLET BY MOUTH ONCE DAILY. (Patient not taking: Reported on 11/30/2020) 30 tablet 11   Glucose Blood (BLOOD GLUCOSE TEST STRIPS) STRP Use to monitor FSBS 1x daily. Dx: E11.9. (Patient not taking: Reported on 11/30/2020) 50 each 11   hydrocortisone  (PROCTOCORT) 1 % CREA Apply to rectal area BID PRN for pain. Please dispense rectal tip. (Patient not taking: Reported on 11/30/2020) 1 Tube 3   loratadine (CLARITIN) 10 MG tablet Take 1 tablet (10 mg total) by mouth daily as needed for allergies. (Patient not taking: Reported on 11/30/2020) 30 tablet 6   perphenazine (TRILAFON) 2 MG tablet TAKE (1) TABLET BY MOUTH ONCE DAILY. (Patient not taking: Reported on 11/30/2020) 30 tablet 11   topiramate (TOPAMAX) 50 MG tablet Take 1 tablet (50 mg total) by mouth 3 (three) times daily. (Patient not taking: Reported on 11/30/2020) 90 tablet 5   traMADol (ULTRAM) 50 MG tablet Take 1 tablet (50 mg total) by mouth every 12 (twelve) hours as needed. (Patient not taking: Reported on 11/30/2020) 30 tablet 0   No current facility-administered medications on file prior to visit.    Allergies  Allergen Reactions   Codeine     Listed on MAR but there is no reaction listed    Social History   Substance and Sexual Activity  Alcohol Use No    Social History   Tobacco Use  Smoking Status Never  Smokeless Tobacco Never    Review of Systems  Unable to perform ROS: Patient nonverbal   Objective   Vitals:   11/30/20 1421  BP: (!) 176/100  Pulse: 68  Temp: 98.6 F (37 C)  SpO2: 96%    Physical Exam Vitals reviewed.  Constitutional:      Comments: Wheelchair-bound, noncommunicative.  HENT:     Head: Normocephalic and atraumatic.  Cardiovascular:     Rate and Rhythm: Normal rate and regular rhythm.     Heart sounds: Normal heart sounds. No murmur heard.   No gallop.  Pulmonary:     Effort: Pulmonary effort is normal. No respiratory distress.     Breath sounds: Normal breath sounds. No stridor. No wheezing, rhonchi or rales.  Abdominal:     General: There is no distension.     Palpations: Abdomen is soft. There is no mass.     Tenderness: There is no abdominal tenderness. There is no guarding or rebound.     Hernia: No hernia is present.      Comments: 74 French feeding tube in place in the left upper abdomen.  Skin:    General: Skin is warm and dry.   ER records reviewed Assessment  Malfunctioning gastrostomy tube with now a 10 [place] feeding tube in place temporarily. Plan  As it appears that the gastrostomy opening has narrowed, I will refer her to interventional radiology in [facility] for gastrostomy tube replacement.  I also  suggested to the nurse that the patient needs an abdominal binder and/or hand mitts to prevent further dislodgment of the tube.

## 2021-02-07 ENCOUNTER — Emergency Department (HOSPITAL_COMMUNITY): Payer: Medicare Other

## 2021-02-07 ENCOUNTER — Emergency Department (HOSPITAL_COMMUNITY)
Admission: EM | Admit: 2021-02-07 | Discharge: 2021-02-07 | Disposition: A | Discharge status: Home / Self Care | Payer: Medicare Other | Attending: Emergency Medicine | Admitting: Emergency Medicine

## 2021-02-07 DIAGNOSIS — Z79899 Other long term (current) drug therapy: Secondary | ICD-10-CM | POA: Insufficient documentation

## 2021-02-07 DIAGNOSIS — Z7982 Long term (current) use of aspirin: Secondary | ICD-10-CM | POA: Insufficient documentation

## 2021-02-07 DIAGNOSIS — Z931 Gastrostomy status: Secondary | ICD-10-CM

## 2021-02-07 DIAGNOSIS — I10 Essential (primary) hypertension: Secondary | ICD-10-CM | POA: Insufficient documentation

## 2021-02-07 DIAGNOSIS — E039 Hypothyroidism, unspecified: Secondary | ICD-10-CM | POA: Insufficient documentation

## 2021-02-07 DIAGNOSIS — K9423 Gastrostomy malfunction: Secondary | ICD-10-CM | POA: Diagnosis present

## 2021-02-07 DIAGNOSIS — E119 Type 2 diabetes mellitus without complications: Secondary | ICD-10-CM | POA: Diagnosis not present

## 2021-02-07 DIAGNOSIS — Z4659 Encounter for fitting and adjustment of other gastrointestinal appliance and device: Secondary | ICD-10-CM

## 2021-02-07 MED ORDER — DIATRIZOATE MEGLUMINE & SODIUM 66-10 % PO SOLN
ORAL | Status: AC
  Filled 2021-02-07: qty 30

## 2021-02-07 NOTE — ED Notes (Signed)
PTAR to be called for patient  

## 2021-02-07 NOTE — ED Triage Notes (Signed)
Pt BIB [facility] from [facility]. Per EMS facility called out saying that pt gtube was rescinded. On arrival to EMS gtube is present.  Pt is nonverbal at baseline.  VS with EMS  147/65 P 58 RR 14  O2 97% RA

## 2021-02-07 NOTE — ED Notes (Signed)
Report given to [facility] at [facility] - would like to be called with pt is leaving

## 2021-02-07 NOTE — ED Notes (Signed)
X-ray at bedside

## 2021-02-07 NOTE — ED Notes (Signed)
PTAR here to get patient

## 2021-02-07 NOTE — ED Notes (Signed)
Discharge instructions reviewed with facility - pt to return via [facility]

## 2021-02-07 NOTE — ED Notes (Signed)
MD Ralene Bathe at bedside - gtube present - gtube flushed by MD

## 2021-02-07 NOTE — ED Provider Notes (Signed)
[name] EM[facility] Provider Note   CSN: [id number] Arrival date & time:        History Chief Complaint  Patient presents with   gtube issue    EDRIE Parrish is a 75 y.o. female.  The history is provided by the EMS personnel, the nursing home and medical records.  [name] is a 75 y.o. female who presents to the Emergency Department complaining of g tube problem. Level V caveat due to mental disability. History is provided by EMS and the nursing facility. She presents to the emergency department by EMS from [facility] for issue with her G-tube. Per EMS it was in her belly more than it normally is. Contacted facility - nurse states that the G-tube appears to be further in that it normally is. Per report it occasionally does this. No reports of abdominal pain, vomiting, change in ability to tolerate tube feeds.    Past Medical History:  Diagnosis Date   Adenomatous colon polyp    Bipolar 1 disorder ([place])    Diabetes mellitus    Diverticula of colon Colonoscopy 2013   Glaucoma    Hypertension    OSA (obstructive sleep apnea)    Schizophrenia ([place])    Stroke ([place])    2 TIA   Thyroid disease 2010   Urinary incontinence, mixed     Patient Active Problem List   Diagnosis Date Noted   Sacral decubitus ulcer 01/31/2018   Protein-calorie malnutrition ([place]) 01/30/2018   Hypothyroidism 06/01/2017   Gait instability 06/11/2015   Bipolar I disorder ([place]) 01/19/2015   Chronic insomnia 01/19/2015   Seasonal allergies 06/19/2014   Loss of weight 10/01/2013   Subclinical hyperthyroidism 07/01/2013   Ingrown toenail without infection 06/25/2013   Neuropathy, peripheral 09/24/2012   Bladder incontinence 04/29/2012   Visual disturbance 05/03/2011   Hx of adenomatous colonic polyps 04/12/2011   Ingrown right big toenail 04/11/2011   Shoulder pain 04/11/2011   Hyperlipidemia 11/24/2010   Schizophrenia ([place]) 11/12/2010   Diabetes  mellitus ([place]) 11/10/2010   HTN (hypertension) 11/10/2010   Bilateral leg edema 11/10/2010    Past Surgical History:  Procedure Laterality Date   ABDOMINAL HYSTERECTOMY     COLONOSCOPY  2007   pancolonic diverticulosis, tcs in 2012 due to h/o adenomatous polyps   COLONOSCOPY  11/15/2011   Procedure: COLONOSCOPY;  Surgeon: Daneil Dolin, MD;  [facility];  Service: Endoscopy;  Laterality: N/A;  7:30   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     Family History  Problem Relation Age of Onset   Other Other        unknown   Thyroid disease Daughter        Partial thyroid removal/tumor     Social History   Tobacco Use   Smoking status: Never   Smokeless tobacco: Never  Vaping Use   Vaping Use: Never used  Substance Use Topics   Alcohol use: No   Drug use: No    Home Medications Prior to Admission medications   Medication Sig Start Date End Date Taking? Authorizing Provider  aspirin EC 325 MG tablet TAKE (1) TABLET BY MOUTH ONCE DAILY. Patient taking differently: Take 325 mg by mouth daily. 12/14/17   Alycia Rossetti, MD  ATIVAN 0.5 MG tablet SMARTSIG:1 Tablet(s) Luvenia Heller Every  12 Hours PRN 11/29/20   [provider]  atorvastatin (LIPITOR) 40 MG tablet 1 tablet (40 mg total) by G-tube route daily. 09/09/19   [provider]  bisacodyl (DULCOLAX) 10 MG suppository Place 1 suppository (10 mg total) rectally every three (3) days as needed for moderate constipation (if patient has not had BM x3 days). 09/05/17   Alycia Rossetti, MD  Calcium-Vitamin D 600-200 MG-UNIT tablet TAKE 1 TABLET BY MOUTH TWICE DAILY. Patient not taking: Reported on 11/30/2020 12/14/17   Alycia Rossetti, MD  carvedilol (COREG) 6.25 MG tablet TAKE (1) TABLET BY MOUTH TWICE A DAY WITH MEALS. Patient not taking: Reported on 11/30/2020 12/14/17   Alycia Rossetti, MD  cloNIDine (CATAPRES) 0.1 MG tablet  TAKE 1 TABLET BY MOUTH IN THE EVENING. Patient not taking: Reported on 11/30/2020 04/15/18   Alycia Rossetti, MD  clopidogrel (PLAVIX) 75 MG tablet TAKE (1) TABLET BY MOUTH ONCE DAILY. Patient not taking: Reported on 11/30/2020 12/14/17   Alycia Rossetti, MD  FANAPT 4 MG TABS tablet TAKE 1 TABLET BY MOUTH TWICE DAILY. Patient not taking: Reported on 11/30/2020 04/15/18   Alycia Rossetti, MD  furosemide (LASIX) 20 MG tablet TAKE (1) TABLET BY MOUTH ONCE DAILY. Patient not taking: Reported on 11/30/2020 12/14/17   Alycia Rossetti, MD  gabapentin (NEURONTIN) 100 MG capsule 1 capsule (100 mg total) by G-tube route Two (2) times a day. 09/09/19   [provider]  Glucose Blood (BLOOD GLUCOSE TEST STRIPS) STRP Use to monitor FSBS 1x daily. Dx: E11.9. Patient not taking: Reported on 11/30/2020 06/19/14   Alycia Rossetti, MD  hydrocortisone (PROCTOCORT) 1 % CREA Apply to rectal area BID PRN for pain. Please dispense rectal tip. Patient not taking: Reported on 11/30/2020 05/11/17   Alycia Rossetti, MD  lamoTRIgine (LAMICTAL) 25 MG tablet 1 tablet (25 mg total) by G-tube route Two (2) times a day. 09/09/19   [provider]  latanoprost (XALATAN) 0.005 % ophthalmic solution Administer 1 drop to both eyes nightly. 01/23/17   [provider]  loratadine (CLARITIN) 10 MG tablet Take 1 tablet (10 mg total) by mouth daily as needed for allergies. Patient not taking: Reported on 11/30/2020 05/11/17   Alycia Rossetti, MD  methimazole (TAPAZOLE) 5 MG tablet TAKE (1) TABLET BY MOUTH ONCE DAILY. 05/14/18   [place], Modena Nunnery, MD  Nutritional Supplements (FEEDING SUPPLEMENT, JEVITY 1.2 CAL,) LIQD Place 1,000 mLs into feeding tube continuous.    [provider]  OLANZapine (ZYPREXA) 5 MG tablet TAKE 1 TABLET BY MOUTH AT BEDTIME. 04/15/18   [place], Modena Nunnery, MD  perphenazine (TRILAFON) 2 MG tablet TAKE (1) TABLET BY MOUTH ONCE DAILY. Patient not taking: Reported on 11/30/2020 04/15/18   Alycia Rossetti, MD  polyethylene glycol powder [facility]) powder Take 17 g by mouth 2 (two) times daily as needed. Patient taking differently: Take 17 g by mouth 2 (two) times daily as needed for mild constipation. 09/04/17   Alycia Rossetti, MD  sertraline (ZOLOFT) 50 MG tablet Take 50 mg by mouth daily. 11/27/20   [provider]  timolol (TIMOPTIC) 0.5 % ophthalmic solution INSTILL 1 DROP INTO RIGHT EYE EVERY MORNING 05/20/18   [place], Modena Nunnery, MD  topiramate (TOPAMAX) 50 MG tablet Take 1 tablet (50 mg total) by mouth 3 (three) times daily. Patient not taking: Reported on 11/30/2020 08/08/18   Alycia Rossetti, MD  traMADol (ULTRAM) 50 MG tablet Take 1 tablet (  50 mg total) by mouth every 12 (twelve) hours as needed. Patient not taking: Reported on 11/30/2020 05/08/17   Alycia Rossetti, MD    Allergies    Codeine  Review of Systems   Review of Systems  All other systems reviewed and are negative.  Physical Exam Updated Vital Signs BP 130/72   Pulse (!) 52   Temp 98.4 F (36.9 C) (Axillary)   Resp 17   SpO2 99%   Physical Exam Vitals and nursing note reviewed.  Constitutional:      Appearance: She is well-developed.     Comments: Chronically ill appearing  HENT:     Head: Normocephalic and atraumatic.  Cardiovascular:     Rate and Rhythm: Normal rate and regular rhythm.     Heart sounds: No murmur heard. Pulmonary:     Effort: Pulmonary effort is normal. No respiratory distress.     Breath sounds: Normal breath sounds.  Abdominal:     Palpations: Abdomen is soft.     Tenderness: There is no abdominal tenderness. There is no guarding or rebound.     Comments: 14 fr gastrostomy tube in place without surrounding erythema, induration, drainage or tenderness  Musculoskeletal:        General: No tenderness.  Skin:    General: Skin is warm and dry.  Neurological:     Comments: Nonverbal. Contractures to all four extremities  Psychiatric:        Behavior:  Behavior normal.    ED Results / Procedures / Treatments   Labs (all labs ordered are listed, but only abnormal results are displayed) Labs Reviewed - No data to display  EKG None  Radiology DG ABDOMEN PEG TUBE LOCATION  Result Date: 02/07/2021 CLINICAL DATA:  Peg placement EXAM: ABDOMEN - 1 VIEW COMPARISON:  Radiography from earlier today FINDINGS: Percutaneous jejunostomy tube. Injection opacifies small bowel without visible extravasation. No concerning mass effect or calcification. IMPRESSION: Located percutaneous jejunostomy tube.  Negative for extravasation. Electronically Signed   By: Jorje Guild M.D.   On: 02/07/2021 04:05   DG ABDOMEN PEG TUBE LOCATION  Result Date: 02/07/2021 CLINICAL DATA:  PEG tube location. EXAM: ABDOMEN - 1 VIEW COMPARISON:  12/19/2020. FINDINGS: 30 mL Gastrografin were injected through the PEG tube. Examination is limited due to external material present over the upper abdomen. A PEG tube terminates in the upper abdomen just to the right of midline. The balloon is not clearly visualized. The bowel gas pattern is normal. IMPRESSION: Limited evaluation due to external material over the upper abdomen. The distal tip of a PEG tube terminates in the upper abdomen to the right of midline. The PEG tube balloon is not clearly visualized on exam. Repeat radiograph with external material removed is suggested. Electronically Signed   By: Brett Fairy M.D.   On: 02/07/2021 03:18    Procedures Procedures   Medications Ordered in ED Medications  diatrizoate meglumine-sodium (GASTROGRAFIN) 66-10 % solution (has no administration in time range)  diatrizoate meglumine-sodium (GASTROGRAFIN) 66-10 % solution (has no administration in time range)    ED Course  I have reviewed the triage vital signs and the nursing notes.  Pertinent labs & imaging results that were available during my care of the patient were reviewed by me and considered in my medical decision making  (see chart for details).    MDM Rules/Calculators/A&P  patient here for evaluation of possible gastrostomy tube malfunction. On evaluation there is a tube in place that is flushing without difficulty. Imaging demonstrates that he was improper position. Plan to discharge back to facility with outpatient follow-up.  Final Clinical Impression(s) / ED Diagnoses Final diagnoses:  Gastrostomy tube in place [facility])    Rx / DC Orders ED Discharge Orders     None        Quintella Reichert, MD 02/07/21 [phone]

## 2021-02-07 NOTE — Discharge Instructions (Signed)
[name] gastrostomy tube appears to be functioning appropriately. This tube was not manipulated during her ED stay aside from checking placement. She can follow-up with her family doctor as needed.

## 2021-06-19 ENCOUNTER — Other Ambulatory Visit: Payer: Self-pay

## 2021-06-19 ENCOUNTER — Emergency Department (HOSPITAL_COMMUNITY): Payer: Medicare Other

## 2021-06-19 ENCOUNTER — Encounter (HOSPITAL_COMMUNITY): Payer: Self-pay

## 2021-06-19 ENCOUNTER — Emergency Department (HOSPITAL_COMMUNITY)
Admission: EM | Admit: 2021-06-19 | Discharge: 2021-06-19 | Disposition: A | Discharge status: Skilled Nursing Facility | Payer: Medicare Other | Attending: Emergency Medicine | Admitting: Emergency Medicine

## 2021-06-19 DIAGNOSIS — K9420 Gastrostomy complication, unspecified: Secondary | ICD-10-CM

## 2021-06-19 DIAGNOSIS — K9423 Gastrostomy malfunction: Secondary | ICD-10-CM | POA: Diagnosis present

## 2021-06-19 DIAGNOSIS — Z7982 Long term (current) use of aspirin: Secondary | ICD-10-CM | POA: Insufficient documentation

## 2021-06-19 MED ORDER — DIATRIZOATE MEGLUMINE & SODIUM 66-10 % PO SOLN
ORAL | Status: AC
  Administered 2021-06-19: 50 mL
  Filled 2021-06-19: qty 60

## 2021-06-19 NOTE — ED Triage Notes (Signed)
Patient via EMS from [facility] in [place] due to G-Tube being pulled out and needing replaced.  ?

## 2021-06-19 NOTE — ED Notes (Signed)
[facility] [employer] called to set up transportation at this time. 

## 2021-06-19 NOTE — ED Notes (Addendum)
Delle Reining at [facility] was updated on the patients condition and the placement of an 21f Foley tube. ? ?The facility is aware the patient will return via EMS when a truck becomes available and that may take hours. ?

## 2021-06-19 NOTE — Discharge Instructions (Addendum)
Continue current treatments.  We replaced the gastrostomy tube with a Foley catheter, which can function as a feeding tube.  Contact your doctor to see about getting it replaced with a regular gastrostomy tube. ?

## 2021-06-19 NOTE — ED Provider Notes (Signed)
?[facility] ?Provider Note ? ? ?CSN: [id number] ?Arrival date & time: 06/19/21  3474 ? ?  ? ?History ? ?Chief Complaint  ?Patient presents with  ? G-Tube  ? ? ?[name] is a 76 y.o. female. ? ?HPI ?Patient presenting for treatment of dislodged gastric tube.  Tube came out is with the patient. ?  ? ?Home Medications ?Prior to Admission medications   ?Medication Sig Start Date End Date Taking? Authorizing Provider  ?aspirin EC 325 MG tablet TAKE (1) TABLET BY MOUTH ONCE DAILY. ?Patient taking differently: Take 325 mg by mouth daily. 12/14/17   Alycia Rossetti, MD  ?ATIVAN 0.5 MG tablet SMARTSIG:1 Tablet(s) Gastro Tube Every 12 Hours PRN 11/29/20   [provider]  ?atorvastatin (LIPITOR) 40 MG tablet 1 tablet (40 mg total) by G-tube route daily. 09/09/19   [provider]  ?bisacodyl (DULCOLAX) 10 MG suppository Place 1 suppository (10 mg total) rectally every three (3) days as needed for moderate constipation (if patient has not had BM x3 days). 09/05/17   Alycia Rossetti, MD  ?Calcium-Vitamin D 600-200 MG-UNIT tablet TAKE 1 TABLET BY MOUTH TWICE DAILY. ?Patient not taking: Reported on 11/30/2020 12/14/17   Alycia Rossetti, MD  ?carvedilol (COREG) 6.25 MG tablet TAKE (1) TABLET BY MOUTH TWICE A DAY WITH MEALS. ?Patient not taking: Reported on 11/30/2020 12/14/17   Alycia Rossetti, MD  ?cloNIDine (CATAPRES) 0.1 MG tablet TAKE 1 TABLET BY MOUTH IN THE EVENING. ?Patient not taking: Reported on 11/30/2020 04/15/18   Alycia Rossetti, MD  ?clopidogrel (PLAVIX) 75 MG tablet TAKE (1) TABLET BY MOUTH ONCE DAILY. ?Patient not taking: Reported on 11/30/2020 12/14/17   Alycia Rossetti, MD  ?FANAPT 4 MG TABS tablet TAKE 1 TABLET BY MOUTH TWICE DAILY. ?Patient not taking: Reported on 11/30/2020 04/15/18   Alycia Rossetti, MD  ?furosemide (LASIX) 20 MG tablet TAKE (1) TABLET BY MOUTH ONCE DAILY. ?Patient not taking: Reported on 11/30/2020 12/14/17   Alycia Rossetti, MD  ?gabapentin  (NEURONTIN) 100 MG capsule 1 capsule (100 mg total) by G-tube route Two (2) times a day. 09/09/19   [provider]  ?Glucose Blood (BLOOD GLUCOSE TEST STRIPS) STRP Use to monitor FSBS 1x daily. Dx: E11.9. ?Patient not taking: Reported on 11/30/2020 06/19/14   Alycia Rossetti, MD  ?hydrocortisone (PROCTOCORT) 1 % CREA Apply to rectal area BID PRN for pain. Please dispense rectal tip. ?Patient not taking: Reported on 11/30/2020 05/11/17   Alycia Rossetti, MD  ?lamoTRIgine (LAMICTAL) 25 MG tablet 1 tablet (25 mg total) by G-tube route Two (2) times a day. 09/09/19   [provider]  ?latanoprost (XALATAN) 0.005 % ophthalmic solution Administer 1 drop to both eyes nightly. 01/23/17   [provider]  ?loratadine (CLARITIN) 10 MG tablet Take 1 tablet (10 mg total) by mouth daily as needed for allergies. ?Patient not taking: Reported on 11/30/2020 05/11/17   Alycia Rossetti, MD  ?methimazole (TAPAZOLE) 5 MG tablet TAKE (1) TABLET BY MOUTH ONCE DAILY. 05/14/18   Alycia Rossetti, MD  ?Nutritional Supplements (FEEDING SUPPLEMENT, JEVITY 1.2 CAL,) LIQD Place 1,000 mLs into feeding tube continuous.    [provider]  ?OLANZapine (ZYPREXA) 5 MG tablet TAKE 1 TABLET BY MOUTH AT BEDTIME. 04/15/18   [place], Modena Nunnery, MD  ?perphenazine (TRILAFON) 2 MG tablet TAKE (1) TABLET BY MOUTH ONCE DAILY. ?Patient not taking: Reported on 11/30/2020 04/15/18   Alycia Rossetti, MD  ?polyethylene  glycol powder (GLYCOLAX/MIRALAX) powder Take 17 g by mouth 2 (two) times daily as needed. ?Patient taking differently: Take 17 g by mouth 2 (two) times daily as needed for mild constipation. 09/04/17   Alycia Rossetti, MD  ?sertraline (ZOLOFT) 50 MG tablet Take 50 mg by mouth daily. 11/27/20   [provider]  ?timolol (TIMOPTIC) 0.5 % ophthalmic solution INSTILL 1 DROP INTO RIGHT EYE EVERY MORNING 05/20/18   [place], Modena Nunnery, MD  ?topiramate (TOPAMAX) 50 MG tablet Take 1 tablet (50 mg total) by mouth 3 (three)  times daily. ?Patient not taking: Reported on 11/30/2020 08/08/18   Alycia Rossetti, MD  ?traMADol (ULTRAM) 50 MG tablet Take 1 tablet (50 mg total) by mouth every 12 (twelve) hours as needed. ?Patient not taking: Reported on 11/30/2020 05/08/17   Alycia Rossetti, MD  ?   ? ?Allergies    ?Codeine   ? ?Review of Systems   ?Review of Systems ? ?Physical Exam ?Updated Vital Signs ?BP 122/61   Pulse 69   Temp 98 ?F (36.7 ?C) (Oral)   Resp 16   Wt 56.7 kg   SpO2 100%   BMI 24.41 kg/m?  ?Physical Exam ?Vitals and nursing note reviewed.  ?Constitutional:   ?   General: She is not in acute distress. ?   Appearance: She is well-developed. She is not ill-appearing.  ?HENT:  ?   Head: Normocephalic and atraumatic.  ?   Right Ear: External ear normal.  ?   Left Ear: External ear normal.  ?Eyes:  ?   Conjunctiva/sclera: Conjunctivae normal.  ?   Pupils: Pupils are equal, round, and reactive to light.  ?Neck:  ?   Trachea: Phonation normal.  ?Cardiovascular:  ?   Rate and Rhythm: Normal rate.  ?Pulmonary:  ?   Effort: Pulmonary effort is normal.  ?Abdominal:  ?   General: There is no distension.  ?   Tenderness: There is no abdominal tenderness.  ?   Comments: Abdomen is soft and nontender.  There is an ostomy in the left upper quadrant, which appears open, and is without bleeding or drainage.  There is no surrounding swelling.  ?Musculoskeletal:     ?   General: No swelling or tenderness.  ?   Cervical back: Normal range of motion and neck supple.  ?Skin: ?   General: Skin is warm and dry.  ?Neurological:  ?   Mental Status: She is alert.  ?   Cranial Nerves: No cranial nerve deficit.  ?   Motor: No abnormal muscle tone.  ?   Coordination: Coordination normal.  ?   Comments: Confused, resists examination, using both arms.  She does not verbalize.  ?Psychiatric:  ?   Comments: Somewhat agitated during examination.  She is confused  ? ? ?ED Results / Procedures / Treatments   ?Labs ?(all labs ordered are listed, but only  abnormal results are displayed) ?Labs Reviewed - No data to display ? ?EKG ?None ? ?Radiology ?DG ABDOMEN PEG TUBE LOCATION ? ?Result Date: 06/19/2021 ?CLINICAL DATA:  G-tube replacement after being pulled out. EXAM: ABDOMEN - 1 VIEW COMPARISON:  Earlier same day and multiple films back to 10/16/2020 FINDINGS: Contrast material has exited the stomach and is distributed throughout nondilated small bowel. The barium of concern on the film from earlier today persists but on this exam has a clearly geometric pattern consistent with contamination of gauze or bandage material external to the patient, likely at the skin site  of the G-tube. IMPRESSION: 1. NG tube tip is confirmed to be in the lumen of the distal stomach. Contrast material has exited the stomach and is distributed throughout nondilated small bowel. No evidence for contrast extravasation. 2. The area of concern on the film from earlier today is seen to clearly represent barium contamination of bandage or gauze material. Results discussed with Dr. Eulis Foster at approximately 1143 hours on 06/19/2021. Electronically Signed   By: Misty Stanley M.D.   On: 06/19/2021 11:43  ? ?DG ABDOMEN PEG TUBE LOCATION ? ?Result Date: 06/19/2021 ?CLINICAL DATA:  Gastrostomy tube tip EXAM: ABDOMEN - 1 VIEW COMPARISON:  05/14/2021 FINDINGS: Supine abdomen obtained 1037 hours. Contrast material is been injected through the patient's gastrostomy tube. Contrast opacifies the gastric lumen confirming intraluminal placement of the G-tube tip. There is some contrast medial to the gastric antrum that could be within the duodenal lumen or external to the patient. This does not have a characteristic appearance for extravasated contrast free in the peritoneal cavity. IMPRESSION: Gastrostomy tube tip position within the lumen of the stomach. Contrast seen medial to the pylorus may be in the duodenum or external to the patient. This does not have characteristic appearance for free spill of contrast  into the peritoneal cavity. If there is clinical concern for contrast leak, CT imaging could be used to further evaluate. Electronically Signed   By: Misty Stanley M.D.   On: 06/19/2021 10:56   ? ?Procedures ?Gastrostomy

## 2021-06-19 NOTE — ED Notes (Signed)
[facility] has been contacted for transport. ? ?

## 2021-07-05 ENCOUNTER — Other Ambulatory Visit: Payer: Self-pay

## 2021-07-05 ENCOUNTER — Ambulatory Visit (INDEPENDENT_AMBULATORY_CARE_PROVIDER_SITE_OTHER): Payer: Medicare Other | Admitting: General Surgery

## 2021-07-05 ENCOUNTER — Encounter: Payer: Self-pay | Admitting: General Surgery

## 2021-07-05 VITALS — BP 148/80 | HR 72 | Temp 98.2°F | Ht 60.0 in | Wt 113.0 lb

## 2021-07-05 DIAGNOSIS — Z934 Other artificial openings of gastrointestinal tract status: Secondary | ICD-10-CM | POA: Diagnosis not present

## 2021-07-05 NOTE — Progress Notes (Signed)
Patient was referred to my care for evaluation of a feeding tube that was present.  Patient came by ambulance and is noncommunicative.  History obtained from the records.  Patient apparently has had multiple visits to the emergency room at [facility] for displacement of this feeding tube.  She was referred to my care for gastrostomy tube replacement.  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  Replacement of this needs to be performed by interventional radiology.  I called the nursing home and told them that this needs to be performed by interventional radiology in [place].  I have placed an order for this to occur. ?

## 2021-07-27 ENCOUNTER — Other Ambulatory Visit (HOSPITAL_COMMUNITY): Payer: Self-pay | Admitting: General Surgery

## 2021-07-27 DIAGNOSIS — Z934 Other artificial openings of gastrointestinal tract status: Secondary | ICD-10-CM

## 2021-07-28 ENCOUNTER — Telehealth (HOSPITAL_COMMUNITY): Payer: Self-pay

## 2021-07-28 NOTE — Telephone Encounter (Signed)
Returned call to Amy at [facility]. We need info on what size Jtube the patient has so that we can make sure we have the right tube in stock. Left vm for her to call me or Anderson Malta back. AW

## 2021-08-10 ENCOUNTER — Ambulatory Visit (HOSPITAL_COMMUNITY)
Admission: RE | Admit: 2021-08-10 | Discharge: 2021-08-10 | Disposition: A | Discharge status: Home / Self Care | Payer: Medicare Other | Source: Ambulatory Visit | Attending: General Surgery | Admitting: General Surgery

## 2021-08-10 DIAGNOSIS — Z431 Encounter for attention to gastrostomy: Secondary | ICD-10-CM | POA: Insufficient documentation

## 2021-08-10 DIAGNOSIS — Z934 Other artificial openings of gastrointestinal tract status: Secondary | ICD-10-CM

## 2021-08-10 HISTORY — PX: IR REPLC GASTRO/COLONIC TUBE PERCUT W/FLUORO: IMG2333

## 2021-08-10 MED ORDER — LIDOCAINE VISCOUS HCL 2 % MT SOLN
OROMUCOSAL | Status: AC
  Filled 2021-08-10: qty 15

## 2021-08-10 MED ORDER — STERILE WATER FOR INJECTION IJ SOLN
INTRAMUSCULAR | Status: AC
  Filled 2021-08-10: qty 10

## 2021-08-10 MED ORDER — IOHEXOL 300 MG/ML  SOLN
100.0000 mL | Freq: Once | INTRAMUSCULAR | Status: AC | PRN
  Administered 2021-08-10: 25 mL

## 2023-01-12 ENCOUNTER — Other Ambulatory Visit (HOSPITAL_COMMUNITY): Payer: Self-pay | Admitting: Student

## 2023-01-12 DIAGNOSIS — R633 Feeding difficulties, unspecified: Secondary | ICD-10-CM

## 2023-01-15 ENCOUNTER — Ambulatory Visit (HOSPITAL_COMMUNITY)
Admission: RE | Admit: 2023-01-15 | Discharge: 2023-01-15 | Disposition: A | Discharge status: Home / Self Care | Payer: Medicare Other | Source: Ambulatory Visit | Attending: Student | Admitting: Student

## 2023-01-15 DIAGNOSIS — Z431 Encounter for attention to gastrostomy: Secondary | ICD-10-CM | POA: Insufficient documentation

## 2023-01-15 DIAGNOSIS — R633 Feeding difficulties, unspecified: Secondary | ICD-10-CM | POA: Insufficient documentation

## 2023-01-15 HISTORY — PX: IR GJ TUBE CHANGE: IMG1440

## 2023-01-15 MED ORDER — IOHEXOL 300 MG/ML  SOLN
50.0000 mL | Freq: Once | INTRAMUSCULAR | Status: AC | PRN
  Administered 2023-01-15: 24 mL

## 2023-01-15 MED ORDER — LIDOCAINE VISCOUS HCL 2 % MT SOLN
15.0000 mL | OROMUCOSAL | Status: DC | PRN
  Administered 2023-01-15: 10 mL via OROMUCOSAL

## 2023-01-15 MED ORDER — LIDOCAINE VISCOUS HCL 2 % MT SOLN
OROMUCOSAL | Status: AC
  Filled 2023-01-15: qty 15

## 2023-01-15 NOTE — Procedures (Signed)
Interventional Radiology Procedure:   Indications: Dislodged GJ feeding tube  Procedure: Replacement of GJ feeding tube with fluoroscopy  Findings: New 20 Fr GJ tube placed through existing stomach access.  Tip in jejunum  Complications: None     EBL: Minimal  Plan: GJ tube is ready to be used.   Sergi Gellner R. Lowella Dandy, MD  Pager: [phone]

## 2023-02-02 ENCOUNTER — Telehealth: Payer: Self-pay | Admitting: Student

## 2023-02-02 NOTE — Telephone Encounter (Signed)
Message received that Amy RN from [facility] stating that patient's GJ tube is clogged and she was not able to unclog it, patient may need exchange.   Spoke with [facility] and [facility] IR, both do not have 20 Fr GJ.  Called Amy back to inform, unable to reach, left VM stating that the earliest we can perform GJ exchange as outpatient will be next Monday. Asked Amy to fax the order to [facility] IR at [phone].   After further discussion with IR radiologist, decision was made to attempt GJ exchange with 22 Fr if patient and family give permission.   [employer] called back twice, was not able to reach Amy or her voice message box to inform or leave VM that we can possibly schedule her for GJ exchange today. No order received at 1434 hrs, currently cannot schedule the patient for any procedure.   Please call IR for questions and concerns.   Lynann Bologna Buell Parcel PA-C 02/02/2023 2:34 PM

## 2023-02-15 ENCOUNTER — Telehealth (HOSPITAL_COMMUNITY): Payer: Self-pay

## 2023-02-15 NOTE — Telephone Encounter (Signed)
Left message for Amy at [facility] to call back. Still haven't received an order for her peg exchange so that I can schedule. AB

## 2023-02-19 ENCOUNTER — Other Ambulatory Visit (HOSPITAL_COMMUNITY): Payer: Self-pay | Admitting: Internal Medicine

## 2023-02-19 DIAGNOSIS — Z431 Encounter for attention to gastrostomy: Secondary | ICD-10-CM

## 2023-02-21 ENCOUNTER — Other Ambulatory Visit (HOSPITAL_COMMUNITY): Payer: Self-pay | Admitting: Internal Medicine

## 2023-02-21 ENCOUNTER — Ambulatory Visit (HOSPITAL_COMMUNITY)
Admission: RE | Admit: 2023-02-21 | Discharge: 2023-02-21 | Disposition: A | Discharge status: Home / Self Care | Payer: Medicare Other | Source: Ambulatory Visit | Attending: Internal Medicine | Admitting: Internal Medicine

## 2023-02-21 DIAGNOSIS — K9423 Gastrostomy malfunction: Secondary | ICD-10-CM | POA: Diagnosis present

## 2023-02-21 DIAGNOSIS — Z431 Encounter for attention to gastrostomy: Secondary | ICD-10-CM

## 2023-02-21 HISTORY — PX: IR CM INJ ANY COLONIC TUBE W/FLUORO: IMG2336

## 2023-02-21 HISTORY — PX: IR MECH REMOV OBSTRUC MAT ANY COLON TUBE W/FLUORO: IMG2335

## 2023-02-21 MED ORDER — IOHEXOL 300 MG/ML  SOLN: 50.0000 mL | Freq: Once | INTRAMUSCULAR | Status: DC | PRN

## 2023-02-21 MED ORDER — LIDOCAINE VISCOUS HCL 2 % MT SOLN
OROMUCOSAL | Status: AC
  Filled 2023-02-21: qty 15

## 2023-02-21 NOTE — Procedures (Signed)
Pre procedural Dx: Dysphagia, Clogged G-tube Post procedural Dx: Same  Successful beside declogging of pre-existing gastrostomy tube.   Contrast injection confirms appropriate positioning and functionality. The feeding tube is ready for immediate use.  EBL: None  Complications: None immediate.  Katherina Right, MD Pager #: [phone]

## 2023-02-25 ENCOUNTER — Other Ambulatory Visit (HOSPITAL_COMMUNITY): Payer: Self-pay | Admitting: Internal Medicine

## 2023-02-25 ENCOUNTER — Encounter (HOSPITAL_COMMUNITY): Payer: Self-pay | Admitting: Radiology

## 2023-02-25 DIAGNOSIS — Z431 Encounter for attention to gastrostomy: Secondary | ICD-10-CM

## 2023-09-11 DEATH — deceased
